# Patient Record
Sex: Female | Born: 1970 | Race: Black or African American | Hispanic: No | Marital: Married | Smoking: Former smoker
Health system: Southern US, Community
[De-identification: ages and names within clinical notes are randomized; demographics above are authoritative.]

## PROBLEM LIST (undated history)

## (undated) ENCOUNTER — Inpatient Hospital Stay (HOSPITAL_COMMUNITY): Payer: Self-pay

## (undated) DIAGNOSIS — I8390 Asymptomatic varicose veins of unspecified lower extremity: Secondary | ICD-10-CM

## (undated) DIAGNOSIS — F32A Depression, unspecified: Secondary | ICD-10-CM

## (undated) DIAGNOSIS — F329 Major depressive disorder, single episode, unspecified: Secondary | ICD-10-CM

## (undated) DIAGNOSIS — Z8719 Personal history of other diseases of the digestive system: Secondary | ICD-10-CM

## (undated) DIAGNOSIS — G4730 Sleep apnea, unspecified: Secondary | ICD-10-CM

## (undated) DIAGNOSIS — K219 Gastro-esophageal reflux disease without esophagitis: Secondary | ICD-10-CM

## (undated) DIAGNOSIS — E559 Vitamin D deficiency, unspecified: Secondary | ICD-10-CM

## (undated) DIAGNOSIS — M1990 Unspecified osteoarthritis, unspecified site: Secondary | ICD-10-CM

## (undated) DIAGNOSIS — O139 Gestational [pregnancy-induced] hypertension without significant proteinuria, unspecified trimester: Secondary | ICD-10-CM

## (undated) DIAGNOSIS — D649 Anemia, unspecified: Secondary | ICD-10-CM

## (undated) HISTORY — DX: Vitamin D deficiency, unspecified: E55.9

## (undated) HISTORY — DX: Anemia, unspecified: D64.9

## (undated) HISTORY — PX: HERNIA REPAIR: SHX51

## (undated) HISTORY — PX: TOE SURGERY: SHX1073

## (undated) HISTORY — DX: Asymptomatic varicose veins of unspecified lower extremity: I83.90

## (undated) HISTORY — PX: KNEE SURGERY: SHX244

---

## 1997-11-17 ENCOUNTER — Inpatient Hospital Stay (HOSPITAL_COMMUNITY): Admission: AD | Admit: 1997-11-17 | Discharge: 1997-11-17 | Payer: Self-pay | Admitting: *Deleted

## 1999-06-01 ENCOUNTER — Encounter: Admission: RE | Admit: 1999-06-01 | Discharge: 1999-06-01 | Payer: Self-pay | Admitting: Obstetrics

## 1999-08-15 ENCOUNTER — Inpatient Hospital Stay (HOSPITAL_COMMUNITY): Admission: AD | Admit: 1999-08-15 | Discharge: 1999-08-15 | Payer: Self-pay | Admitting: *Deleted

## 1999-08-30 ENCOUNTER — Other Ambulatory Visit: Admission: RE | Admit: 1999-08-30 | Discharge: 1999-08-30 | Payer: Self-pay | Admitting: Obstetrics and Gynecology

## 1999-12-12 ENCOUNTER — Encounter (INDEPENDENT_AMBULATORY_CARE_PROVIDER_SITE_OTHER): Payer: Self-pay | Admitting: Specialist

## 1999-12-12 ENCOUNTER — Other Ambulatory Visit: Admission: RE | Admit: 1999-12-12 | Discharge: 1999-12-12 | Payer: Self-pay | Admitting: Obstetrics and Gynecology

## 2000-05-21 ENCOUNTER — Other Ambulatory Visit: Admission: RE | Admit: 2000-05-21 | Discharge: 2000-05-21 | Payer: Self-pay | Admitting: Obstetrics and Gynecology

## 2001-07-24 ENCOUNTER — Emergency Department (HOSPITAL_COMMUNITY): Admission: EM | Admit: 2001-07-24 | Discharge: 2001-07-24 | Payer: Self-pay | Admitting: Emergency Medicine

## 2001-08-29 ENCOUNTER — Encounter: Payer: Self-pay | Admitting: Emergency Medicine

## 2001-08-29 ENCOUNTER — Emergency Department (HOSPITAL_COMMUNITY): Admission: EM | Admit: 2001-08-29 | Discharge: 2001-08-29 | Payer: Self-pay | Admitting: Emergency Medicine

## 2002-07-30 ENCOUNTER — Ambulatory Visit (HOSPITAL_COMMUNITY): Admission: RE | Admit: 2002-07-30 | Discharge: 2002-07-30 | Payer: Self-pay | Admitting: Obstetrics & Gynecology

## 2002-07-30 ENCOUNTER — Encounter: Payer: Self-pay | Admitting: Obstetrics & Gynecology

## 2005-05-01 ENCOUNTER — Other Ambulatory Visit: Admission: RE | Admit: 2005-05-01 | Discharge: 2005-05-01 | Payer: Self-pay | Admitting: Obstetrics and Gynecology

## 2005-05-09 ENCOUNTER — Ambulatory Visit: Payer: Self-pay | Admitting: Internal Medicine

## 2005-05-14 ENCOUNTER — Ambulatory Visit: Payer: Self-pay | Admitting: Internal Medicine

## 2006-07-30 ENCOUNTER — Ambulatory Visit: Payer: Self-pay | Admitting: Internal Medicine

## 2006-07-30 LAB — CONVERTED CEMR LAB
AST: 22 units/L (ref 0–37)
Albumin: 3.5 g/dL (ref 3.5–5.2)
Basophils Absolute: 0.1 10*3/uL (ref 0.0–0.1)
Chloride: 108 meq/L (ref 96–112)
Cholesterol: 200 mg/dL (ref 0–200)
Creatinine, Ser: 0.7 mg/dL (ref 0.4–1.2)
Eosinophils Relative: 1.5 % (ref 0.0–5.0)
Glucose, Bld: 90 mg/dL (ref 70–99)
HCT: 40.2 % (ref 36.0–46.0)
Neutrophils Relative %: 58 % (ref 43.0–77.0)
RBC: 4.19 M/uL (ref 3.87–5.11)
RDW: 12.1 % (ref 11.5–14.6)
Sodium: 141 meq/L (ref 135–145)
Total Bilirubin: 0.6 mg/dL (ref 0.3–1.2)
WBC: 10.4 10*3/uL (ref 4.5–10.5)

## 2007-03-03 ENCOUNTER — Ambulatory Visit: Payer: Self-pay

## 2007-03-03 ENCOUNTER — Ambulatory Visit: Payer: Self-pay | Admitting: Internal Medicine

## 2007-03-03 DIAGNOSIS — I8000 Phlebitis and thrombophlebitis of superficial vessels of unspecified lower extremity: Secondary | ICD-10-CM | POA: Insufficient documentation

## 2007-04-28 ENCOUNTER — Ambulatory Visit: Payer: Self-pay | Admitting: Vascular Surgery

## 2007-09-09 ENCOUNTER — Ambulatory Visit: Payer: Self-pay | Admitting: Vascular Surgery

## 2008-08-17 ENCOUNTER — Emergency Department (HOSPITAL_COMMUNITY): Admission: EM | Admit: 2008-08-17 | Discharge: 2008-08-17 | Payer: Self-pay | Admitting: Emergency Medicine

## 2008-08-21 ENCOUNTER — Emergency Department (HOSPITAL_COMMUNITY): Admission: EM | Admit: 2008-08-21 | Discharge: 2008-08-21 | Payer: Self-pay | Admitting: Family Medicine

## 2008-08-21 ENCOUNTER — Encounter (INDEPENDENT_AMBULATORY_CARE_PROVIDER_SITE_OTHER): Payer: Self-pay | Admitting: *Deleted

## 2008-08-22 ENCOUNTER — Inpatient Hospital Stay (HOSPITAL_COMMUNITY): Admission: EM | Admit: 2008-08-22 | Discharge: 2008-08-25 | Payer: Self-pay | Admitting: Emergency Medicine

## 2008-08-24 ENCOUNTER — Encounter (INDEPENDENT_AMBULATORY_CARE_PROVIDER_SITE_OTHER): Payer: Self-pay | Admitting: *Deleted

## 2008-08-25 ENCOUNTER — Encounter (INDEPENDENT_AMBULATORY_CARE_PROVIDER_SITE_OTHER): Payer: Self-pay | Admitting: *Deleted

## 2008-09-23 ENCOUNTER — Ambulatory Visit: Payer: Self-pay | Admitting: Internal Medicine

## 2008-09-24 DIAGNOSIS — E876 Hypokalemia: Secondary | ICD-10-CM

## 2008-09-24 DIAGNOSIS — F17200 Nicotine dependence, unspecified, uncomplicated: Secondary | ICD-10-CM | POA: Insufficient documentation

## 2008-09-24 DIAGNOSIS — A0472 Enterocolitis due to Clostridium difficile, not specified as recurrent: Secondary | ICD-10-CM | POA: Insufficient documentation

## 2009-02-28 ENCOUNTER — Emergency Department (HOSPITAL_COMMUNITY): Admission: EM | Admit: 2009-02-28 | Discharge: 2009-02-28 | Payer: Self-pay | Admitting: Emergency Medicine

## 2009-06-01 ENCOUNTER — Emergency Department (HOSPITAL_COMMUNITY): Admission: EM | Admit: 2009-06-01 | Discharge: 2009-06-01 | Payer: Self-pay | Admitting: Emergency Medicine

## 2010-06-26 LAB — CBC
HCT: 43.7 % (ref 36.0–46.0)
MCHC: 33.5 g/dL (ref 30.0–36.0)
Platelets: 212 10*3/uL (ref 150–400)
RDW: 13.2 % (ref 11.5–15.5)

## 2010-06-26 LAB — POCT I-STAT, CHEM 8
Calcium, Ion: 1.14 mmol/L (ref 1.12–1.32)
Chloride: 108 mEq/L (ref 96–112)
HCT: 45 % (ref 36.0–46.0)
Hemoglobin: 15.3 g/dL — ABNORMAL HIGH (ref 12.0–15.0)
TCO2: 27 mmol/L (ref 0–100)

## 2010-07-11 LAB — DIFFERENTIAL
Basophils Absolute: 0 10*3/uL (ref 0.0–0.1)
Basophils Absolute: 0 10*3/uL (ref 0.0–0.1)
Basophils Relative: 0 % (ref 0–1)
Basophils Relative: 0 % (ref 0–1)
Basophils Relative: 0 % (ref 0–1)
Eosinophils Absolute: 0 10*3/uL (ref 0.0–0.7)
Eosinophils Absolute: 0.1 10*3/uL (ref 0.0–0.7)
Eosinophils Absolute: 0.1 10*3/uL (ref 0.0–0.7)
Eosinophils Relative: 1 % (ref 0–5)
Eosinophils Relative: 1 % (ref 0–5)
Lymphocytes Relative: 18 % (ref 12–46)
Lymphs Abs: 1.9 10*3/uL (ref 0.7–4.0)
Lymphs Abs: 1.6 10*3/uL (ref 0.7–4.0)
Lymphs Abs: 1.8 10*3/uL (ref 0.7–4.0)
Monocytes Absolute: 0.2 10*3/uL (ref 0.1–1.0)
Monocytes Absolute: 0.2 10*3/uL (ref 0.1–1.0)
Monocytes Absolute: 0.7 10*3/uL (ref 0.1–1.0)
Monocytes Relative: 3 % (ref 3–12)
Monocytes Relative: 2 % — ABNORMAL LOW (ref 3–12)
Neutro Abs: 7.8 10*3/uL — ABNORMAL HIGH (ref 1.7–7.7)
Neutrophils Relative %: 77 % (ref 43–77)
Neutrophils Relative %: 78 % — ABNORMAL HIGH (ref 43–77)

## 2010-07-11 LAB — BASIC METABOLIC PANEL
CO2: 23 mEq/L (ref 19–32)
CO2: 24 mEq/L (ref 19–32)
CO2: 26 mEq/L (ref 19–32)
Calcium: 8.2 mg/dL — ABNORMAL LOW (ref 8.4–10.5)
Chloride: 106 mEq/L (ref 96–112)
Chloride: 110 mEq/L (ref 96–112)
Creatinine, Ser: 0.56 mg/dL (ref 0.4–1.2)
GFR calc Af Amer: >60 mL/min (ref >60)
GFR calc Af Amer: >60 mL/min (ref >60)
GFR calc Af Amer: >60 mL/min (ref >60)
GFR calc non Af Amer: >60 mL/min (ref >60)
Glucose, Bld: 99 mg/dL (ref 70–99)
Potassium: 3 mEq/L — ABNORMAL LOW (ref 3.5–5.1)
Potassium: 4.3 mEq/L (ref 3.5–5.1)
Sodium: 139 mEq/L (ref 135–145)
Sodium: 139 mEq/L (ref 135–145)
Sodium: 142 mEq/L (ref 135–145)

## 2010-07-11 LAB — POCT PREGNANCY, URINE: Preg Test, Ur: NEGATIVE

## 2010-07-11 LAB — PROTIME-INR
INR: 1.1 (ref 0.00–1.49)
Prothrombin Time: 14.5 seconds (ref 11.6–15.2)

## 2010-07-11 LAB — POTASSIUM
Potassium: 2.8 mEq/L — ABNORMAL LOW (ref 3.5–5.1)
Potassium: 3.2 mEq/L — ABNORMAL LOW (ref 3.5–5.1)

## 2010-07-11 LAB — COMPREHENSIVE METABOLIC PANEL
ALT: 17 U/L (ref 0–35)
ALT: 13 U/L (ref 0–35)
AST: 18 U/L (ref 0–37)
AST: 17 U/L (ref 0–37)
Albumin: 3.4 g/dL — ABNORMAL LOW (ref 3.5–5.2)
Albumin: 2.4 g/dL — ABNORMAL LOW (ref 3.5–5.2)
Alkaline Phosphatase: 70 U/L (ref 39–117)
Alkaline Phosphatase: 58 U/L (ref 39–117)
Calcium: 8.7 mg/dL (ref 8.4–10.5)
Chloride: 101 mEq/L (ref 96–112)
GFR calc Af Amer: >60 mL/min (ref >60)
GFR calc Af Amer: >60 mL/min (ref >60)
Potassium: 3.4 mEq/L — ABNORMAL LOW (ref 3.5–5.1)
Potassium: 2.4 mEq/L — CL (ref 3.5–5.1)
Sodium: 138 mEq/L (ref 135–145)
Total Bilirubin: 0.7 mg/dL (ref 0.3–1.2)
Total Protein: 6.1 g/dL (ref 6.0–8.3)

## 2010-07-11 LAB — URINALYSIS, ROUTINE W REFLEX MICROSCOPIC
Glucose, UA: NEGATIVE mg/dL
Hgb urine dipstick: NEGATIVE
Ketones, ur: 15 mg/dL — AB
Leukocytes, UA: NEGATIVE
Nitrite: NEGATIVE
Protein, ur: NEGATIVE mg/dL
Urobilinogen, UA: 0.2 mg/dL (ref 0.0–1.0)
pH: 5.5 (ref 5.0–8.0)
pH: 5.5 (ref 5.0–8.0)

## 2010-07-11 LAB — URINE MICROSCOPIC-ADD ON

## 2010-07-11 LAB — RAPID URINE DRUG SCREEN, HOSP PERFORMED
Benzodiazepines: NOT DETECTED
Cocaine: NOT DETECTED
Tetrahydrocannabinol: NOT DETECTED

## 2010-07-11 LAB — LIPID PANEL
Cholesterol: 116 mg/dL (ref 0–200)
HDL: 27 mg/dL — ABNORMAL LOW (ref >39)
LDL Cholesterol: 77 mg/dL (ref 0–99)
Triglycerides: 62 mg/dL (ref <150)

## 2010-07-11 LAB — CBC
HCT: 35.9 % — ABNORMAL LOW (ref 36.0–46.0)
HCT: 37.2 % (ref 36.0–46.0)
Hemoglobin: 14.5 g/dL (ref 12.0–15.0)
Hemoglobin: 12.4 g/dL (ref 12.0–15.0)
Hemoglobin: 12.8 g/dL (ref 12.0–15.0)
MCHC: 34 g/dL (ref 30.0–36.0)
MCHC: 34.5 g/dL (ref 30.0–36.0)
MCV: 96.2 fL (ref 78.0–100.0)
Platelets: 218 10*3/uL (ref 150–400)
Platelets: 209 10*3/uL (ref 150–400)
Platelets: 247 10*3/uL (ref 150–400)
RBC: 3.57 MIL/uL — ABNORMAL LOW (ref 3.87–5.11)
RBC: 3.73 MIL/uL — ABNORMAL LOW (ref 3.87–5.11)
RBC: 3.86 MIL/uL — ABNORMAL LOW (ref 3.87–5.11)
RDW: 13.7 % (ref 11.5–15.5)
RDW: 13.2 % (ref 11.5–15.5)
WBC: 10.1 10*3/uL (ref 4.0–10.5)
WBC: 8.3 10*3/uL (ref 4.0–10.5)
WBC: 8.4 10*3/uL (ref 4.0–10.5)

## 2010-07-11 LAB — POCT I-STAT, CHEM 8
Creatinine, Ser: 0.9 mg/dL (ref 0.4–1.2)
HCT: 42 % (ref 36.0–46.0)
Hemoglobin: 14.3 g/dL (ref 12.0–15.0)
Sodium: 137 mEq/L (ref 135–145)
TCO2: 27 mmol/L (ref 0–100)

## 2010-07-11 LAB — STOOL CULTURE

## 2010-07-11 LAB — TSH: TSH: 0.765 u[IU]/mL (ref 0.350–4.500)

## 2010-07-11 LAB — URINE CULTURE: Colony Count: 70000

## 2010-07-11 LAB — GIARDIA/CRYPTOSPORIDIUM SCREEN(EIA): Giardia Screen - EIA: NEGATIVE

## 2010-07-11 LAB — FECAL LACTOFERRIN, QUANT: Fecal Lactoferrin: POSITIVE

## 2010-07-11 LAB — APTT: aPTT: 25 seconds (ref 24–37)

## 2010-07-11 LAB — CLOSTRIDIUM DIFFICILE EIA

## 2010-08-15 NOTE — Procedures (Signed)
LOWER EXTREMITY VENOUS REFLUX EXAM   INDICATION:  Left leg varicose veins, pain and edema.   EXAM:  Using color-flow imaging and pulse Doppler spectral analysis, the  left common femoral, superficial femoral, popliteal, posterior tibial,  greater and lesser saphenous veins are evaluated.  There is no evidence  suggesting deep venous insufficiency in the left lower extremity.   The left saphenofemoral junction is not competent.  The left GSV is  competent with the caliber as described below.   The left proximal short saphenous vein demonstrates incompetency.   SAPHENOFEMORAL JUNCTION DIAMETER LEFT:  0.74 cm   GSV Diameter (used if found to be incompetent only)                                            Right    Left  Proximal Greater Saphenous Vein           cm       0.43 cm  Proximal-to-mid-thigh                     cm       0.46 cm  Mid thigh                                 cm       0.50 cm  Mid-distal thigh                          cm       0.35 (depth 1.68) cm  Distal thigh                              cm       0.49 (depth 2.39) cm  Knee                                      cm       0.49 cm   IMPRESSION:  1. Left greater saphenous vein reflux is identified with the caliber      ranging from 0.49 cm to 0.74 cm knee to groin.  2. The left greater saphenous vein is not aneurysmal.  3. The left greater saphenous vein is not tortuous.  4. The deep venous system is competent.  5. The left lesser saphenous vein is not competent.  6. Thrombosed varicosities were detected in the proximal thigh at the      area of discomfort.   ___________________________________________  Quita Skye. Hart Rochester, M.D.   DP/MEDQ  D:  04/28/2007  T:  04/29/2007  Job:  [id number]

## 2010-08-15 NOTE — Discharge Summary (Signed)
[name], [name]                ACCOUNT NO.:  [id number]   MEDICAL RECORD NO.:  [id number]          PATIENT TYPE:  INP   LOCATION:  5159                         FACILITY:  [facility]   PHYSICIAN:  Monte Fantasia, MD  DATE OF BIRTH:  [birth year]   DATE OF ADMISSION:  08/21/2008  DATE OF DISCHARGE:  08/25/2008                               DISCHARGE SUMMARY   PRIMARY CARE PHYSICIAN:  Unassigned.  The patient does not have a  primary care physician and insurance, will need to follow up with  [facility].   DISCHARGE DIAGNOSES:  1. Clostridium difficile colitis.  2. Severe hypokalemia.  3. Tobacco abuse.   COURSE DURING THE HOSPITAL STAY:  89. [name] patient has come and was      admitted on Aug 22, 2008, with complaints of nausea, vomiting, and      diarrhea for 10 days and with worsening over the period of time.      The CT scan of the abdomen done in the ED showed thickening of the      sigmoid colon and proximal rectum slight perirectal thickening and      edema more consistent with proctitis, acute colitis, or      inflammatory bowel disease.  The patient's stool C. diff sent on      admission was positive and hence the patient was started on Flagyl      orally for the same.  The diarrhea improved well through the stay      in the hospital, and at present, the patient has no more episodes      of diarrhea.  The patient has been scheduled for a GI appointment      with Dr. Marina Goodell as an outpatient on September 27, 2008, at 2:45 p.m. for      possible colonoscopy.  2. Hypokalemia.  The patient's potassium was significantly low with      values up to 2.8.  The patient was resuscitated with the same.  At      present, the patient's potassium has been 4 and has no more      episodes, can be discharged on potassium 20 mEq daily for 14 days.   LABORATORIES DONE PRIOR TO DISCHARGE:  Total WBC 7.1, hemoglobin 12.4,  hematocrit 35.9, platelets 224.  Sodium 139,  potassium 4.0, chloride  110, bicarb 24, glucose 99, BUN less than 1, creatinine 0.64, calcium of  8.4, magnesium 2.2.  Drug screen has been negative.  UA has been  negative.  Giardia screen negative.  Cryptosporidium is negative.  Stool  for C. Diff, first one was positive on Aug 22, 2008, the second one done  on Aug 23, 2008, was negative.  Urine cultures have been not significant  less than 70,000 colonies of lactobacillus species.  Fecal lactoferrin  is positive.  Moderate yeast in stool cultures.   RADIOLOGICAL INVESTIGATIONS DONE DURING THE STAY IN THE HOSPITAL:  Chest  x-ray done on Aug 21, 2008.  Atelectasis and scarring at the bases.  No  definite active infiltrates.  CT of the  abdomen and pelvis done on  admission on Aug 21, 2008.  Impression, acute colitis, inflammatory  bowel disease, for example ulcerative colitis would be needing  consideration.  CT pelvis, impression consistent with colitis and  proctitis.   DISPOSITION:  The patient at present is medically stable to be  discharged.  We would discharge the patient on Flagyl for a total of 14  days to complete the course.  The patient also has an outpatient  scheduled appointment with Dr. Marina Goodell for possible colonoscopy.  We will  also discharge the patient on potassium p.o. 20 mEq daily for 14 days.   MEDICATIONS UPON DISCHARGE:  1. Metronidazole 500 mg p.o. 3 times a day for 11 days.  2. Potassium chloride 20 mEq p.o. daily for 14 days.   Total time for discharge 35 minutes.      Monte Fantasia, MD  Electronically Signed     MP/MEDQ  D:  08/25/2008  T:  08/26/2008  Job:  [id number]

## 2010-08-15 NOTE — Assessment & Plan Note (Signed)
OFFICE VISIT   ABRAHAM, MARGULIES  DOB:  10-05-1970                                       09/09/2007  CHART#:[id number]   The patient returns today after 3 months for further evaluation of her  venous insufficiency of the left leg.  She has had acute  thrombophlebitis in November of 2008 with thrombosed varicosities and  has had pain and discomfort from the venous hypertension in her left  thigh and calf.  She has also had distal swelling in the left leg over  the last several years.  The elastic compression stockings have not  relieved her aching and throbbing discomfort in the thigh and calf.  She  has tried pain medication (ibuprofen) plus elevation of the legs with no  success.  This is affecting her daily living as, each time she tries to  ambulate, she does have discomfort.  Her venous duplex revealed gross  reflux at the saphenofemoral junction on the left as well as the left  greater saphenous vein from the groin to the knee, and I feel her best  treatment would be laser ablation of her left greater saphenous vein.  I  have discussed this with her and we will proceed with precertification  for the procedure to treat these symptomatic varicosities secondary to  high venous pressures.   Quita Skye Hart Rochester, M.D.  Electronically Signed   JDL/MEDQ  D:  09/09/2007  T:  09/10/2007  Job:  [id number]

## 2010-08-15 NOTE — H&P (Signed)
Renee, [name]                ACCOUNT NO.:  [id number]   MEDICAL RECORD NO.:  [id number]          PATIENT TYPE:  INP   LOCATION:  5159                         FACILITY:  [facility]   PHYSICIAN:  Manus Gunning, MD      DATE OF BIRTH:  1970-04-16   DATE OF ADMISSION:  08/21/2008  DATE OF DISCHARGE:                              HISTORY & PHYSICAL   CHIEF COMPLAINT:  Nausea, vomiting and diarrhea x10 days.   HISTORY OF PRESENT ILLNESS:  [name] is a 40 year old African American  female with the complaint of diarrhea, nausea and vomiting for  approximately 10 days.  She claims that this has been gradually  worsening over a period of time and she is now vomiting at least 3 times  daily.  She also complains of blood associated with the diarrhea upon  wiping after passing stool. She describes no recent history of  antibiotics, no recent travel history, no history of inflammatory bowel  syndrome in the family, no ingestion of unusual foods and no exposure  history.  CT scan of the abdomen was performed in the emergency  department which demonstrated the patient to have thickening of the wall  of the sigmoid colon and proximal rectum, slight perirectal thickening  and edema, no free fluid, most consistent with colitis or proctitis,  acute colitis, possible inflammatory bowel disease.   The patient has been seen in the ER approximately 2 times now and has  been prescribed Cipro as an outpatient despite which she continues to  have symptoms.  She is also complaining of fever at home, measured 100.5  degrees Fahrenheit.  She does not have leukocytosis, white blood cell  count is 10,100.   PAST MEDICAL HISTORY:  None.   PAST SURGICAL HISTORY:  Bilateral first toe reduction.   ALLERGIES:  No known drug allergies.   FAMILY HISTORY:  Mother had diabetes, father had heart disease.   SOCIAL HISTORY:  The patient smokes approximately 1 pack in 4 days,  no  illicits, no alcohol.   HOME  MEDICATIONS:  None.  She was recently prescribed Cipro 500 mg p.o.  b.i.d. which she has been taking.   REVIEW OF SYSTEMS:  Essentially, a 14-point review of systems is  performed.  The patient denies headache, syncope, presyncope, tinnitus,  lightheadedness, odynophagia, no dysphagia.  Positive nausea and  vomiting.  Positive abdominal pain, cramping in nature, 8-10, no  radiation.  No aggravating or relieving factors.  Spontaneous onset and  resolution.  No chest pain, palpitations, PND, orthopnea. No shortness  of breath, cough, expectoration.  Positive diarrhea, no constipation.  Positive bright red blood per rectum, especially during wiping  No  melenic stools.  No dysuria, polyuria or hematuria.  No musculoskeletal  complaints.   PHYSICAL EXAMINATION:  VITAL SIGNS:  At the time of presentation,  temperature 97.9, heart rate 92, respiratory rate 18, blood pressure  132/87, O2 saturation is 97% in room air.  GENERAL:  Well-developed, well-nourished African American lady resting  in bed comfortably in no apparent distress.  HEENT:  Normocephalic, atraumatic.  Moist oral  mucosa.  No thrush,  erythema or postnasal drip.  Eyes anicteric.  Extraocular muscles are  intact.  Pupils are equal, react to light and accommodation.  CARDIOVASCULAR:  S1 and S2 normal, regular rate and rhythm.  No murmurs,  rubs or gallops.  RESPIRATORY:  Air entry bilaterally equal.  No rales, rhonchi or  wheezing appreciated.  ABDOMEN:  Soft and nontender.  Nondistended.  Positive bowel sounds  which are hypoactive.  No organomegaly.  EXTREMITIES:  No clubbing, cyanosis, or edema.  Positive bilateral  dorsalis pedis pulses.  CNS:  Alert and oriented x3.  Cranial nerves II through XII grossly  intact.  Power, sensation and reflexes bilaterally symmetrical.  SKIN:  No breakdown, swelling, ulceration or masses.  HEMATOLOGY/ONCOLOGY:  No palpable lymphadenopathy, ecchymoses, bruising  or petechia.   LABORATORY  DATA:  Potassium 2.8.  Chest x-ray reviewed by self  demonstrates atelectasis, no active infiltrates.  CT of the abdomen and  pelvis as described above. INR 1.1, prothrombin time 14.5. WBC 10.100,  hemoglobin 14.1, hematocrit 41.0 and platelet count 247,000.  Polymorphs  77.  Sodium 137, potassium 2.7, chloride 100, BUN less than 2,  creatinine 0.9, glucose 91, calcium 1.03, pCO2 27, hemoglobin 14.3,  hematocrit 42.  Urinalysis essentially negative.  Urine pregnancy test  negative.   ASSESSMENT AND PLAN:  Colitis.  Apparently gastrointestinal MD is to see  the patient in the emergency department.  At this time, continue Cipro  500 mg p.o. q.12 hours.  Obtain stool studies, ova and parasites, fecal  wbc's, Clostridium difficile and stool culture and sensitivity.  Also  will check hemoglobin and hematocrit at 2 a.m.  If hemoglobin is less  than 8 grams percent, patient is to be transfused 2 units of packed red  blood cells.  She is maintained n.p.o. and started on normal saline at  150 cc/hr.  She may be allowed ice chips and medications.  Prescribed  morphine 2 mg IV q.6 hours for p.r.n. pain and will replete potassium  with 40 mEq IV and 400 cc of normal saline over 4 hours.  Will start  proton pump inhibitor 40 mg, Protonix, IV q.12 hours and place patient  on sequential compression devices for deep vein thrombosis prophylaxis.      Manus Gunning, MD  Electronically Signed     SP/MEDQ  D:  08/21/2008  T:  08/22/2008  Job:  [id number]

## 2010-08-15 NOTE — Consult Note (Signed)
VASCULAR SURGERY CONSULTATION   [name], [name]  DOB:  [birth year]                                       04/28/2007  CHART#:[id number]   Patient is a 40 year old female referred for consultation by Dr.  Amador Cunas for venous insufficiency of the left lower extremity.  This  lady in November, 2008 developed tenderness and knots on the medial  aspect of her left leg which were found to be superficial  thrombophlebitis by duplex scanning.  She had no history of deep venous  thrombosis or previous thrombophlebitis but had noted these knots along  the medial aspect of her left thigh in the past, although they had not  caused any symptoms up to that point.  She has had some mild swelling  distally in the left ankle over the last several years, compared to the  right side.  She has had no thrombotic episodes of deep venous  thrombosis or pulmonary emboli, has not worn elastic compression  stockings, has not tried elevation of the legs but has tried pain  medication, which has helped during her painful episodes.   Past medical history is negative for coronary artery disease, diabetes,  hypertension, hyperlipidemia, stroke, or COPD.   Previous surgery includes bilateral surgery on her toes.   FAMILY HISTORY:  Positive for hypertension and diabetes in her mother.  Negative for coronary artery disease and stroke.   SOCIAL HISTORY:  She is single.  Works as a Dietitian.  Occasionally smokes cigarettes.  She does not use alcohol.   REVIEW OF SYSTEMS:  Some discomfort in her legs with walking and  occasional headaches but otherwise is unremarkable.   ALLERGIES:  None.   MEDICATIONS:  None.   PHYSICAL EXAMINATION:  Blood pressure is 130/70.  Heart rate is 80.  Respirations are 14.  Generally, she is a middle-aged female who is in  no apparent distress.  She is alert and oriented x3.  Neck is supple, 3+  carotid pulses palpable.  No bruits are audible.  Neurologic  exam is  normal.  No palpable adenopathy in the neck.  Chest:  Clear to  auscultation.  Cardiovascular:  Regular rhythm with no murmurs.  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  This has not  changed in character.  Abdomen is soft and nontender with no palpable  masses.  She has 3+ femoral popliteal and dorsalis pedis pulses  palpable.  There is mild distal edema in the left ankle, compared to the  right side.  No ulceration or hyperpigmentations noted.  She does have  palpable, thrombosed varices in the medial thigh along the course of the  greater saphenous vein in the left leg with no varicosities noted on the  right side.   Venous duplex exam was performed in our office today, and there is gross  reflux at the saphenofemoral junction on the left side as well as reflux  in the left greater saphenous vein from the groin to the knee.  She also  has reflux in the left small saphenous vein with some varicosities in  this area.  Deep venous system is widely patent with no evidence of deep  venous  obstruction.   I feel she does have symptomatic venous insufficiency which has caused  this thrombophlebitis in the left leg, and she would be a good candidate  for laser ablation of the left greater saphenous vein to prevent further  episodes of thrombophlebitis and to eliminate the reflux.  We will treat  her initially conservatively with elastic compression stockings (long  leg graduated compression) as well as pain medication and elevation.  She will return in three months for further evaluation, unless she  develops further episodes of thrombophlebitis in the interim.   Quita Skye Hart Rochester, M.D.  Electronically Signed  JDL/MEDQ  D:  04/28/2007  T:  04/29/2007  Job:  [id number]   cc:   Gordy Savers, MD

## 2010-08-18 NOTE — Assessment & Plan Note (Signed)
[facility]                            [facility] OFFICE NOTE   NAME:[name], Renee BURPEE                       MRN:          [id number]  DATE:07/30/2006                            DOB:          06-Apr-1970    A 40 year old African-American female seen today for a wellness exam.  She has seen Dr. Dareen Piano earlier this year for her gynecologic care.  She does quite well.  She complains of occasional lower extremity edema,  has a history of headaches that have not really been bothersome lately.  She is on Chantix and has been off tobacco for a few weeks.   FAMILY HISTORY:  Unchanged.   EXAM:  Reveals an overweight black female.  Weight is down 11 pounds  compared to a year ago.  Blood pressure was down to 110/74 on repeat.  FUNDI, EAR, NOSE AND THROAT:  Clear.  NECK:  Unremarkable.  No adenopathy or thyroid enlargement.  CHEST:  Clear.  CARDIOVASCULAR EXAM:  Normal heart sounds, no murmurs.  ABDOMEN:  Obese, soft and nontender, no organomegaly.  EXTREMITIES:  No significant edema.  Peripheral pulses intact.  NEURO EXAM:  Negative.   IMPRESSION:  1. Exogenous obesity.  2. Otherwise unremarkable clinical exam.   DISPOSITION:  1. Laboratory update will be reviewed.  2. Return in 2 years for followup.     Gordy Savers, MD  Electronically Signed    PFK/MedQ  DD: 07/30/2006  DT: 07/30/2006  Job #: [id number]

## 2011-03-16 ENCOUNTER — Emergency Department (INDEPENDENT_AMBULATORY_CARE_PROVIDER_SITE_OTHER): Payer: Self-pay

## 2011-03-16 ENCOUNTER — Emergency Department (INDEPENDENT_AMBULATORY_CARE_PROVIDER_SITE_OTHER)
Admission: EM | Admit: 2011-03-16 | Discharge: 2011-03-16 | Disposition: A | Discharge status: Home / Self Care | Payer: Self-pay | Source: Home / Self Care | Attending: Family Medicine | Admitting: Family Medicine

## 2011-03-16 DIAGNOSIS — J019 Acute sinusitis, unspecified: Secondary | ICD-10-CM

## 2011-03-16 MED ORDER — AMOXICILLIN-POT CLAVULANATE 875-125 MG PO TABS: 1.0000 | ORAL_TABLET | Freq: Two times a day (BID) | ORAL | Status: AC

## 2011-03-16 MED ORDER — IPRATROPIUM BROMIDE 0.06 % NA SOLN: 1.0000 | Freq: Four times a day (QID) | NASAL | Status: DC

## 2011-03-16 NOTE — ED Provider Notes (Signed)
History     CSN: [id number] Arrival date & time: 03/16/2011 12:12 PM   First MD Initiated Contact with Patient 03/16/11 1208      Chief Complaint  Patient presents with  . Cough  . URI    (Consider location/radiation/quality/duration/timing/severity/associated sxs/prior treatment) Patient is a 40 y.o. female presenting with cough and URI. The history is provided by the patient.  Cough This is a new problem. The current episode started yesterday. The problem has not changed since onset.The cough is productive of purulent sputum. There has been no fever. Associated symptoms include ear congestion, rhinorrhea and sore throat. She has tried decongestants and cough syrup for the symptoms.  URI The primary symptoms include sore throat and cough.  Symptoms associated with the illness include congestion and rhinorrhea.    History reviewed. No pertinent past medical history.  Past Surgical History  Procedure Date  . Foot surgery     No family history on file.  History  Substance Use Topics  . Smoking status: Current Everyday Smoker    Types: Cigarettes  . Smokeless tobacco: Not on file  . Alcohol Use: No    OB History    Grav Para Term Preterm Abortions TAB SAB Ect Mult Living                  Review of Systems  Constitutional: Negative.   HENT: Positive for congestion, sore throat, rhinorrhea and postnasal drip.   Respiratory: Positive for cough.   Skin: Negative.     Allergies  Review of patient's allergies indicates no known allergies.  Home Medications   Current Outpatient Rx  Name Route Sig Dispense Refill  . AMOXICILLIN-POT CLAVULANATE 875-125 MG PO TABS Oral Take 1 tablet by mouth 2 (two) times daily. 20 tablet 0  . IPRATROPIUM BROMIDE 0.06 % NA SOLN Nasal Place 1 spray into the nose 4 (four) times daily. 15 mL 12    BP 148/93  Pulse 98  Temp(Src) 97.9 F (36.6 C) (Oral)  Resp 18  SpO2 98%  LMP 02/28/2011  Physical Exam  Constitutional: She  appears well-developed and well-nourished.  HENT:  Head: Normocephalic.  Right Ear: External ear normal.  Left Ear: External ear normal.  Mouth/Throat: Oropharynx is clear and moist.  Eyes: Conjunctivae and EOM are normal. Pupils are equal, round, and reactive to light.  Neck: Normal range of motion. Neck supple.  Pulmonary/Chest: Effort normal and breath sounds normal.  Abdominal: Soft. Bowel sounds are normal.  Lymphadenopathy:    She has no cervical adenopathy.  Skin: Skin is warm and dry.    ED Course  Procedures (including critical care time)  Labs Reviewed - No data to display No results found.   1. Sinusitis acute       MDM  X-rays reviewed and report per radiologist.         Barkley Bruns, MD 03/16/11 [phone]

## 2011-03-16 NOTE — ED Notes (Signed)
C/o head congestion, green nasal secretions, ears ringing and persistent productive cough of green sputum for 5 weeks.  States she has taken multiple OTC cold meds without relief.  Also notes rash to both lower legs since starting with cold sx

## 2011-08-04 ENCOUNTER — Encounter (HOSPITAL_COMMUNITY): Payer: Self-pay | Admitting: Emergency Medicine

## 2011-08-04 ENCOUNTER — Emergency Department (HOSPITAL_COMMUNITY)
Admission: EM | Admit: 2011-08-04 | Discharge: 2011-08-04 | Disposition: A | Discharge status: Home / Self Care | Payer: Self-pay | Attending: Emergency Medicine | Admitting: Emergency Medicine

## 2011-08-04 DIAGNOSIS — L500 Allergic urticaria: Secondary | ICD-10-CM | POA: Insufficient documentation

## 2011-08-04 DIAGNOSIS — T7840XA Allergy, unspecified, initial encounter: Secondary | ICD-10-CM

## 2011-08-04 DIAGNOSIS — IMO0001 Reserved for inherently not codable concepts without codable children: Secondary | ICD-10-CM | POA: Insufficient documentation

## 2011-08-04 DIAGNOSIS — R0602 Shortness of breath: Secondary | ICD-10-CM | POA: Insufficient documentation

## 2011-08-04 DIAGNOSIS — F17200 Nicotine dependence, unspecified, uncomplicated: Secondary | ICD-10-CM | POA: Insufficient documentation

## 2011-08-04 MED ORDER — DIPHENHYDRAMINE HCL 50 MG/ML IJ SOLN
25.0000 mg | Freq: Once | INTRAMUSCULAR | Status: AC
  Administered 2011-08-04: 25 mg via INTRAVENOUS
  Filled 2011-08-04: qty 1

## 2011-08-04 MED ORDER — FAMOTIDINE IN NACL 20-0.9 MG/50ML-% IV SOLN
20.0000 mg | Freq: Once | INTRAVENOUS | Status: AC
  Administered 2011-08-04: 20 mg via INTRAVENOUS
  Filled 2011-08-04: qty 50

## 2011-08-04 MED ORDER — METHYLPREDNISOLONE SODIUM SUCC 125 MG IJ SOLR
125.0000 mg | Freq: Once | INTRAMUSCULAR | Status: AC
  Administered 2011-08-04: 125 mg via INTRAVENOUS
  Filled 2011-08-04: qty 2

## 2011-08-04 NOTE — ED Notes (Signed)
Patient complaining of an allergic reaction; patient reporting rash/break-out on arms, facial swelling, and shortness of breath.  Patient states that she was outside all day for a church function.  Patient reports having trouble with rash/hives on arms for two years and was told to double her dose of Allegra, but patient states that she has never experience facial swelling and shortness of breath along with the rash.  Patient denies any allergies to food/chemicals.  Denies using new soaps/detergents.

## 2011-08-04 NOTE — Discharge Instructions (Signed)

## 2011-08-04 NOTE — ED Provider Notes (Signed)
Medical screening examination/treatment/procedure(s) were performed by non-physician practitioner and as supervising physician I was immediately available for consultation/collaboration.  Ritta Hammes R. Lavetta Geier, MD 08/04/11 2332 

## 2011-08-04 NOTE — ED Provider Notes (Signed)
History     CSN: [id number]  Arrival date & time 08/04/11  1941   First MD Initiated Contact with Patient 08/04/11 1956      Chief Complaint  Patient presents with  . Allergic Reaction    (Consider location/radiation/quality/duration/timing/severity/associated sxs/prior treatment) HPI Comments: Pt states that she broke out in hives all over and felt sob:pt states that she was outside at a cookout:pt denies any new exposure:pt states that she is not feeling sob at this time:pt states that she has not had a new exposure that she knows of  Patient is a 41 y.o. female presenting with allergic reaction. The history is provided by the patient. No language interpreter was used.  Allergic Reaction The primary symptoms are  shortness of breath and rash. The current episode started 1 to 2 hours ago. The problem has not changed since onset.This is a recurrent problem.    Past Medical History  Diagnosis Date  . No significant past medical history     Past Surgical History  Procedure Date  . Foot surgery     History reviewed. No pertinent family history.  History  Substance Use Topics  . Smoking status: Current Everyday Smoker    Types: Cigarettes  . Smokeless tobacco: Not on file  . Alcohol Use: No    OB History    Grav Para Term Preterm Abortions TAB SAB Ect Mult Living                  Review of Systems  Constitutional: Negative.   Respiratory: Positive for shortness of breath.   Cardiovascular: Negative.   Skin: Positive for rash.  Neurological: Negative.     Allergies  Review of patient's allergies indicates no known allergies.  Home Medications   Current Outpatient Rx  Name Route Sig Dispense Refill  . FEXOFENADINE HCL 180 MG PO TABS Oral Take 180 mg by mouth daily.    . FUROSEMIDE 20 MG PO TABS Oral Take 20 mg by mouth daily.     . ADULT MULTIVITAMIN W/MINERALS CH Oral Take 1 tablet by mouth daily.      BP 148/84  Pulse 100  Temp(Src) 97.9 F (36.6 C)  (Oral)  Resp 20  SpO2 98%  LMP 07/25/2011  Physical Exam  Nursing note and vitals reviewed. Constitutional: She is oriented to person, place, and time. She appears well-developed and well-nourished.  HENT:  Right Ear: External ear normal.  Left Ear: External ear normal.  Mouth/Throat: Oropharynx is clear and moist.       Hives noted to face  Eyes: Conjunctivae and EOM are normal.  Cardiovascular: Normal rate and regular rhythm.   Pulmonary/Chest: Effort normal and breath sounds normal. No respiratory distress. She has no wheezes.  Musculoskeletal: Normal range of motion.  Neurological: She is alert and oriented to person, place, and time.  Skin:       Hives noted to entire body  Psychiatric: She has a normal mood and affect.    ED Course  Procedures (including critical care time)  Labs Reviewed - No data to display No results found.   1. Allergic reaction       MDM  pts rash is gone:pt is no respiratory distress:discussed allergy testing in follow up with the pt        Teressa Lower, NP 08/04/11 2246

## 2011-08-04 NOTE — ED Notes (Signed)
Patient is AOx4 and comfortable with her discharge instructions. 

## 2014-07-27 ENCOUNTER — Other Ambulatory Visit: Payer: Self-pay | Admitting: Family Medicine

## 2014-07-27 DIAGNOSIS — R1084 Generalized abdominal pain: Secondary | ICD-10-CM

## 2014-08-03 ENCOUNTER — Ambulatory Visit
Admission: RE | Admit: 2014-08-03 | Discharge: 2014-08-03 | Disposition: A | Discharge status: Home / Self Care | Payer: Self-pay | Source: Ambulatory Visit | Attending: Family Medicine | Admitting: Family Medicine

## 2014-08-03 DIAGNOSIS — R1084 Generalized abdominal pain: Secondary | ICD-10-CM

## 2014-08-04 ENCOUNTER — Other Ambulatory Visit: Payer: Self-pay | Admitting: *Deleted

## 2014-08-04 DIAGNOSIS — I83893 Varicose veins of bilateral lower extremities with other complications: Secondary | ICD-10-CM

## 2014-08-06 ENCOUNTER — Encounter: Payer: Self-pay | Admitting: Vascular Surgery

## 2014-09-17 ENCOUNTER — Encounter: Payer: Self-pay | Admitting: Vascular Surgery

## 2014-09-21 ENCOUNTER — Ambulatory Visit (HOSPITAL_COMMUNITY)
Admission: RE | Admit: 2014-09-21 | Discharge: 2014-09-21 | Disposition: A | Discharge status: Home / Self Care | Payer: BLUE CROSS/BLUE SHIELD | Source: Ambulatory Visit | Attending: Vascular Surgery | Admitting: Vascular Surgery

## 2014-09-21 ENCOUNTER — Encounter: Payer: Self-pay | Admitting: Vascular Surgery

## 2014-09-21 ENCOUNTER — Ambulatory Visit (INDEPENDENT_AMBULATORY_CARE_PROVIDER_SITE_OTHER): Payer: BLUE CROSS/BLUE SHIELD | Admitting: Vascular Surgery

## 2014-09-21 VITALS — BP 116/72 | HR 78 | Resp 16 | Ht 66.0 in | Wt 265.0 lb

## 2014-09-21 DIAGNOSIS — I83893 Varicose veins of bilateral lower extremities with other complications: Secondary | ICD-10-CM

## 2014-09-21 NOTE — Progress Notes (Signed)
Subjective:     Patient ID: [name], female   DOB: [birth year], 44 y.o.   MRN: [id number]  HPI this 44 year old female was referred for painful varicosities in both lower extremities with worsening edema. She has had bulging varicosities in the left medial thigh for several years and has had progressive edema in both legs over the past several years. Develops aching throbbing and burning discomfort particularly in the right medial calf proximally and in the left medial thigh which worsened as the day progresses. She has no history of DVT thrombophlebitis stasis ulcers or bleeding. She does not wear elastic compression stockings on a regular basis. She states this pain is becoming much more severe than in the past and is affecting her daily living at the present time.  Past Medical History  Diagnosis Date  . Varicose veins     History  Substance Use Topics  . Smoking status: Former Games developer  . Smokeless tobacco: Not on file  . Alcohol Use: No    Family History  Problem Relation Age of Onset  . Heart disease Mother   . Diabetes Mother   . Hypertension Mother   . Cancer Father   . Hypertension Father   . Asthma Sister   . Seizures Sister     No Known Allergies   Current outpatient prescriptions:  .  Cetirizine HCl 10 MG CAPS, Take 2 capsules by mouth 2 (two) times daily., Disp: , Rfl:  .  ranitidine (ZANTAC) 150 MG tablet, Take 150 mg by mouth 2 (two) times daily., Disp: , Rfl:  .  diphenhydrAMINE (SOMINEX) 25 MG tablet, Take 25 mg by mouth every 6 (six) hours as needed for sleep., Disp: , Rfl:  .  fexofenadine (ALLEGRA) 180 MG tablet, Take 180 mg by mouth daily., Disp: , Rfl:  .  furosemide (LASIX) 20 MG tablet, Take 20 mg by mouth daily as needed., Disp: , Rfl:   Filed Vitals:   09/21/14 1443  BP: 116/72  Pulse: 78  Resp: 16  Height:  (1.676 m)  Weight: 265 lb (120.203 kg)    Body mass index is 42.79 kg/(m^2).           Review of Systems denies chest pain  but does have mild dyspnea on exertion. No hemoptysis, claudication,     Objective:   Physical Exam BP 116/72 mmHg  Pulse 78  Resp 16  Ht  (1.676 m)  Wt 265 lb (120.203 kg)  BMI 42.79 kg/m2  Gen.-alert and oriented x3 in no apparent distress-obese HEENT normal for age Lungs no rhonchi or wheezing Cardiovascular regular rhythm no murmurs carotid pulses 3+ palpable no bruits audible Abdomen soft nontender no palpable masses Musculoskeletal free of  major deformities Skin clear -no rashes Neurologic normal Lower extremities 3+ femoral and dorsalis pedis pulses palpable bilaterally with 1+ edema bilaterally Left medial thigh with large bulging varicosities in the great saphenous distribution. She has hyperpigmentation in patches in the medial left thigh and calf due to a birthmark. No hyperpigmentation lower third of leg and no active ulceration noted. Significant edema in the calf and ankle. Right leg with diffuse edema from the knee to the ankle with no large bulging varicosities noted. Medial calf hypersensitive and tender to palpation.  Today I ordered bilateral venous duplex exams are reviewed and interpreted. There is no DVT. There is some reflux in the deep system bilaterally. There is also gross reflux in both great saphenous systems. On the left side this  large proximal great saphenous vein is supplying the painful varicosities in the medial and mid and distal thigh area.         Assessment:     Painful varicosities and bilateral edema due to gross reflux bilateral great saphenous veins. Symptoms are affecting patient's daily living with pain and swelling.    Plan:         #1 long leg elastic compression stockings 20-30 mm gradient #2 elevate legs as much as possible #3 ibuprofen daily on a regular basis for pain #4 return in 3 months-if no significant improvement then patient should have number-one laser ablation left great saphenous vein +10-20 stab phlebectomy  of painful varicosities followed by #2 laser ablation right great saphenous vein Return in 3 months

## 2014-12-27 ENCOUNTER — Encounter: Payer: Self-pay | Admitting: Vascular Surgery

## 2014-12-28 ENCOUNTER — Ambulatory Visit: Payer: BLUE CROSS/BLUE SHIELD | Admitting: Vascular Surgery

## 2014-12-31 LAB — OB RESULTS CONSOLE GC/CHLAMYDIA
Chlamydia: NEGATIVE
Gonorrhea: NEGATIVE

## 2014-12-31 LAB — OB RESULTS CONSOLE ABO/RH: RH Type: POSITIVE

## 2014-12-31 LAB — OB RESULTS CONSOLE RPR: RPR: NONREACTIVE

## 2014-12-31 LAB — OB RESULTS CONSOLE HIV ANTIBODY (ROUTINE TESTING): HIV: NONREACTIVE

## 2014-12-31 LAB — OB RESULTS CONSOLE ANTIBODY SCREEN: Antibody Screen: NEGATIVE

## 2014-12-31 LAB — OB RESULTS CONSOLE HEPATITIS B SURFACE ANTIGEN: Hepatitis B Surface Ag: NEGATIVE

## 2014-12-31 LAB — OB RESULTS CONSOLE RUBELLA ANTIBODY, IGM: Rubella: IMMUNE

## 2015-07-08 LAB — OB RESULTS CONSOLE GBS: STREP GROUP B AG: POSITIVE

## 2015-07-11 ENCOUNTER — Encounter (HOSPITAL_COMMUNITY): Payer: Self-pay | Admitting: *Deleted

## 2015-07-11 ENCOUNTER — Inpatient Hospital Stay (HOSPITAL_COMMUNITY)
Admission: AD | Admit: 2015-07-11 | Discharge: 2015-07-11 | Disposition: A | Discharge status: Home / Self Care | Payer: BLUE CROSS/BLUE SHIELD | Source: Ambulatory Visit | Attending: Obstetrics and Gynecology | Admitting: Obstetrics and Gynecology

## 2015-07-11 DIAGNOSIS — O133 Gestational [pregnancy-induced] hypertension without significant proteinuria, third trimester: Secondary | ICD-10-CM | POA: Diagnosis not present

## 2015-07-11 DIAGNOSIS — Z3A36 36 weeks gestation of pregnancy: Secondary | ICD-10-CM | POA: Diagnosis not present

## 2015-07-11 HISTORY — DX: Gestational (pregnancy-induced) hypertension without significant proteinuria, unspecified trimester: O13.9

## 2015-07-11 LAB — COMPREHENSIVE METABOLIC PANEL
ALBUMIN: 2.6 g/dL — AB (ref 3.5–5.0)
ALK PHOS: 119 U/L (ref 38–126)
ALT: 14 U/L (ref 14–54)
ANION GAP: 8 (ref 5–15)
AST: 18 U/L (ref 15–41)
BILIRUBIN TOTAL: 0.4 mg/dL (ref 0.3–1.2)
BUN: 10 mg/dL (ref 6–20)
CALCIUM: 9 mg/dL (ref 8.9–10.3)
CO2: 22 mmol/L (ref 22–32)
CREATININE: 0.65 mg/dL (ref 0.44–1.00)
Chloride: 107 mmol/L (ref 101–111)
GFR calc Af Amer: >60 mL/min (ref >60)
GFR calc non Af Amer: >60 mL/min (ref >60)
GLUCOSE: 102 mg/dL — AB (ref 65–99)
Potassium: 4.1 mmol/L (ref 3.5–5.1)
Sodium: 137 mmol/L (ref 135–145)
TOTAL PROTEIN: 5.5 g/dL — AB (ref 6.5–8.1)

## 2015-07-11 LAB — PROTEIN / CREATININE RATIO, URINE: Creatinine, Urine: 33 mg/dL

## 2015-07-11 LAB — URIC ACID: Uric Acid, Serum: 6.8 mg/dL — ABNORMAL HIGH (ref 2.3–6.6)

## 2015-07-11 MED ORDER — PANTOPRAZOLE SODIUM 40 MG PO TBEC: 40.0000 mg | DELAYED_RELEASE_TABLET | Freq: Every day | ORAL | Status: DC

## 2015-07-11 NOTE — MAU Note (Signed)
Pt sent from MD office for elevated BP, gained 6 lbs since Friday.  Denies uc's, bleeding or LOF.  C/O slight HA, no visual changes.

## 2015-07-11 NOTE — MAU Provider Note (Signed)
History     Chief Complaint  Patient presents with  . Hypertension   HPI: 45 yo G1P0 MBF @ 36 3/[redacted] wks gestation sent from office for evaluation due to elevated BP and 5lb weight gain since Fri. Pt notes slight h/a. Notes increased leg swelling. Pt c/o heartburn still not responsive to zantac and tums Denies visual changes. CBC done at office showed nl plt ct, nl hgb  OB History    Gravida Para Term Preterm AB TAB SAB Ectopic Multiple Living   1               Past Medical History  Diagnosis Date  . Varicose veins   . Pregnancy induced hypertension     Past Surgical History  Procedure Laterality Date  . Toe surgery Bilateral     Family History  Problem Relation Age of Onset  . Heart disease Mother   . Diabetes Mother   . Hypertension Mother   . Cancer Father   . Hypertension Father   . Asthma Sister   . Seizures Sister     Social History  Substance Use Topics  . Smoking status: Former Games developermoker  . Smokeless tobacco: None  . Alcohol Use: No    Allergies: No Known Allergies  Prescriptions prior to admission  Medication Sig Dispense Refill Last Dose  . acetaminophen (TYLENOL) 500 MG tablet Take 500 mg by mouth every 6 (six) hours as needed for mild pain.   Past Week at Unknown time  . cetirizine (ZYRTEC) 10 MG tablet Take 10 mg by mouth 2 (two) times daily.   07/10/2015 at Unknown time  . cholecalciferol (VITAMIN D) 1000 units tablet Take 1,000 Units by mouth daily.   07/11/2015 at Unknown time  . diphenhydrAMINE (BENADRYL) 25 MG tablet Take 25 mg by mouth daily as needed for allergies.   Past Week at Unknown time  . FERREX 150 150 MG capsule Take 150 mg by mouth daily.  12 07/11/2015 at Unknown time  . Prenatal Vit-Fe Fumarate-FA (PRENATAL MULTIVITAMIN) TABS tablet Take 1 tablet by mouth at bedtime.   07/10/2015 at Unknown time  . ranitidine (ZANTAC) 150 MG tablet Take 150 mg by mouth 2 (two) times daily.   07/11/2015 at Unknown time     Physical Exam   Blood pressure  133/92, pulse 74, temperature 98.1 F (36.7 C), temperature source Oral, resp. rate 18. BP 139/86 mmHg  Pulse 76  Temp(Src) 98.1 F (36.7 C) (Oral)  Resp 18   No exam performed today, done in office prior to MAU visit. Extremity: 3+ edema  Tracing: baseline 130-135 (+) accel to 150 No ctx No current facility-administered medications for this encounter.   Current Outpatient Prescriptions  Medication Sig Dispense Refill  . acetaminophen (TYLENOL) 500 MG tablet Take 500 mg by mouth every 6 (six) hours as needed for mild pain.    . cetirizine (ZYRTEC) 10 MG tablet Take 10 mg by mouth 2 (two) times daily.    . cholecalciferol (VITAMIN D) 1000 units tablet Take 1,000 Units by mouth daily.    . diphenhydrAMINE (BENADRYL) 25 MG tablet Take 25 mg by mouth daily as needed for allergies.    Marland Kitchen. FERREX 150 150 MG capsule Take 150 mg by mouth daily.  12  . Prenatal Vit-Fe Fumarate-FA (PRENATAL MULTIVITAMIN) TABS tablet Take 1 tablet by mouth at bedtime.    . pantoprazole (PROTONIX) 40 MG tablet Take 1 tablet (40 mg total) by mouth daily. 30 tablet 4  CMP Latest Ref Rng 07/11/2015  Glucose 65 - 99 mg/dL 161(W)  BUN 6 - 20 mg/dL 10  Creatinine 9.60 - 4.54 mg/dL 0.98  Sodium 119 - 147 mmol/L 137  Potassium 3.5 - 5.1 mmol/L 4.1  Chloride 101 - 111 mmol/L 107  CO2 22 - 32 mmol/L 22  Calcium 8.9 - 10.3 mg/dL 9.0  Total Protein 6.5 - 8.1 g/dL 8.2(N)  Total Bilirubin 0.3 - 1.2 mg/dL 0.4  Alkaline Phos 38 - 126 U/L 119  AST 15 - 41 U/L 18  ALT 14 - 54 U/L 14   Protein/creatinine ratio  Undetectable Uric acid 6.8 IMP: gestational hypertension P) d/c home . Preeclampsia warning signs. Protonix script . Keep ob appt 4/13. oow until further notice ED Course   MDM   Rheanna Sergent A, MD 6:20 PM 07/11/2015

## 2015-07-11 NOTE — Discharge Instructions (Signed)

## 2015-07-13 ENCOUNTER — Encounter (HOSPITAL_COMMUNITY): Payer: Self-pay

## 2015-07-13 ENCOUNTER — Encounter (HOSPITAL_COMMUNITY): Payer: Self-pay | Admitting: Emergency Medicine

## 2015-07-26 ENCOUNTER — Other Ambulatory Visit: Payer: Self-pay | Admitting: Obstetrics and Gynecology

## 2015-07-26 ENCOUNTER — Telehealth (HOSPITAL_COMMUNITY): Payer: Self-pay | Admitting: *Deleted

## 2015-07-26 ENCOUNTER — Encounter (HOSPITAL_COMMUNITY): Payer: Self-pay | Admitting: *Deleted

## 2015-07-26 NOTE — Telephone Encounter (Signed)
Preadmission screen  

## 2015-07-27 ENCOUNTER — Other Ambulatory Visit: Payer: Self-pay | Admitting: Obstetrics and Gynecology

## 2015-07-27 NOTE — H&P (Addendum)
Renee ProwsCatina [name] is a 45 y.o. female presenting for IOL for PEC without severe features and oligo. Maternal Medical History:  Reason for admission: Nausea.  Contractions: Onset was less than 1 hour ago.   Frequency: rare.   Perceived severity is mild.    Fetal activity: Perceived fetal activity is normal.    Prenatal complications: PIH and oligohydramnios.   Prenatal Complications - Diabetes: none.    OB History    Gravida Para Term Preterm AB TAB SAB Ectopic Multiple Living   1 0 0 0 0 0 0 0       Past Medical History  Diagnosis Date  . No significant past medical history   . Varicose veins   . Pregnancy induced hypertension   . Anemia   . Vitamin D deficiency    Past Surgical History  Procedure Laterality Date  . Foot surgery    . Toe surgery Bilateral    Family History: family history includes Asthma in her sister; Bipolar disorder in her sister; Cancer in her father; Diabetes in her mother; Heart disease in her mother; Hypertension in her father and mother; Seizures in her sister. Social History:  reports that she has quit smoking. She has never used smokeless tobacco. She reports that she does not drink alcohol or use illicit drugs.   Prenatal Transfer Tool  Maternal Diabetes: No Genetic Screening: Normal Maternal Ultrasounds/Referrals: Normal Fetal Ultrasounds or other Referrals:  None Maternal Substance Abuse:  No Significant Maternal Medications:  None Significant Maternal Lab Results:  Lab values include: Group B Strep positive Other Comments:  None  Review of Systems  Constitutional: Negative.   Eyes: Negative for blurred vision and photophobia.  Cardiovascular: Negative for chest pain.  Gastrointestinal: Negative for heartburn and nausea.  Neurological: Negative for headaches.      There were no vitals taken for this visit. Maternal Exam:  Uterine Assessment: Contraction strength is mild.  Contraction frequency is rare.   Abdomen: Patient reports no  abdominal tenderness. Fetal presentation: vertex  Introitus: Normal vulva. Normal vagina.  Ferning test: not done.  Nitrazine test: not done. Amniotic fluid character: not assessed.  Pelvis: questionable for delivery.   Cervix: Cervix evaluated by digital exam.     Physical Exam  Nursing note and vitals reviewed. Constitutional: She is oriented to person, place, and time. She appears well-developed and well-nourished.  HENT:  Head: Normocephalic and atraumatic.  Neck: Normal range of motion. Neck supple.  Cardiovascular: Normal rate and regular rhythm.   Respiratory: Effort normal and breath sounds normal.  GI: Soft. Bowel sounds are normal. She exhibits no distension.  Genitourinary: Vagina normal and uterus normal.  Musculoskeletal: Normal range of motion.  Neurological: She is alert and oriented to person, place, and time. She has normal reflexes.  Skin: Skin is warm and dry.    Prenatal labs: ABO, Rh: A/Positive/-- (09/30 0000) Antibody: Negative (09/30 0000) Rubella: Immune (09/30 0000) RPR: Nonreactive (09/30 0000)  HBsAg: Negative (09/30 0000)  HIV: Non-reactive (09/30 0000)  GBS: Positive (04/07 0000)   Assessment/Plan: 39 weeks PEC without severe features [place] AMA Admit for IOL   Susann Lawhorne J 07/27/2015, 9:34 PM

## 2015-07-28 ENCOUNTER — Inpatient Hospital Stay (HOSPITAL_COMMUNITY)
Admission: RE | Admit: 2015-07-28 | Discharge: 2015-07-31 | DRG: 766 | Disposition: A | Discharge status: Home / Self Care | Payer: BLUE CROSS/BLUE SHIELD | Source: Ambulatory Visit | Attending: Obstetrics and Gynecology | Admitting: Obstetrics and Gynecology

## 2015-07-28 ENCOUNTER — Inpatient Hospital Stay (HOSPITAL_COMMUNITY): Payer: BLUE CROSS/BLUE SHIELD | Admitting: Anesthesiology

## 2015-07-28 ENCOUNTER — Encounter (HOSPITAL_COMMUNITY): Payer: Self-pay

## 2015-07-28 ENCOUNTER — Encounter (HOSPITAL_COMMUNITY)
Admission: RE | Disposition: A | Discharge status: Home / Self Care | Payer: Self-pay | Source: Ambulatory Visit | Attending: Obstetrics and Gynecology

## 2015-07-28 DIAGNOSIS — Z3A38 38 weeks gestation of pregnancy: Secondary | ICD-10-CM | POA: Diagnosis not present

## 2015-07-28 DIAGNOSIS — Z833 Family history of diabetes mellitus: Secondary | ICD-10-CM

## 2015-07-28 DIAGNOSIS — K219 Gastro-esophageal reflux disease without esophagitis: Secondary | ICD-10-CM | POA: Diagnosis present

## 2015-07-28 DIAGNOSIS — O9962 Diseases of the digestive system complicating childbirth: Secondary | ICD-10-CM | POA: Diagnosis present

## 2015-07-28 DIAGNOSIS — Z8249 Family history of ischemic heart disease and other diseases of the circulatory system: Secondary | ICD-10-CM

## 2015-07-28 DIAGNOSIS — O4103X0 Oligohydramnios, third trimester, not applicable or unspecified: Principal | ICD-10-CM | POA: Diagnosis present

## 2015-07-28 DIAGNOSIS — O1490 Unspecified pre-eclampsia, unspecified trimester: Secondary | ICD-10-CM | POA: Diagnosis present

## 2015-07-28 DIAGNOSIS — O9902 Anemia complicating childbirth: Secondary | ICD-10-CM | POA: Diagnosis present

## 2015-07-28 DIAGNOSIS — Z87891 Personal history of nicotine dependence: Secondary | ICD-10-CM

## 2015-07-28 DIAGNOSIS — Z825 Family history of asthma and other chronic lower respiratory diseases: Secondary | ICD-10-CM

## 2015-07-28 DIAGNOSIS — D649 Anemia, unspecified: Secondary | ICD-10-CM | POA: Diagnosis present

## 2015-07-28 DIAGNOSIS — O134 Gestational [pregnancy-induced] hypertension without significant proteinuria, complicating childbirth: Secondary | ICD-10-CM | POA: Diagnosis present

## 2015-07-28 DIAGNOSIS — O874 Varicose veins of lower extremity in the puerperium: Secondary | ICD-10-CM | POA: Diagnosis present

## 2015-07-28 DIAGNOSIS — O99824 Streptococcus B carrier state complicating childbirth: Secondary | ICD-10-CM | POA: Diagnosis present

## 2015-07-28 LAB — PROTEIN / CREATININE RATIO, URINE
CREATININE, URINE: 110 mg/dL
Protein Creatinine Ratio: 0.09 mg/mg{Cre} (ref 0.00–0.15)
TOTAL PROTEIN, URINE: 10 mg/dL

## 2015-07-28 LAB — CBC
HCT: 35.2 % — ABNORMAL LOW (ref 36.0–46.0)
HEMATOCRIT: 36.3 % (ref 36.0–46.0)
Hemoglobin: 12.3 g/dL (ref 12.0–15.0)
Hemoglobin: 11.7 g/dL — ABNORMAL LOW (ref 12.0–15.0)
MCH: 32.5 pg (ref 26.0–34.0)
MCH: 31.8 pg (ref 26.0–34.0)
MCHC: 33.9 g/dL (ref 30.0–36.0)
MCHC: 33.2 g/dL (ref 30.0–36.0)
MCV: 96 fL (ref 78.0–100.0)
MCV: 95.7 fL (ref 78.0–100.0)
Platelets: 196 10*3/uL (ref 150–400)
Platelets: 171 10*3/uL (ref 150–400)
RBC: 3.78 MIL/uL — AB (ref 3.87–5.11)
RBC: 3.68 MIL/uL — ABNORMAL LOW (ref 3.87–5.11)
RDW: 15 % (ref 11.5–15.5)
RDW: 15.1 % (ref 11.5–15.5)
WBC: 12.1 10*3/uL — AB (ref 4.0–10.5)
WBC: 10.6 10*3/uL — ABNORMAL HIGH (ref 4.0–10.5)

## 2015-07-28 LAB — COMPREHENSIVE METABOLIC PANEL
ALBUMIN: 2.8 g/dL — AB (ref 3.5–5.0)
ALT: 18 U/L (ref 14–54)
AST: 25 U/L (ref 15–41)
Alkaline Phosphatase: 131 U/L — ABNORMAL HIGH (ref 38–126)
Anion gap: 6 (ref 5–15)
BILIRUBIN TOTAL: 0.3 mg/dL (ref 0.3–1.2)
BUN: 15 mg/dL (ref 6–20)
CHLORIDE: 107 mmol/L (ref 101–111)
CO2: 20 mmol/L — ABNORMAL LOW (ref 22–32)
CREATININE: 0.8 mg/dL (ref 0.44–1.00)
Calcium: 9 mg/dL (ref 8.9–10.3)
GFR calc Af Amer: >60 mL/min (ref >60)
GLUCOSE: 108 mg/dL — AB (ref 65–99)
POTASSIUM: 3.6 mmol/L (ref 3.5–5.1)
Sodium: 133 mmol/L — ABNORMAL LOW (ref 135–145)
Total Protein: 5.8 g/dL — ABNORMAL LOW (ref 6.5–8.1)

## 2015-07-28 LAB — LACTATE DEHYDROGENASE: LDH: 179 U/L (ref 98–192)

## 2015-07-28 LAB — TYPE AND SCREEN
ABO/RH(D): A POS
ANTIBODY SCREEN: NEGATIVE

## 2015-07-28 LAB — URIC ACID: URIC ACID, SERUM: 7.9 mg/dL — AB (ref 2.3–6.6)

## 2015-07-28 LAB — RPR: RPR Ser Ql: NONREACTIVE

## 2015-07-28 LAB — ABO/RH: ABO/RH(D): A POS

## 2015-07-28 SURGERY — Surgical Case
Anesthesia: Epidural

## 2015-07-28 MED ORDER — OXYTOCIN BOLUS FROM INFUSION: 500.0000 mL | INTRAVENOUS | Status: DC

## 2015-07-28 MED ORDER — IBUPROFEN 600 MG PO TABS
600.0000 mg | ORAL_TABLET | Freq: Four times a day (QID) | ORAL | Status: DC
  Administered 2015-07-29 – 2015-07-31 (×10): 600 mg via ORAL
  Filled 2015-07-28 (×10): qty 1

## 2015-07-28 MED ORDER — PHENYLEPHRINE 40 MCG/ML (10ML) SYRINGE FOR IV PUSH (FOR BLOOD PRESSURE SUPPORT): 80.0000 ug | PREFILLED_SYRINGE | INTRAVENOUS | Status: DC | PRN

## 2015-07-28 MED ORDER — PROCHLORPERAZINE EDISYLATE 5 MG/ML IJ SOLN: 10.0000 mg | INTRAMUSCULAR | Status: DC | PRN

## 2015-07-28 MED ORDER — COCONUT OIL OIL: 1.0000 "application " | TOPICAL_OIL | Status: DC | PRN

## 2015-07-28 MED ORDER — EPHEDRINE 5 MG/ML INJ: 10.0000 mg | INTRAVENOUS | Status: DC | PRN

## 2015-07-28 MED ORDER — OXYTOCIN 10 UNIT/ML IJ SOLN
INTRAMUSCULAR | Status: AC
  Filled 2015-07-28: qty 4

## 2015-07-28 MED ORDER — SIMETHICONE 80 MG PO CHEW
80.0000 mg | CHEWABLE_TABLET | Freq: Three times a day (TID) | ORAL | Status: DC
  Administered 2015-07-28 – 2015-07-31 (×8): 80 mg via ORAL
  Filled 2015-07-28 (×8): qty 1

## 2015-07-28 MED ORDER — LACTATED RINGERS IV SOLN
INTRAVENOUS | Status: DC
  Administered 2015-07-28 – 2015-07-29 (×2): via INTRAVENOUS

## 2015-07-28 MED ORDER — OXYCODONE-ACETAMINOPHEN 5-325 MG PO TABS: 2.0000 | ORAL_TABLET | ORAL | Status: DC | PRN

## 2015-07-28 MED ORDER — WITCH HAZEL-GLYCERIN EX PADS: 1.0000 "application " | MEDICATED_PAD | CUTANEOUS | Status: DC | PRN

## 2015-07-28 MED ORDER — ACETAMINOPHEN 325 MG PO TABS: 650.0000 mg | ORAL_TABLET | ORAL | Status: DC | PRN

## 2015-07-28 MED ORDER — DIPHENHYDRAMINE HCL 50 MG/ML IJ SOLN: 12.5000 mg | INTRAMUSCULAR | Status: DC | PRN

## 2015-07-28 MED ORDER — SODIUM CHLORIDE 0.9 % IR SOLN
Status: DC | PRN
  Administered 2015-07-28: 1000 mL

## 2015-07-28 MED ORDER — ONDANSETRON HCL 4 MG/2ML IJ SOLN: 4.0000 mg | Freq: Four times a day (QID) | INTRAMUSCULAR | Status: DC | PRN

## 2015-07-28 MED ORDER — MORPHINE SULFATE (PF) 0.5 MG/ML IJ SOLN
INTRAMUSCULAR | Status: DC | PRN
  Administered 2015-07-28: 3 mg via EPIDURAL

## 2015-07-28 MED ORDER — BUPIVACAINE HCL (PF) 0.25 % IJ SOLN
INTRAMUSCULAR | Status: AC
  Filled 2015-07-28: qty 30

## 2015-07-28 MED ORDER — NALOXONE HCL 0.4 MG/ML IJ SOLN: 0.4000 mg | INTRAMUSCULAR | Status: DC | PRN

## 2015-07-28 MED ORDER — BUPIVACAINE HCL (PF) 0.25 % IJ SOLN
INTRAMUSCULAR | Status: DC | PRN
  Administered 2015-07-28: 20 mL

## 2015-07-28 MED ORDER — METHYLERGONOVINE MALEATE 0.2 MG/ML IJ SOLN: 0.2000 mg | INTRAMUSCULAR | Status: DC | PRN

## 2015-07-28 MED ORDER — NALBUPHINE HCL 10 MG/ML IJ SOLN
5.0000 mg | Freq: Once | INTRAMUSCULAR | Status: AC | PRN
  Administered 2015-07-28: 5 mg via SUBCUTANEOUS

## 2015-07-28 MED ORDER — MORPHINE SULFATE (PF) 0.5 MG/ML IJ SOLN
INTRAMUSCULAR | Status: AC
  Filled 2015-07-28: qty 10

## 2015-07-28 MED ORDER — DIBUCAINE 1 % RE OINT: 1.0000 "application " | TOPICAL_OINTMENT | RECTAL | Status: DC | PRN

## 2015-07-28 MED ORDER — MEPERIDINE HCL 25 MG/ML IJ SOLN: 6.2500 mg | INTRAMUSCULAR | Status: DC | PRN

## 2015-07-28 MED ORDER — ZOLPIDEM TARTRATE 5 MG PO TABS
5.0000 mg | ORAL_TABLET | Freq: Every evening | ORAL | Status: DC | PRN
Start: 2015-07-28 — End: 2015-07-30

## 2015-07-28 MED ORDER — ONDANSETRON HCL 4 MG/2ML IJ SOLN
INTRAMUSCULAR | Status: AC
  Filled 2015-07-28: qty 2

## 2015-07-28 MED ORDER — LACTATED RINGERS IV SOLN: 500.0000 mL | Freq: Once | INTRAVENOUS | Status: DC

## 2015-07-28 MED ORDER — LACTATED RINGERS IV SOLN
INTRAVENOUS | Status: DC | PRN
  Administered 2015-07-28: 11:00:00 via INTRAVENOUS

## 2015-07-28 MED ORDER — SCOPOLAMINE 1 MG/3DAYS TD PT72: 1.0000 | MEDICATED_PATCH | Freq: Once | TRANSDERMAL | Status: DC

## 2015-07-28 MED ORDER — METHYLERGONOVINE MALEATE 0.2 MG PO TABS: 0.2000 mg | ORAL_TABLET | ORAL | Status: DC | PRN

## 2015-07-28 MED ORDER — LACTATED RINGERS IV SOLN
500.0000 mL | INTRAVENOUS | Status: DC | PRN
  Administered 2015-07-28: 500 mL via INTRAVENOUS

## 2015-07-28 MED ORDER — NALBUPHINE HCL 10 MG/ML IJ SOLN
INTRAMUSCULAR | Status: AC
  Administered 2015-07-28: 5 mg via SUBCUTANEOUS
  Filled 2015-07-28: qty 1

## 2015-07-28 MED ORDER — TERBUTALINE SULFATE 1 MG/ML IJ SOLN
0.2500 mg | Freq: Once | INTRAMUSCULAR | Status: DC | PRN
  Filled 2015-07-28: qty 1

## 2015-07-28 MED ORDER — SIMETHICONE 80 MG PO CHEW
80.0000 mg | CHEWABLE_TABLET | ORAL | Status: DC | PRN
  Administered 2015-07-29: 80 mg via ORAL

## 2015-07-28 MED ORDER — OXYCODONE-ACETAMINOPHEN 5-325 MG PO TABS: 1.0000 | ORAL_TABLET | ORAL | Status: DC | PRN

## 2015-07-28 MED ORDER — CEFAZOLIN SODIUM-DEXTROSE 2-4 GM/100ML-% IV SOLN: 2.0000 g | Freq: Three times a day (TID) | INTRAVENOUS | Status: DC

## 2015-07-28 MED ORDER — DIPHENHYDRAMINE HCL 25 MG PO CAPS
25.0000 mg | ORAL_CAPSULE | ORAL | Status: DC | PRN
Start: 2015-07-28 — End: 2015-07-29

## 2015-07-28 MED ORDER — PHENYLEPHRINE 40 MCG/ML (10ML) SYRINGE FOR IV PUSH (FOR BLOOD PRESSURE SUPPORT)
80.0000 ug | PREFILLED_SYRINGE | INTRAVENOUS | Status: DC | PRN
  Filled 2015-07-28: qty 10

## 2015-07-28 MED ORDER — KETOROLAC TROMETHAMINE 30 MG/ML IJ SOLN
30.0000 mg | Freq: Four times a day (QID) | INTRAMUSCULAR | Status: DC | PRN
  Administered 2015-07-28: 30 mg via INTRAMUSCULAR

## 2015-07-28 MED ORDER — LACTATED RINGERS IV SOLN
40.0000 [IU] | INTRAVENOUS | Status: DC | PRN
  Administered 2015-07-28: 40 [IU] via INTRAVENOUS

## 2015-07-28 MED ORDER — LACTATED RINGERS IV SOLN: INTRAVENOUS | Status: DC

## 2015-07-28 MED ORDER — PENICILLIN G POTASSIUM 5000000 UNITS IJ SOLR: 2.5000 10*6.[IU] | INTRAVENOUS | Status: DC

## 2015-07-28 MED ORDER — OXYTOCIN 10 UNIT/ML IJ SOLN: 2.5000 [IU]/h | INTRAVENOUS | Status: DC

## 2015-07-28 MED ORDER — SODIUM BICARBONATE 8.4 % IV SOLN
INTRAVENOUS | Status: DC | PRN
  Administered 2015-07-28 (×2): 5 mL via EPIDURAL

## 2015-07-28 MED ORDER — NALOXONE HCL 2 MG/2ML IJ SOSY
1.0000 ug/kg/h | PREFILLED_SYRINGE | INTRAVENOUS | Status: DC | PRN
  Filled 2015-07-28: qty 2

## 2015-07-28 MED ORDER — FENTANYL 2.5 MCG/ML BUPIVACAINE 1/10 % EPIDURAL INFUSION (WH - ANES)
14.0000 mL/h | INTRAMUSCULAR | Status: DC | PRN
  Administered 2015-07-28: 14 mL/h via EPIDURAL
  Filled 2015-07-28: qty 125

## 2015-07-28 MED ORDER — DEXTROSE 5 % IV SOLN
3.0000 g | INTRAVENOUS | Status: DC | PRN
  Administered 2015-07-28: 3 g via INTRAVENOUS

## 2015-07-28 MED ORDER — FENTANYL CITRATE (PF) 100 MCG/2ML IJ SOLN
INTRAMUSCULAR | Status: AC
  Filled 2015-07-28: qty 2

## 2015-07-28 MED ORDER — SCOPOLAMINE 1 MG/3DAYS TD PT72
MEDICATED_PATCH | TRANSDERMAL | Status: AC
  Filled 2015-07-28: qty 2

## 2015-07-28 MED ORDER — OXYTOCIN 10 UNIT/ML IJ SOLN
1.0000 m[IU]/min | INTRAMUSCULAR | Status: DC
  Administered 2015-07-28: 2 m[IU]/min via INTRAVENOUS
  Filled 2015-07-28: qty 4

## 2015-07-28 MED ORDER — ONDANSETRON HCL 4 MG/2ML IJ SOLN
INTRAMUSCULAR | Status: DC | PRN
  Administered 2015-07-28: 4 mg via INTRAVENOUS

## 2015-07-28 MED ORDER — KETOROLAC TROMETHAMINE 30 MG/ML IJ SOLN
INTRAMUSCULAR | Status: AC
  Administered 2015-07-28: 30 mg via INTRAMUSCULAR
  Filled 2015-07-28: qty 1

## 2015-07-28 MED ORDER — OXYCODONE-ACETAMINOPHEN 5-325 MG PO TABS
1.0000 | ORAL_TABLET | ORAL | Status: DC | PRN
  Administered 2015-07-29 – 2015-07-31 (×6): 1 via ORAL
  Filled 2015-07-28 (×5): qty 1

## 2015-07-28 MED ORDER — DEXAMETHASONE SODIUM PHOSPHATE 4 MG/ML IJ SOLN
INTRAMUSCULAR | Status: DC | PRN
  Administered 2015-07-28: 4 mg via INTRAVENOUS

## 2015-07-28 MED ORDER — DEXTROSE 5 % IV SOLN
3.0000 g | Freq: Once | INTRAVENOUS | Status: DC
  Filled 2015-07-28: qty 3000

## 2015-07-28 MED ORDER — DIPHENHYDRAMINE HCL 25 MG PO CAPS: 25.0000 mg | ORAL_CAPSULE | Freq: Four times a day (QID) | ORAL | Status: DC | PRN

## 2015-07-28 MED ORDER — CITRIC ACID-SODIUM CITRATE 334-500 MG/5ML PO SOLN
30.0000 mL | ORAL | Status: DC | PRN
  Administered 2015-07-28: 30 mL via ORAL
  Filled 2015-07-28: qty 15

## 2015-07-28 MED ORDER — DIPHENHYDRAMINE HCL 50 MG/ML IJ SOLN
12.5000 mg | INTRAMUSCULAR | Status: DC | PRN
Start: 2015-07-28 — End: 2015-07-29

## 2015-07-28 MED ORDER — PENICILLIN G POTASSIUM 5000000 UNITS IJ SOLR: 5.0000 10*6.[IU] | Freq: Once | INTRAVENOUS | Status: DC

## 2015-07-28 MED ORDER — FLEET ENEMA 7-19 GM/118ML RE ENEM: 1.0000 | ENEMA | RECTAL | Status: DC | PRN

## 2015-07-28 MED ORDER — SODIUM CHLORIDE 0.9% FLUSH: 3.0000 mL | INTRAVENOUS | Status: DC | PRN

## 2015-07-28 MED ORDER — ONDANSETRON HCL 4 MG/2ML IJ SOLN: 4.0000 mg | Freq: Three times a day (TID) | INTRAMUSCULAR | Status: DC | PRN

## 2015-07-28 MED ORDER — NALBUPHINE HCL 10 MG/ML IJ SOLN: 5.0000 mg | Freq: Once | INTRAMUSCULAR | Status: AC | PRN

## 2015-07-28 MED ORDER — KETOROLAC TROMETHAMINE 30 MG/ML IJ SOLN
30.0000 mg | Freq: Four times a day (QID) | INTRAMUSCULAR | Status: DC | PRN
  Administered 2015-07-28: 30 mg via INTRAVENOUS
  Filled 2015-07-28: qty 1

## 2015-07-28 MED ORDER — TETANUS-DIPHTH-ACELL PERTUSSIS 5-2.5-18.5 LF-MCG/0.5 IM SUSP: 0.5000 mL | Freq: Once | INTRAMUSCULAR | Status: DC

## 2015-07-28 MED ORDER — NALBUPHINE HCL 10 MG/ML IJ SOLN: 5.0000 mg | INTRAMUSCULAR | Status: DC | PRN

## 2015-07-28 MED ORDER — TERBUTALINE SULFATE 1 MG/ML IJ SOLN: 0.2500 mg | Freq: Once | INTRAMUSCULAR | Status: DC | PRN

## 2015-07-28 MED ORDER — LIDOCAINE HCL (PF) 1 % IJ SOLN
INTRAMUSCULAR | Status: DC | PRN
  Administered 2015-07-28 (×2): 4 mL via EPIDURAL

## 2015-07-28 MED ORDER — MISOPROSTOL 25 MCG QUARTER TABLET
25.0000 ug | ORAL_TABLET | ORAL | Status: DC | PRN
  Administered 2015-07-28: 25 ug via VAGINAL
  Filled 2015-07-28: qty 0.25

## 2015-07-28 MED ORDER — SENNOSIDES-DOCUSATE SODIUM 8.6-50 MG PO TABS
2.0000 | ORAL_TABLET | ORAL | Status: DC
  Administered 2015-07-29 – 2015-07-30 (×3): 2 via ORAL
  Filled 2015-07-28 (×3): qty 2

## 2015-07-28 MED ORDER — PENICILLIN G POTASSIUM 5000000 UNITS IJ SOLR
2.5000 10*6.[IU] | INTRAVENOUS | Status: DC
  Administered 2015-07-28: 2.5 10*6.[IU] via INTRAVENOUS
  Filled 2015-07-28 (×3): qty 2.5

## 2015-07-28 MED ORDER — FENTANYL CITRATE (PF) 100 MCG/2ML IJ SOLN
50.0000 ug | INTRAMUSCULAR | Status: DC | PRN
  Filled 2015-07-28: qty 2

## 2015-07-28 MED ORDER — OXYCODONE-ACETAMINOPHEN 5-325 MG PO TABS
2.0000 | ORAL_TABLET | ORAL | Status: DC | PRN
  Administered 2015-07-29 (×2): 2 via ORAL
  Filled 2015-07-28 (×3): qty 2

## 2015-07-28 MED ORDER — DEXAMETHASONE SODIUM PHOSPHATE 4 MG/ML IJ SOLN
INTRAMUSCULAR | Status: AC
  Filled 2015-07-28: qty 1

## 2015-07-28 MED ORDER — SCOPOLAMINE 1 MG/3DAYS TD PT72
MEDICATED_PATCH | TRANSDERMAL | Status: DC | PRN
  Administered 2015-07-28: 1 via TRANSDERMAL

## 2015-07-28 MED ORDER — PENICILLIN G POTASSIUM 5000000 UNITS IJ SOLR
5.0000 10*6.[IU] | Freq: Once | INTRAMUSCULAR | Status: AC
  Administered 2015-07-28: 5 10*6.[IU] via INTRAVENOUS
  Filled 2015-07-28: qty 5

## 2015-07-28 MED ORDER — PRENATAL MULTIVITAMIN CH
1.0000 | ORAL_TABLET | Freq: Every day | ORAL | Status: DC
  Administered 2015-07-29 – 2015-07-30 (×2): 1 via ORAL
  Filled 2015-07-28 (×2): qty 1

## 2015-07-28 MED ORDER — FENTANYL CITRATE (PF) 100 MCG/2ML IJ SOLN
25.0000 ug | INTRAMUSCULAR | Status: DC | PRN
  Administered 2015-07-28: 25 ug via INTRAVENOUS

## 2015-07-28 MED ORDER — LACTATED RINGERS IV SOLN
INTRAVENOUS | Status: DC
  Administered 2015-07-28 (×4): via INTRAVENOUS

## 2015-07-28 MED ORDER — LIDOCAINE HCL (PF) 1 % IJ SOLN: 30.0000 mL | INTRAMUSCULAR | Status: DC | PRN

## 2015-07-28 MED ORDER — MENTHOL 3 MG MT LOZG: 1.0000 | LOZENGE | OROMUCOSAL | Status: DC | PRN

## 2015-07-28 MED ORDER — SIMETHICONE 80 MG PO CHEW
80.0000 mg | CHEWABLE_TABLET | ORAL | Status: DC
  Administered 2015-07-29 – 2015-07-30 (×3): 80 mg via ORAL
  Filled 2015-07-28 (×3): qty 1

## 2015-07-28 SURGICAL SUPPLY — 39 items
APL SKNCLS STERI-STRIP NONHPOA (GAUZE/BANDAGES/DRESSINGS) ×1
BENZOIN TINCTURE PRP APPL 2/3 (GAUZE/BANDAGES/DRESSINGS) ×2 IMPLANT
CHLORAPREP W/TINT 26ML (MISCELLANEOUS) ×3 IMPLANT
CLAMP CORD UMBIL (MISCELLANEOUS) IMPLANT
CLOSURE WOUND 1/2 X4 (GAUZE/BANDAGES/DRESSINGS) ×1
CLOTH BEACON ORANGE TIMEOUT ST (SAFETY) ×3 IMPLANT
CONTAINER PREFILL 10% NBF 15ML (MISCELLANEOUS) IMPLANT
DRSG OPSITE POSTOP 4X10 (GAUZE/BANDAGES/DRESSINGS) ×3 IMPLANT
ELECT REM PT RETURN 9FT ADLT (ELECTROSURGICAL) ×3
ELECTRODE REM PT RTRN 9FT ADLT (ELECTROSURGICAL) ×1 IMPLANT
EXTRACTOR VACUUM M CUP 4 TUBE (SUCTIONS) IMPLANT
EXTRACTOR VACUUM M CUP 4' TUBE (SUCTIONS)
GLOVE BIO SURGEON STRL SZ7.5 (GLOVE) ×3 IMPLANT
GLOVE BIOGEL PI IND STRL 7.0 (GLOVE) ×2 IMPLANT
GLOVE BIOGEL PI INDICATOR 7.0 (GLOVE) ×4
GOWN STRL REUS W/TWL LRG LVL3 (GOWN DISPOSABLE) ×9 IMPLANT
KIT ABG SYR 3ML LUER SLIP (SYRINGE) IMPLANT
NDL HYPO 25X5/8 SAFETYGLIDE (NEEDLE) IMPLANT
NDL SPNL 20GX3.5 QUINCKE YW (NEEDLE) IMPLANT
NEEDLE HYPO 22GX1.5 SAFETY (NEEDLE) ×5 IMPLANT
NEEDLE HYPO 25X5/8 SAFETYGLIDE (NEEDLE) IMPLANT
NEEDLE SPNL 20GX3.5 QUINCKE YW (NEEDLE) IMPLANT
NS IRRIG 1000ML POUR BTL (IV SOLUTION) ×3 IMPLANT
PACK C SECTION WH (CUSTOM PROCEDURE TRAY) ×3 IMPLANT
PENCIL SMOKE EVAC W/HOLSTER (ELECTROSURGICAL) ×3 IMPLANT
STRIP CLOSURE SKIN 1/2X4 (GAUZE/BANDAGES/DRESSINGS) ×1 IMPLANT
SUT MNCRL 0 VIOLET CTX 36 (SUTURE) ×2 IMPLANT
SUT MNCRL AB 3-0 PS2 27 (SUTURE) ×2 IMPLANT
SUT MON AB 2-0 CT1 27 (SUTURE) ×3 IMPLANT
SUT MON AB-0 CT1 36 (SUTURE) ×6 IMPLANT
SUT MONOCRYL 0 CTX 36 (SUTURE) ×4
SUT PLAIN 0 NONE (SUTURE) IMPLANT
SUT PLAIN 2 0 (SUTURE)
SUT PLAIN 2 0 XLH (SUTURE) ×2 IMPLANT
SUT PLAIN ABS 2-0 CT1 27XMFL (SUTURE) IMPLANT
SYR 20CC LL (SYRINGE) IMPLANT
SYR CONTROL 10ML LL (SYRINGE) ×5 IMPLANT
TOWEL OR 17X24 6PK STRL BLUE (TOWEL DISPOSABLE) ×3 IMPLANT
TRAY FOLEY CATH SILVER 14FR (SET/KITS/TRAYS/PACK) ×3 IMPLANT

## 2015-07-28 NOTE — Anesthesia Postprocedure Evaluation (Signed)
Anesthesia Post Note  Patient: Naval architectCatina [name]  Procedure(s) Performed: Procedure(s) (LRB): CESAREAN SECTION (N/A)  Patient location during evaluation: PACU Anesthesia Type: Epidural Level of consciousness: oriented and awake and alert Pain management: pain level controlled Vital Signs Assessment: post-procedure vital signs reviewed and stable Respiratory status: spontaneous breathing, respiratory function stable and patient connected to nasal cannula oxygen Cardiovascular status: blood pressure returned to baseline and stable Postop Assessment: no headache, no backache and epidural receding Anesthetic complications: no     Last Vitals:  Filed Vitals:   07/28/15 1145 07/28/15 1200  BP: 140/67 118/74  Pulse: 68 81  Temp: 36.6 C   Resp: 19     Last Pain:  Filed Vitals:   07/28/15 1210  PainSc: 3    Pain Goal:                 Shelton SilvasKevin D Vaunda Gutterman

## 2015-07-28 NOTE — Lactation Note (Signed)
This note was copied from a baby's chart. Lactation Consultation Note Initial visit at 9 hours of age.  Mom reports baby has had a 10 minutes feeding in the past hour.  Baby is awake on mom STS, showing feeding cues.  Assisted with hand expression and a few drops are expressed.  Assisted with Football hold STS.  Baby latched for a few sucks but to sleepy to maintain feeding.  Baby remains STS with mom.  [facility] given and discussed.  Encouraged to feed with early cues on demand.  Early newborn behavior discussed. Mom to call for assist as needed.     Patient Name: [name] ProwsCatina Sohm ZOXWR'UToday's Date: 07/28/2015 Reason for consult: Initial assessment   Maternal Data Has patient been taught Hand Expression?: Yes Does the patient have breastfeeding experience prior to this delivery?: No  Feeding Feeding Type: Breast Fed Length of feed:  (few sucks, baby sleepy)  LATCH Score/Interventions Latch: Repeated attempts needed to sustain latch, nipple held in mouth throughout feeding, stimulation needed to elicit sucking reflex.  Audible Swallowing: None  Type of Nipple: Everted at rest and after stimulation  Comfort (Breast/Nipple): Soft / non-tender     Hold (Positioning): Assistance needed to correctly position infant at breast and maintain latch. Intervention(s): Breastfeeding basics reviewed;Support Pillows;Position options;Skin to skin  LATCH Score: 6  Lactation Tools Discussed/Used     Consult Status Consult Status: Follow-up Date: 07/29/15 Follow-up type: In-patient    Gracious Renken, Arvella MerlesJana Lynn 07/28/2015, 7:37 PM

## 2015-07-28 NOTE — Anesthesia Post-op Follow-up Note (Cosign Needed)
  Anesthesia Pain Follow-up Note  Patient: [name]  Day #: 1  Date of Follow-up: 07/28/2015 Time: 5:52 PM  Last Vitals:  Filed Vitals:   07/28/15 1351 07/28/15 1458  BP: 133/79 140/73  Pulse: 60 65  Temp: 36.4 C 36.4 C  Resp: 18 18    Level of Consciousness: alert  Pain: 1 /10   Side Effects:None  Catheter Site Exam:healing  Plan: D/C from anesthesia care  Sakia Schrimpf

## 2015-07-28 NOTE — Transfer of Care (Signed)
Immediate Anesthesia Transfer of Care Note  Patient: [name]  Procedure(s) Performed: Procedure(s): CESAREAN SECTION (N/A)  Patient Location: PACU  Anesthesia Type:Epidural  Level of Consciousness: awake, alert  and oriented  Airway & Oxygen Therapy: Patient Spontanous Breathing  Post-op Assessment: Report given to RN and Post -op Vital signs reviewed and stable  Post vital signs: Reviewed and stable  Last Vitals:  Filed Vitals:   07/28/15 1000 07/28/15 1116  BP: 131/68 110/66  Pulse: 78 80  Temp:  36.6 C  Resp:  15    Last Pain:  Filed Vitals:   07/28/15 1116  PainSc: 0-No pain         Complications: No apparent anesthesia complications

## 2015-07-28 NOTE — Anesthesia Postprocedure Evaluation (Signed)
Anesthesia Post Note  Patient: Adrian ProwsCatina Lizer  Procedure(s) Performed: Procedure(s) (LRB): CESAREAN SECTION (N/A)  Patient location during evaluation: Mother Baby Anesthesia Type: Epidural Level of consciousness: awake and alert Pain management: satisfactory to patient Vital Signs Assessment: post-procedure vital signs reviewed and stable Respiratory status: respiratory function stable Cardiovascular status: stable Postop Assessment: no headache, no backache, epidural receding, patient able to bend at knees, no signs of nausea or vomiting and adequate PO intake Anesthetic complications: no Comments: Comfort level was assessed by AnesthesiaTeam and the patient was pleased with the care, interventions, and services provided by the Department of Anesthesia.     Last Vitals:  Filed Vitals:   07/28/15 1351 07/28/15 1458  BP: 133/79 140/73  Pulse: 60 65  Temp: 36.4 C 36.4 C  Resp: 18 18    Last Pain:  Filed Vitals:   07/28/15 1704  PainSc: 7    Pain Goal: Patients Stated Pain Goal: 3 (07/28/15 1600)               [name]

## 2015-07-28 NOTE — Anesthesia Preprocedure Evaluation (Addendum)
Anesthesia Evaluation  Patient identified by MRN, date of birth, ID band Patient awake    Reviewed: Allergy & Precautions, NPO status , Patient's Chart, lab work & pertinent test results  Airway Mallampati: III  TM Distance: >3 FB Neck ROM: Full    Dental  (+) Teeth Intact   Pulmonary former smoker,    breath sounds clear to auscultation       Cardiovascular hypertension, Pt. on medications + Peripheral Vascular Disease   Rhythm:Regular Rate:Normal     Neuro/Psych PSYCHIATRIC DISORDERS negative neurological ROS     GI/Hepatic Neg liver ROS, GERD  Medicated,  Endo/Other  negative endocrine ROS  Renal/GU negative Renal ROS  negative genitourinary   Musculoskeletal negative musculoskeletal ROS (+)   Abdominal (+) + obese,   Peds negative pediatric ROS (+)  Hematology   Anesthesia Other Findings   Reproductive/Obstetrics (+) Pregnancy                            Lab Results  Component Value Date   WBC 12.1* 07/28/2015   HGB 12.3 07/28/2015   HCT 36.3 07/28/2015   MCV 96.0 07/28/2015   PLT 196 07/28/2015   Lab Results  Component Value Date   INR 1.1 08/21/2008     Anesthesia Physical Anesthesia Plan  ASA: III  Anesthesia Plan: Epidural   Post-op Pain Management:    Induction:   Airway Management Planned:   Additional Equipment:   Intra-op Plan:   Post-operative Plan:   Informed Consent: I have reviewed the patients History and Physical, chart, labs and discussed the procedure including the risks, benefits and alternatives for the proposed anesthesia with the patient or authorized representative who has indicated his/her understanding and acceptance.     Plan Discussed with:   Anesthesia Plan Comments:         Anesthesia Quick Evaluation

## 2015-07-28 NOTE — Addendum Note (Signed)
Addendum  created 07/28/15 1753 by Graciela HusbandsWynn O Dovey Fatzinger, CRNA   Modules edited: Charges VN, Notes Section   Notes Section:  File: [id number]; File: [id number]

## 2015-07-28 NOTE — Op Note (Signed)
Cesarean Section Procedure Note  Indications: non-reassuring fetal status  Pre-operative Diagnosis: 39 week 0 day pregnancy.  Post-operative Diagnosis: same  Surgeon: Lenoard AdenAAVON,Jheri Mitter J   Assistants: Arita Missawson, CNM  Anesthesia: Epidural anesthesia and Local anesthesia 0.25.% bupivacaine  ASA Class: 2  Procedure Details  The patient was seen in the Holding Room. The risks, benefits, complications, treatment options, and expected outcomes were discussed with the patient.  The patient concurred with the proposed plan, giving informed consent. The risks of anesthesia, infection, bleeding and possible injury to other organs discussed. Injury to bowel, bladder, or ureter with possible need for repair discussed. Possible need for transfusion with secondary risks of hepatitis or HIV acquisition discussed. Post operative complications to include but not limited to DVT, PE and Pneumonia noted. The site of surgery properly noted/marked. The patient was taken to Operating Room # 1, identified as Adrian Prowsatina Vogel and the procedure verified as C-Section Delivery. A Time Out was held and the above information confirmed.  After induction of anesthesia, the patient was draped and prepped in the usual sterile manner. A Pfannenstiel incision was made and carried down through the subcutaneous tissue to the fascia. Fascial incision was made and extended transversely using Mayo scissors. The fascia was separated from the underlying rectus tissue superiorly and inferiorly. The peritoneum was identified and entered. Peritoneal incision was extended longitudinally. The utero-vesical peritoneal reflection was incised transversely and the bladder flap was bluntly freed from the lower uterine segment. A low transverse uterine incision(Kerr hysterotomy) was made. Delivered from OA presentation was a  female with Apgar scores of 8 at one minute and 9 at five minutes. Bulb suctioning gently performed. Neonatal team in attendance.After the  umbilical cord was clamped and cut cord blood was obtained for evaluation. The placenta was removed intact and appeared normal. The uterus was curetted with a dry lap pack. Good hemostasis was noted.The uterine outline, tubes and ovaries appeared normal. The uterine incision was closed with running locked sutures of 0 Monocryl x 2 layers. Hemostasis was observed. Lavage was carried out until clear.The parietal peritoneum was closed with a running 2-0 Monocryl suture. The fascia was then reapproximated with running sutures of 0 Monocryl. The skin was reapproximated with 3-0 monocryl after [place] closure with 2-0 plain.  Instrument, sponge, and needle counts were correct prior the abdominal closure and at the conclusion of the case.   Findings: FTLM. OT, body cord  Estimated Blood Loss:  500         Drains: foley                 Specimens: placenta                 Complications:  None; patient tolerated the procedure well.         Disposition: PACU - hemodynamically stable.         Condition: stable  Attending Attestation: I performed the procedure.

## 2015-07-28 NOTE — Progress Notes (Signed)
Renee ProwsCatina [name] is a 45 y.o. G1P0000 at 151w6d by LMP admitted for induction of labor due to Low amniotic fluid. and Hypertension.  Subjective: uncomfortable  Objective: BP 143/84 mmHg  Pulse 72  Temp(Src) 98.2 F (36.8 C) (Oral)  Resp 18  Ht 5\' 7"  (1.702 m)  Wt 132.904 kg (293 lb)  BMI 45.88 kg/m2  SpO2 97%      FHT:  FHR: 140 bpm, variability: moderate,  accelerations:  Present,  decelerations:  Present occ mild variable , occ late UC:   irregular, every 2-6 minutes SVE:   Cl-ft/ 80/-1   Labs: Lab Results  Component Value Date   WBC 12.1* 07/28/2015   HGB 12.3 07/28/2015   HCT 36.3 07/28/2015   MCV 96.0 07/28/2015   PLT 196 07/28/2015    Assessment / Plan: PEC without severe features  Oligohydramnios AMA  Labor: Progressing on Pitocin, will continue to increase then AROM Preeclampsia:  no signs or symptoms of toxicity and labs stable Fetal Wellbeing:  Category I and Category II Pain Control:  Labor support without medications I/D:  n/a Anticipated MOD:  guarded  Will get epidural, start Pitocin and consider amnioinfusion with AROM when possible  [name] 07/28/2015, 7:50 AM

## 2015-07-28 NOTE — Anesthesia Procedure Notes (Signed)
Epidural Patient location during procedure: OB Start time: 07/28/2015 8:19 AM End time: 07/28/2015 8:26 AM  Staffing Anesthesiologist: Shona SimpsonHOLLIS, Larayah Clute D Performed by: anesthesiologist   Preanesthetic Checklist Completed: patient identified, site marked, surgical consent, pre-op evaluation, timeout performed, IV checked, risks and benefits discussed and monitors and equipment checked  Epidural Patient position: sitting Prep: ChloraPrep Patient monitoring: heart rate, continuous pulse ox and blood pressure Approach: midline Location: L3-L4 Injection technique: LOR saline  Needle:  Needle type: Tuohy  Needle gauge: 17 G Needle length: 9 cm Catheter type: closed end flexible Catheter size: 20 Guage Test dose: negative and 1.5% lidocaine  Assessment Events: blood not aspirated, injection not painful, no injection resistance and no paresthesia  Additional Notes LOR @ 8  Patient identified. Risks/Benefits/Options discussed with patient including but not limited to bleeding, infection, nerve damage, paralysis, failed block, incomplete pain control, headache, blood pressure changes, nausea, vomiting, reactions to medications, itching and postpartum back pain. Confirmed with bedside nurse the patient's most recent platelet count. Confirmed with patient that they are not currently taking any anticoagulation, have any bleeding history or any family history of bleeding disorders. Patient expressed understanding and wished to proceed. All questions were answered. Sterile technique was used throughout the entire procedure. Please see nursing notes for vital signs. Test dose was given through epidural catheter and negative prior to continuing to dose epidural or start infusion. Warning signs of high block given to the patient including shortness of breath, tingling/numbness in hands, complete motor block, or any concerning symptoms with instructions to call for help. Patient was given instructions on  fall risk and not to get out of bed. All questions and concerns addressed with instructions to call with any issues or inadequate analgesia.    Reason for block:procedure for pain

## 2015-07-29 ENCOUNTER — Encounter (HOSPITAL_COMMUNITY): Payer: Self-pay

## 2015-07-29 LAB — CBC
HCT: 35.8 % — ABNORMAL LOW (ref 36.0–46.0)
Hemoglobin: 11.9 g/dL — ABNORMAL LOW (ref 12.0–15.0)
MCH: 32.1 pg (ref 26.0–34.0)
MCHC: 33.2 g/dL (ref 30.0–36.0)
MCV: 96.5 fL (ref 78.0–100.0)
PLATELETS: 207 10*3/uL (ref 150–400)
RBC: 3.71 MIL/uL — ABNORMAL LOW (ref 3.87–5.11)
RDW: 15.2 % (ref 11.5–15.5)
WBC: 14 10*3/uL — AB (ref 4.0–10.5)

## 2015-07-29 LAB — CCBB MATERNAL DONOR DRAW

## 2015-07-29 NOTE — Progress Notes (Addendum)
POSTOPERATIVE DAY # 1 S/P CS   S:         Reports feeling ok             Tolerating po intake / no nausea / no vomiting / no flatus / no BM             Bleeding is light             Pain controlled with motrin             Up ad lib / ambulatory/ voiding QS  Newborn breast feeding  / planned today  O:  VS: BP 122/61 mmHg  Pulse 67  Temp(Src) 98.6 F (37 C) (Oral)  Resp 20  Ht 5\' 7"  (1.702 m)  Wt 132.904 kg (293 lb)  BMI 45.88 kg/m2  SpO2 97%  Breastfeeding? Unknown   LABS:               Recent Labs  07/28/15 1131 07/29/15 0546  WBC 10.6* 14.0*  HGB 11.7* 11.9*  PLT 171 207               Bloodtype: --/--/A POS, A POS (04/27 0115)  Rubella: Immune (09/30 0000)                                             I&O: Intake/Output      04/27 0701 - 04/28 0700 04/28 0701 - 04/29 0700   P.O. 480 120   I.V. (mL/kg) 4345.8 (32.7)    Total Intake(mL/kg) 4825.8 (36.3) 120 (0.9)   Urine (mL/kg/hr) 1725 (0.5) 100 (0.1)   Blood 500 (0.2)    Total Output 2225 100   Net +2600.8 +20                     Physical Exam:             Alert and Oriented X3  Lungs: Clear and unlabored  Heart: regular rate and rhythm / no mumurs  Abdomen: soft, non-tender, non-distended              Fundus: firm, non-tender, Ueven             Dressing intact honeycomb              Incision:  approximated with suture / no erythema / no ecchymosis / no drainage  Perineum: intact  Lochia: light  Extremities:  2+ edema, no calf pain or tenderness  A:        POD # 1 S/P CS            PIH - BP stable             P:        Routine postoperative care        Marlinda MikeBAILEY, Soraiya Ahner CNM, MSN, [facility]FACNM 07/29/2015, 2:01 PM

## 2015-07-29 NOTE — Lactation Note (Signed)
This note was copied from a baby's chart. Lactation Consultation Note; Mom reports baby has been nursing well with no pain. Last LS by RN 9. Baby just back from circ and is asleep in bassinet Reviewed normal behavior after circ and cluster feeding the second night. No questions at present. To call for assist prn  Patient Name: [name] ProwsCatina Samaras [id number]'XToday's Date: 07/29/2015 Reason for consult: Follow-up assessment   Maternal Data Formula Feeding for Exclusion: No Does the patient have breastfeeding experience prior to this delivery?: No  Feeding    LATCH Score/Interventions                      Lactation Tools Discussed/Used     Consult Status Consult Status: Follow-up Date: 07/30/15 Follow-up type: In-patient    Pamelia HoitWeeks, Vrishank Moster D 07/29/2015, 1:21 PM

## 2015-07-30 MED ORDER — HYDROCHLOROTHIAZIDE 25 MG PO TABS
25.0000 mg | ORAL_TABLET | Freq: Every day | ORAL | Status: DC
  Administered 2015-07-30 – 2015-07-31 (×2): 25 mg via ORAL
  Filled 2015-07-30 (×2): qty 1

## 2015-07-30 NOTE — Progress Notes (Signed)
POSTOPERATIVE DAY # 2 S/P CS  S:         Reports feeling sore and more swollen today in feet - painful to walk              No PIH symptoms             Tolerating po intake / no nausea / no vomiting / + flatus / no BM             Bleeding is light             Pain controlled with motrin and percocet             Up ad lib / ambulatory/ voiding QS  Newborn breast feeding    O:  VS: BP 139/85 mmHg  Pulse 70  Temp(Src) 97.8 F (36.6 C) (Oral)  Resp 18  Ht 5\' 7"  (1.702 m)  Wt 132.904 kg (293 lb)  BMI 45.88 kg/m2  SpO2 97%  Breastfeeding? Unknown              BP: 139/85 - 140/73 - 122/61 - 133/75   LABS:               Recent Labs  07/28/15 1131 07/29/15 0546  WBC 10.6* 14.0*  HGB 11.7* 11.9*  PLT 171 207               Bloodtype: --/--/A POS, A POS (04/27 0115)  Rubella: Immune (09/30 0000)                                            I&O: Intake/Output      04/28 0701 - 04/29 0700 04/29 0701 - 04/30 0700   P.O. 840    I.V. (mL/kg)     Total Intake(mL/kg) 840 (6.3)    Urine (mL/kg/hr) 600 (0.2)    Blood     Total Output 600     Net +240                       Physical Exam:             Alert and Oriented X3  Lungs: Clear and unlabored  Heart: regular rate and rhythm / no mumurs  Abdomen: soft, non-tender, non-distended             Fundus: firm, non-tender, Ueven             Dressing intact honeycomb              Incision:  approximated with suture / no erythema / no ecchymosis / no drainage  Extremities: 3+ edema ankle and pedal, no calf pain or tenderness, negative Homans  A:        POD # 2 S/P CS            PIH - stable BP            Worsening dependent edema  P:        Routine postoperative care              Add HCTZ today - increase water intake / weight today and tomorrow   Marlinda MikeBAILEY, TANYA CNM, MSN, FACNM 07/30/2015, 9:31 AM

## 2015-07-30 NOTE — Lactation Note (Signed)
This note was copied from a baby's chart. Lactation Consultation Note  Patient Name: [name]: 07/30/2015 Reason for consult: Follow-up assessment Baby at 54 hr of life and mom reports bf is going well. She denies breast or nipple pain, no concerns voiced. Discussed baby behavior, feeding frequency, baby belly size, voids, wt loss, breast changes, and nipple care. Mom stated she can manually express, has seen colostrum bilaterally, and has a spoon in the room. She is aware of lactation services and will call for help as needed.    Maternal Data    Feeding Feeding Type: Breast Fed Length of feed: 30 min  LATCH Score/Interventions                      Lactation Tools Discussed/Used     Consult Status Consult Status: PRN    [name] 07/30/2015, 4:44 PM

## 2015-07-31 MED ORDER — OXYCODONE-ACETAMINOPHEN 5-325 MG PO TABS: 1.0000 | ORAL_TABLET | ORAL | Status: DC | PRN

## 2015-07-31 MED ORDER — HYDROCHLOROTHIAZIDE 25 MG PO TABS: 25.0000 mg | ORAL_TABLET | Freq: Every day | ORAL | Status: DC

## 2015-07-31 MED ORDER — FUROSEMIDE 20 MG PO TABS
20.0000 mg | ORAL_TABLET | ORAL | Status: AC
  Administered 2015-07-31: 20 mg via ORAL
  Filled 2015-07-31: qty 1

## 2015-07-31 MED ORDER — IBUPROFEN 600 MG PO TABS: 600.0000 mg | ORAL_TABLET | Freq: Four times a day (QID) | ORAL | Status: DC

## 2015-07-31 NOTE — Discharge Summary (Signed)
POSTOPERATIVE DISCHARGE SUMMARY:  Patient ID: [name] MRN: [id number] DOB/AGE: 1971-01-23 45 y.o.  Admit date: 07/28/2015 Admission Diagnoses: 38.6 weeks / PIH  Discharge date:  07/31/2015 Discharge Diagnoses: POD 3 s/p cesarean section / PIH - stable / dependent edema  Prenatal history: G1P1001   EDC : 08/05/2015, Alternate EDD Entry  [facility] care at [facility]  Primary provider : [facility] Prenatal course complicated by AMA / hypertension / low AFI  Prenatal Labs: ABO, Rh: --/--/A POS, A POS (04/27 0115) Antibody: NEG (04/27 0115) Rubella: Immune (09/30 0000)   RPR: Non Reactive (04/27 0115)  HBsAg: Negative (09/30 0000)  HIV: Non-reactive (09/30 0000)  GBS: Positive (04/07 0000)   Medical / Surgical History :  Past medical history:  Past Medical History  Diagnosis Date  . No significant past medical history   . Varicose veins   . Pregnancy induced hypertension   . Anemia   . Vitamin D deficiency   . Postpartum care following cesarean delivery (4/27) 07/28/2015    Past surgical history:  Past Surgical History  Procedure Laterality Date  . Foot surgery    . Toe surgery Bilateral   . Cesarean section N/A 07/28/2015    Procedure: CESAREAN SECTION;  Surgeon: Olivia Mackie, MD;  Location: [facility];  Service: Obstetrics;  Laterality: N/A;    Family History:  Family History  Problem Relation Age of Onset  . Heart disease Mother   . Diabetes Mother   . Hypertension Mother   . Cancer Father   . Hypertension Father   . Asthma Sister   . Seizures Sister   . Bipolar disorder Sister     Social History:  reports that she has quit smoking. She has never used smokeless tobacco. She reports that she does not drink alcohol or use illicit drugs.  Allergies: Review of patient's allergies indicates no known allergies.   Current Medications at time of admission:  Prior to Admission medications   Medication Sig Start Date End Date Taking? Authorizing  Provider  acetaminophen (TYLENOL) 500 MG tablet Take 500 mg by mouth every 6 (six) hours as needed for mild pain.   Yes Historical Provider, MD  cetirizine (ZYRTEC) 10 MG tablet Take 10 mg by mouth 2 (two) times daily.   Yes Historical Provider, MD  cholecalciferol (VITAMIN D) 1000 units tablet Take 1,000 Units by mouth daily.   Yes Historical Provider, MD  diphenhydrAMINE (BENADRYL) 25 MG tablet Take 25 mg by mouth daily as needed for allergies.   Yes Historical Provider, MD  FERREX 150 150 MG capsule Take 150 mg by mouth daily. 05/21/15  Yes Historical Provider, MD  labetalol (NORMODYNE) 100 MG tablet Take 100 mg by mouth 3 (three) times daily.   Yes Historical Provider, MD  pantoprazole (PROTONIX) 40 MG tablet Take 1 tablet (40 mg total) by mouth daily. 07/11/15  Yes Sheronette Cousins, MD  Prenat w/o A-FeCbGl-DSS-FA-DHA (CITRANATAL DHA) 27-1 & 250 MG tablet Take 1 tablet by mouth daily.   Yes Historical Provider, MD    Intrapartum Course:  Admit for induction labor with NRFHR Pain management: epidural Interventions required: cesarean section  Procedures: Cesarean section delivery on 07/28/2015 with delivery of  Viable newborn by Dr Billy Coast   See operative report for further details APGAR (1 MIN): 9   APGAR (5 MINS): 9    Postoperative / postpartum course:  Uncomplicated with discharge on POD 3 PIH stable on oral agent Dependent edema   Discharge Instructions:  Discharged  Condition: stable  Activity: pelvic rest and postoperative restrictions x 2   Diet: routine  Medications:    Medication List    TAKE these medications        acetaminophen 500 MG tablet  Commonly known as:  TYLENOL  Take 500 mg by mouth every 6 (six) hours as needed for mild pain.     cetirizine 10 MG tablet  Commonly known as:  ZYRTEC  Take 10 mg by mouth 2 (two) times daily.     cholecalciferol 1000 units tablet  Commonly known as:  VITAMIN D  Take 1,000 Units by mouth daily.     CITRANATAL  DHA 27-1 & 250 MG tablet  Take 1 tablet by mouth daily.     diphenhydrAMINE 25 MG tablet  Commonly known as:  BENADRYL  Take 25 mg by mouth daily as needed for allergies.     FERREX 150 150 MG capsule  Generic drug:  iron polysaccharides  Take 150 mg by mouth daily.     hydrochlorothiazide 25 MG tablet  Commonly known as:  HYDRODIURIL  Take 1 tablet (25 mg total) by mouth daily.     ibuprofen 600 MG tablet  Commonly known as:  ADVIL,MOTRIN  Take 1 tablet (600 mg total) by mouth every 6 (six) hours.     labetalol 100 MG tablet  Commonly known as:  NORMODYNE  Take 100 mg by mouth 3 (three) times daily.     oxyCODONE-acetaminophen 5-325 MG tablet  Commonly known as:  PERCOCET/ROXICET  Take 1 tablet by mouth every 4 (four) hours as needed (pain scale 4-7).     pantoprazole 40 MG tablet  Commonly known as:  PROTONIX  Take 1 tablet (40 mg total) by mouth daily.        Wound Care: keep clean and dry / remove honeycomb POD 5 Postpartum Instructions: [facility] discharge booklet - instructions reviewed  Discharge to: Home  Follow up :  [facility] BP recheck in 1 week [facility] in 6 weeks for routine postpartum visit with Dr Billy Coastaavon                Signed: Marlinda MikeBAILEY, Mava Suares CNM, MSN, [facility]ACNM 07/31/2015, 10:44 AM

## 2015-07-31 NOTE — Lactation Note (Addendum)
This note was copied from a baby's chart. Lactation Consultation Note  Baby unlatched as LC came in room. Mother  Asked how to know baby has had enough. Discussed feeding cues.  Reviewed engorgement care and monitoring voids/stools. Reminded mother to breastfeed on both breasts during feeding. Provided mother w/ manual pump.   Patient Name: Boy Adrian ProwsCatina Mckeithan WUJWJ'XToday's Date: 07/31/2015 Reason for consult: Follow-up assessment   Maternal Data    Feeding Feeding Type: Breast Fed Length of feed: 20 min  LATCH Score/Interventions                      Lactation Tools Discussed/Used     Consult Status Consult Status: Complete    Hardie PulleyBerkelhammer, Ruth Boschen 07/31/2015, 9:38 AM

## 2015-07-31 NOTE — Discharge Instructions (Signed)
Home nurse visit this week to recheck BP and swelling / newborn visit. Next week OFFICE visit with Dr Billy Coastaavon. Continue labetalol 100mg  three tiems daily. Drink water daily - limit extra salt in diet. Take HCTZ 25 mg daily to reduce swelling in legs. Elevate feet at rest.

## 2015-07-31 NOTE — Progress Notes (Signed)
POSTOPERATIVE DAY # 3 S/P CS  S:         Reports feeling ok - ready to go home / no PIH symptoms             Tolerating po intake / no nausea / no vomiting / + flatus / no BM             Bleeding is light             Pain controlled with motrin and percocet             Up ad lib / ambulatory/ voiding QS  Newborn breast feeding  / Circumcision done  O:  VS: BP 140/73 mmHg  Pulse 81  Temp(Src) 98.2 F (36.8 C) (Oral)  Resp 18  Ht 5\' 7"  (1.702 m)  Wt 135.444 kg (298 lb 9.6 oz)  BMI 46.76 kg/m2  SpO2 99%  Breastfeeding? Unknown   LABS:               Recent Labs  07/28/15 1131 07/29/15 0546  WBC 10.6* 14.0*  HGB 11.7* 11.9*  PLT 171 207               Bloodtype: --/--/A POS, A POS (04/27 0115)  Rubella: Immune (09/30 0000)                                             I&O: net + 3 liter                      Weight yesterday 135.44 / today 134.53             Physical Exam:             Alert and Oriented X3  Lungs: Clear and unlabored  Heart: regular rate and rhythm / no mumurs  Abdomen: soft, non-tender, non-distended              Fundus: firm, non-tender, Ueven             Dressing intact honeycomb              Incision:  approximated with suture / no erythema / no ecchymosis / no drainage  Lochia: scant  Extremities: 3+ edema, no calf pain or tenderness, negative Homans  A:        POD # 3 S/P CS            PIH - Bp stable / no symptoms            Dependent edema - stable on HCTZ no significant improvement  P:        Routine postoperative care              Lasix dose now then continue HCTZ x 7 days with increase WATER hydration / reduction in salt in diet / BP & edema recheck with home nurse visit this week     [name], [name] CNM, MSN, [facility] IncFACNM 07/31/2015, 10:35 AM

## 2016-07-11 ENCOUNTER — Other Ambulatory Visit: Payer: Self-pay | Admitting: Family Medicine

## 2016-07-11 DIAGNOSIS — M5430 Sciatica, unspecified side: Secondary | ICD-10-CM

## 2016-07-11 DIAGNOSIS — M545 Low back pain: Secondary | ICD-10-CM

## 2016-07-17 ENCOUNTER — Other Ambulatory Visit: Payer: Self-pay

## 2016-07-22 ENCOUNTER — Ambulatory Visit
Admission: RE | Admit: 2016-07-22 | Discharge: 2016-07-22 | Disposition: A | Discharge status: Home / Self Care | Payer: BLUE CROSS/BLUE SHIELD | Source: Ambulatory Visit | Attending: Family Medicine | Admitting: Family Medicine

## 2016-07-22 DIAGNOSIS — M545 Low back pain: Secondary | ICD-10-CM

## 2016-07-22 DIAGNOSIS — M5430 Sciatica, unspecified side: Secondary | ICD-10-CM

## 2016-10-23 ENCOUNTER — Other Ambulatory Visit (HOSPITAL_COMMUNITY): Payer: Self-pay | Admitting: Gastroenterology

## 2016-10-23 DIAGNOSIS — R1010 Upper abdominal pain, unspecified: Secondary | ICD-10-CM

## 2016-11-02 ENCOUNTER — Ambulatory Visit (HOSPITAL_COMMUNITY)
Admission: RE | Admit: 2016-11-02 | Discharge: 2016-11-02 | Disposition: A | Discharge status: Home / Self Care | Payer: BLUE CROSS/BLUE SHIELD | Source: Ambulatory Visit | Attending: Gastroenterology | Admitting: Gastroenterology

## 2016-11-02 ENCOUNTER — Encounter (HOSPITAL_COMMUNITY): Payer: Self-pay | Admitting: Radiology

## 2016-11-02 DIAGNOSIS — R1010 Upper abdominal pain, unspecified: Secondary | ICD-10-CM | POA: Insufficient documentation

## 2016-11-02 MED ORDER — TECHNETIUM TC 99M MEBROFENIN IV KIT
5.0000 | PACK | Freq: Once | INTRAVENOUS | Status: AC | PRN
  Administered 2016-11-02: 5 via INTRAVENOUS

## 2016-12-14 ENCOUNTER — Other Ambulatory Visit: Payer: Self-pay | Admitting: General Surgery

## 2016-12-14 DIAGNOSIS — K449 Diaphragmatic hernia without obstruction or gangrene: Secondary | ICD-10-CM

## 2016-12-14 DIAGNOSIS — K219 Gastro-esophageal reflux disease without esophagitis: Secondary | ICD-10-CM

## 2016-12-20 ENCOUNTER — Other Ambulatory Visit: Payer: Self-pay | Admitting: Gastroenterology

## 2016-12-21 ENCOUNTER — Other Ambulatory Visit: Payer: Self-pay

## 2016-12-26 ENCOUNTER — Ambulatory Visit
Admission: RE | Admit: 2016-12-26 | Discharge: 2016-12-26 | Disposition: A | Discharge status: Home / Self Care | Payer: BLUE CROSS/BLUE SHIELD | Source: Ambulatory Visit | Attending: General Surgery | Admitting: General Surgery

## 2016-12-26 DIAGNOSIS — K219 Gastro-esophageal reflux disease without esophagitis: Secondary | ICD-10-CM

## 2016-12-26 DIAGNOSIS — K449 Diaphragmatic hernia without obstruction or gangrene: Secondary | ICD-10-CM

## 2016-12-26 MED ORDER — IOPAMIDOL (ISOVUE-300) INJECTION 61%
125.0000 mL | Freq: Once | INTRAVENOUS | Status: AC | PRN
  Administered 2016-12-26: 125 mL via INTRAVENOUS

## 2017-01-02 ENCOUNTER — Encounter (HOSPITAL_COMMUNITY): Payer: Self-pay

## 2017-01-02 ENCOUNTER — Ambulatory Visit (HOSPITAL_COMMUNITY)
Admission: RE | Admit: 2017-01-02 | Discharge: 2017-01-02 | Disposition: A | Discharge status: Home / Self Care | Payer: BLUE CROSS/BLUE SHIELD | Source: Ambulatory Visit | Attending: Gastroenterology | Admitting: Gastroenterology

## 2017-01-02 ENCOUNTER — Encounter (HOSPITAL_COMMUNITY)
Admission: RE | Disposition: A | Discharge status: Home / Self Care | Payer: Self-pay | Source: Ambulatory Visit | Attending: Gastroenterology

## 2017-01-02 DIAGNOSIS — K449 Diaphragmatic hernia without obstruction or gangrene: Secondary | ICD-10-CM | POA: Insufficient documentation

## 2017-01-02 DIAGNOSIS — K469 Unspecified abdominal hernia without obstruction or gangrene: Secondary | ICD-10-CM | POA: Diagnosis present

## 2017-01-02 HISTORY — PX: ESOPHAGEAL MANOMETRY: SHX5429

## 2017-01-02 SURGERY — MANOMETRY, ESOPHAGUS

## 2017-01-02 MED ORDER — LIDOCAINE VISCOUS 2 % MT SOLN
OROMUCOSAL | Status: AC
  Filled 2017-01-02: qty 15

## 2017-01-02 SURGICAL SUPPLY — 2 items
FACESHIELD LNG OPTICON STERILE (SAFETY) IMPLANT
GLOVE BIO SURGEON STRL SZ8 (GLOVE) ×6 IMPLANT

## 2017-01-02 NOTE — Progress Notes (Signed)
Esophageal Manometry done per protocol.  Pt. Tolerated procedure well, w/o complications.  Dr. Bosie Clos to be made aware today.  Omelia Blackwater, RN

## 2017-01-03 ENCOUNTER — Encounter (HOSPITAL_COMMUNITY): Payer: Self-pay | Admitting: Gastroenterology

## 2017-01-23 ENCOUNTER — Ambulatory Visit: Payer: Self-pay | Admitting: General Surgery

## 2017-01-23 NOTE — H&P (Signed)
History of Present Illness [name](Renee Larivee MD; 01/23/2017 4:26 PM) The patient is a 46 year old female who presents with a hiatal hernia. Patient is a 46 year old female who is referred by Dr. Marca AnconaKarki for evaluation of a hiatal hernia. Patient states that since December 2017 she does some dysphagia she states that her epigastrium. She states that she has a tremendous amount of reflux. She is unable to lay flat. She states that several hours after eating she has reflux of her food that she ate. She states this is both with liquids and solids.  Patient had an endoscopy recently and was seen to have a large hiatal hernia. Patient had a CT scan in approximately 2010 that I reviewed myself and was found to have a hiatal hernia at that time as well. That appeared smaller at that time.  Patient has not had any manometry or radiological studies recently.  Update: Patient had her manometry which revealed no signs of achalasia Patient had a CT scan which revealed a large hiatal hernia with the majority of the stomach to the left portion of her chest. Hiatus appears to be approximate 4 cm.  Patient comes back in with continued reflux, chest pain with eating.   Diagnostic Studies History [name](Christen Lambert, [place]; 01/23/2017 4:15 PM) Colonoscopy  never Mammogram  1-3 years ago Pap Smear  1-5 years ago  Allergies [name](Christen Lambert, RMA; 01/23/2017 4:15 PM) No Known Allergies 12/12/2016  Medication History [name](Christen Lambert, RMA; 01/23/2017 4:15 PM) BuPROPion HCl (100MG  Tablet, Oral) Active. HydroCHLOROthiazide (25MG  Tablet, Oral) Active. Meloxicam (15MG  Tablet, Oral) Active. Multivitamins (Oral) Active. Benadryl (25MG  Tablet, Oral) Active. Pantoprazole Sodium (40MG  Packet, Oral) Active. ZyrTEC (10MG  Tablet, Oral) Active. Ferrous Gluconate (324 (38 Fe)MG Tablet, Oral) Active. Vitamin B12 (1000MCG Tablet ER, Oral) Active. Medications Reconciled  Social History [name](Christen  Lambert, [place]; 01/23/2017 4:15 PM) Alcohol use  Remotely quit alcohol use. Caffeine use  Carbonated beverages, Coffee, Tea. No drug use  Tobacco use  Former smoker.  Family History [name](Christen Lambert, [place]MA; 01/23/2017 4:15 PM) Arthritis  Mother. Cancer  Father. Diabetes Mellitus  Mother. Heart Disease  Mother. Heart disease in female family member before age 45  Hypertension  Brother, Father, Mother.  Pregnancy / Birth History [name](Christen Lambert, [place]; 01/23/2017 4:15 PM) Age at menarche  13 years. Age of menopause  <46 Contraceptive History  Oral contraceptives. Gravida  1 Length (months) of breastfeeding  3-6 Maternal age  26>40 Para  1 Regular periods   Other Problems [name](Christen Lambert, [place]; 01/23/2017 4:15 PM) Arthritis  Gastroesophageal Reflux Disease     Review of Systems [name](Christen Lambert RMA; 01/23/2017 4:15 PM) General Present- Fatigue and Night Sweats. Not Present- Appetite Loss, Chills, Fever, Weight Gain and Weight Loss. Skin Present- Dryness. Not Present- Change in Wart/Mole, Hives, Jaundice, New Lesions, Non-Healing Wounds, Rash and Ulcer. HEENT Not Present- Earache, Hearing Loss, Hoarseness, Nose Bleed, Oral Ulcers, Ringing in the Ears, Seasonal Allergies, Sinus Pain, Sore Throat, Visual Disturbances, Wears glasses/contact lenses and Yellow Eyes. Respiratory Not Present- Bloody sputum, Chronic Cough, Difficulty Breathing, Snoring and Wheezing. Breast Not Present- Breast Mass, Breast Pain, Nipple Discharge and Skin Changes. Cardiovascular Not Present- Chest Pain, Difficulty Breathing Lying Down, Leg Cramps, Palpitations, Rapid Heart Rate, Shortness of Breath and Swelling of Extremities. Gastrointestinal Present- Abdominal Pain, Bloating, Constipation, Difficulty Swallowing, Excessive gas, Gets full quickly at meals, Indigestion, Rectal Pain and Vomiting. Not Present- Bloody Stool, Change in Bowel Habits, Chronic diarrhea, Hemorrhoids and Nausea. Female  Genitourinary Not Present- Frequency, Nocturia,  Painful Urination, Pelvic Pain and Urgency. Musculoskeletal Present- Joint Stiffness, Muscle Pain and Swelling of Extremities. Not Present- Back Pain, Joint Pain and Muscle Weakness. Psychiatric Not Present- Anxiety, Bipolar, Change in Sleep Pattern, Depression, Fearful and Frequent crying. Endocrine Present- Hot flashes. Not Present- Cold Intolerance, Excessive Hunger, Hair Changes, Heat Intolerance and New Diabetes. Hematology Not Present- Blood Thinners, Easy Bruising, Excessive bleeding, Gland problems, HIV and Persistent Infections.  Vitals [name] RMA; 01/23/2017 4:16 PM) 01/23/2017 4:16 PM Weight: 250 lb Height: 66.5in Body Surface Area: 2.21 m Body Mass Index: 39.75 kg/m  Temp.: 97.30F  Pulse: 93 (Regular)  BP: 140/92 (Sitting, Left Arm, Standard)       Physical Exam Renee Filler, MD; 01/23/2017 4:27 PM) The physical exam findings are as follows: Note: Constitutional: No acute distress, conversant, appears stated age  Eyes: Anicteric sclerae, moist conjunctiva, no lid lag  Neck: No thyromegaly, trachea midline, no cervical lymphadenopathy  Lungs: Clear to auscultation biilaterally, normal respiratory effot  Cardiovascular: regular rate & rhythm, no murmurs, no peripheal edema, pedal pulses 2+  GI: Soft, no masses or hepatosplenomegaly, non-tender to palpation  MSK: Normal gait, no clubbing cyanosis, edema  Skin: No rashes, palpation reveals normal skin turgor  Psychiatric: Appropriate judgment and insight, oriented to person, place, and time    Assessment & Plan Renee Filler MD; 01/23/2017 4:26 PM) HIATAL HERNIA WITH GERD (K21.9) Impression: 46 year old female with hiatal hernia. 1. The patient will like to proceed to the operating room for a minimally invasive however hernia repair and Nissen fundoplication. 2. I discussed the risks and benefits the procedure to include but not  limited to: Infection, bleeding, damage structures, possible recurrence, possible pneumothorax and possible need for drain placement. The patient was understanding and wishes to proceed.

## 2017-02-25 ENCOUNTER — Ambulatory Visit: Payer: Self-pay | Admitting: General Surgery

## 2017-03-04 NOTE — Patient Instructions (Signed)
Renee ProwsCatina [name]  03/04/2017   Your procedure is scheduled on: 03-06-17  [facility]  Entrance Take [place]  elevators to 3rd floor to  Short Stay Center at    0630 AM.    Call this number if you have problems the morning of surgery [phone]    Remember: ONLY 1 PERSON MAY GO WITH YOU TO SHORT STAY TO GET  READY MORNING OF YOUR SURGERY.  Do not eat food or drink liquids :After Midnight.     Take these medicines the morning of surgery with A SIP OF WATER: PROTONIX, ZYRTEC, WELLBUTRIN                                You may not have any metal on your body including hair pins and              piercings  Do not wear jewelry, make-up, lotions, powders or perfumes, deodorant             Do not wear nail polish.  Do not shave  48 hours prior to surgery.     Do not bring valuables to the hospital. [place] IS NOT             RESPONSIBLE   FOR VALUABLES.  Contacts, dentures or bridgework may not be worn into surgery.  Leave suitcase in the car. After surgery it may be brought to your room.                 Please read over the following fact sheets you were given: _____________________________________________________________________          [facility] - Preparing for Surgery Before surgery, you can play an important role.  Because skin is not sterile, your skin needs to be as free of germs as possible.  You can reduce the number of germs on your skin by washing with CHG (chlorahexidine gluconate) soap before surgery.  CHG is an antiseptic cleaner which kills germs and bonds with the skin to continue killing germs even after washing. Please DO NOT use if you have an allergy to CHG or antibacterial soaps.  If your skin becomes reddened/irritated stop using the CHG and inform your nurse when you arrive at Short Stay. Do not shave (including legs and underarms) for at least 48 hours prior to the first CHG shower.  You may shave your face/neck. Please follow  these instructions carefully:  1.  Shower with CHG Soap the night before surgery and the  morning of Surgery.  2.  If you choose to wash your hair, wash your hair first as usual with your  normal  shampoo.  3.  After you shampoo, rinse your hair and body thoroughly to remove the  shampoo.                           4.  Use CHG as you would any other liquid soap.  You can apply chg directly  to the skin and wash                       Gently with a scrungie or clean washcloth.  5.  Apply the CHG Soap to your body ONLY FROM THE NECK DOWN.   Do not use on  face/ open                           Wound or open sores. Avoid contact with eyes, ears mouth and genitals (private parts).                       Wash face,  Genitals (private parts) with your normal soap.             6.  Wash thoroughly, paying special attention to the area where your surgery  will be performed.  7.  Thoroughly rinse your body with warm water from the neck down.  8.  DO NOT shower/wash with your normal soap after using and rinsing off  the CHG Soap.                9.  Pat yourself dry with a clean towel.            10.  Wear clean pajamas.            11.  Place clean sheets on your bed the night of your first shower and do not  sleep with pets. Day of Surgery : Do not apply any lotions/deodorants the morning of surgery.  Please wear clean clothes to the hospital/surgery center.  FAILURE TO FOLLOW THESE INSTRUCTIONS MAY RESULT IN THE CANCELLATION OF YOUR SURGERY PATIENT SIGNATURE_________________________________  NURSE SIGNATURE__________________________________  ________________________________________________________________________

## 2017-03-05 ENCOUNTER — Other Ambulatory Visit: Payer: Self-pay

## 2017-03-05 ENCOUNTER — Encounter (HOSPITAL_COMMUNITY)
Admission: RE | Admit: 2017-03-05 | Discharge: 2017-03-05 | Disposition: A | Discharge status: Home / Self Care | Payer: BLUE CROSS/BLUE SHIELD | Source: Ambulatory Visit | Attending: General Surgery | Admitting: General Surgery

## 2017-03-05 ENCOUNTER — Encounter (HOSPITAL_COMMUNITY): Payer: Self-pay

## 2017-03-05 HISTORY — DX: Sleep apnea, unspecified: G47.30

## 2017-03-05 HISTORY — DX: Unspecified osteoarthritis, unspecified site: M19.90

## 2017-03-05 HISTORY — DX: Major depressive disorder, single episode, unspecified: F32.9

## 2017-03-05 HISTORY — DX: Personal history of other diseases of the digestive system: Z87.19

## 2017-03-05 HISTORY — DX: Gastro-esophageal reflux disease without esophagitis: K21.9

## 2017-03-05 HISTORY — DX: Depression, unspecified: F32.A

## 2017-03-05 LAB — BASIC METABOLIC PANEL
ANION GAP: 10 (ref 5–15)
BUN: 12 mg/dL (ref 6–20)
CALCIUM: 9.1 mg/dL (ref 8.9–10.3)
CHLORIDE: 101 mmol/L (ref 101–111)
CO2: 25 mmol/L (ref 22–32)
CREATININE: 0.75 mg/dL (ref 0.44–1.00)
GFR calc non Af Amer: >60 mL/min (ref >60)
Glucose, Bld: 90 mg/dL (ref 65–99)
POTASSIUM: 3.5 mmol/L (ref 3.5–5.1)
SODIUM: 136 mmol/L (ref 135–145)

## 2017-03-05 LAB — CBC
HCT: 40.5 % (ref 36.0–46.0)
HEMOGLOBIN: 13.7 g/dL (ref 12.0–15.0)
MCH: 31.6 pg (ref 26.0–34.0)
MCHC: 33.8 g/dL (ref 30.0–36.0)
MCV: 93.3 fL (ref 78.0–100.0)
Platelets: 277 10*3/uL (ref 150–400)
RBC: 4.34 MIL/uL (ref 3.87–5.11)
RDW: 12.7 % (ref 11.5–15.5)
WBC: 10 10*3/uL (ref 4.0–10.5)

## 2017-03-05 LAB — HCG, SERUM, QUALITATIVE: Preg, Serum: NEGATIVE

## 2017-03-05 MED ORDER — DEXTROSE 5 % IV SOLN
3.0000 g | INTRAVENOUS | Status: AC
  Administered 2017-03-06: 3 g via INTRAVENOUS
  Filled 2017-03-05: qty 3

## 2017-03-05 NOTE — Anesthesia Preprocedure Evaluation (Addendum)
Anesthesia Evaluation   Patient awake    Reviewed: Allergy & Precautions, NPO status , Patient's Chart, lab work & pertinent test results  Airway Mallampati: II  TM Distance: >3 FB Neck ROM: Full    Dental  (+) Dental Advisory Given   Pulmonary sleep apnea , former smoker,    breath sounds clear to auscultation       Cardiovascular hypertension, Pt. on medications negative cardio ROS   Rhythm:Regular Rate:Normal     Neuro/Psych Depression negative neurological ROS     GI/Hepatic Neg liver ROS, hiatal hernia, GERD  ,  Endo/Other  Morbid obesity  Renal/GU negative Renal ROS     Musculoskeletal   Abdominal   Peds  Hematology negative hematology ROS (+)   Anesthesia Other Findings   Reproductive/Obstetrics                            Lab Results  Component Value Date   WBC 10.0 03/05/2017   HGB 13.7 03/05/2017   HCT 40.5 03/05/2017   MCV 93.3 03/05/2017   PLT 277 03/05/2017   Lab Results  Component Value Date   CREATININE 0.75 03/05/2017   BUN 12 03/05/2017   NA 136 03/05/2017   K 3.5 03/05/2017   CL 101 03/05/2017   CO2 25 03/05/2017    Anesthesia Physical Anesthesia Plan  ASA: II  Anesthesia Plan: General   Post-op Pain Management:    Induction: Intravenous  PONV Risk Score and Plan: 4 or greater and Midazolam, Scopolamine patch - Pre-op, Dexamethasone, Ondansetron and Treatment may vary due to age or medical condition  Airway Management Planned: Oral ETT  Additional Equipment:   Intra-op Plan:   Post-operative Plan: Extubation in OR  Informed Consent: I have reviewed the patients History and Physical, chart, labs and discussed the procedure including the risks, benefits and alternatives for the proposed anesthesia with the patient or authorized representative who has indicated his/her understanding and acceptance.   Dental advisory given  Plan Discussed with:  CRNA  Anesthesia Plan Comments:        Anesthesia Quick Evaluation

## 2017-03-06 ENCOUNTER — Inpatient Hospital Stay (HOSPITAL_COMMUNITY)
Admission: RE | Admit: 2017-03-06 | Discharge: 2017-03-08 | DRG: 328 | Disposition: A | Discharge status: Home / Self Care | Payer: BLUE CROSS/BLUE SHIELD | Source: Ambulatory Visit | Attending: General Surgery | Admitting: General Surgery

## 2017-03-06 ENCOUNTER — Ambulatory Visit (HOSPITAL_COMMUNITY): Payer: BLUE CROSS/BLUE SHIELD | Admitting: Anesthesiology

## 2017-03-06 ENCOUNTER — Encounter (HOSPITAL_COMMUNITY)
Admission: RE | Disposition: A | Discharge status: Home / Self Care | Payer: Self-pay | Source: Ambulatory Visit | Attending: General Surgery

## 2017-03-06 ENCOUNTER — Encounter (HOSPITAL_COMMUNITY): Payer: Self-pay | Admitting: Certified Registered Nurse Anesthetist

## 2017-03-06 DIAGNOSIS — G4730 Sleep apnea, unspecified: Secondary | ICD-10-CM | POA: Diagnosis present

## 2017-03-06 DIAGNOSIS — Z87891 Personal history of nicotine dependence: Secondary | ICD-10-CM

## 2017-03-06 DIAGNOSIS — K449 Diaphragmatic hernia without obstruction or gangrene: Principal | ICD-10-CM | POA: Diagnosis present

## 2017-03-06 DIAGNOSIS — M1990 Unspecified osteoarthritis, unspecified site: Secondary | ICD-10-CM | POA: Diagnosis present

## 2017-03-06 DIAGNOSIS — Z6838 Body mass index (BMI) 38.0-38.9, adult: Secondary | ICD-10-CM

## 2017-03-06 DIAGNOSIS — K219 Gastro-esophageal reflux disease without esophagitis: Secondary | ICD-10-CM | POA: Diagnosis present

## 2017-03-06 DIAGNOSIS — F329 Major depressive disorder, single episode, unspecified: Secondary | ICD-10-CM | POA: Diagnosis present

## 2017-03-06 DIAGNOSIS — Z9889 Other specified postprocedural states: Secondary | ICD-10-CM | POA: Diagnosis present

## 2017-03-06 DIAGNOSIS — Z79899 Other long term (current) drug therapy: Secondary | ICD-10-CM

## 2017-03-06 HISTORY — PX: INSERTION OF MESH: SHX5868

## 2017-03-06 SURGERY — FUNDOPLICATION, NISSEN, ROBOT-ASSISTED, LAPAROSCOPIC
Anesthesia: General | Site: Abdomen

## 2017-03-06 MED ORDER — ONDANSETRON HCL 4 MG/2ML IJ SOLN: 4.0000 mg | Freq: Four times a day (QID) | INTRAMUSCULAR | Status: DC | PRN

## 2017-03-06 MED ORDER — FENTANYL CITRATE (PF) 250 MCG/5ML IJ SOLN
INTRAMUSCULAR | Status: AC
  Filled 2017-03-06: qty 5

## 2017-03-06 MED ORDER — ONDANSETRON 4 MG PO TBDP: 4.0000 mg | ORAL_TABLET | Freq: Four times a day (QID) | ORAL | Status: DC | PRN

## 2017-03-06 MED ORDER — LIDOCAINE 2% (20 MG/ML) 5 ML SYRINGE
INTRAMUSCULAR | Status: DC | PRN
  Administered 2017-03-06: 100 mg via INTRAVENOUS

## 2017-03-06 MED ORDER — BUPIVACAINE-EPINEPHRINE 0.25% -1:200000 IJ SOLN
INTRAMUSCULAR | Status: DC | PRN
  Administered 2017-03-06: 6 mL

## 2017-03-06 MED ORDER — FENTANYL CITRATE (PF) 100 MCG/2ML IJ SOLN
INTRAMUSCULAR | Status: AC
  Filled 2017-03-06: qty 4

## 2017-03-06 MED ORDER — ENOXAPARIN SODIUM 40 MG/0.4ML ~~LOC~~ SOLN
40.0000 mg | SUBCUTANEOUS | Status: DC
  Administered 2017-03-06 – 2017-03-07 (×2): 40 mg via SUBCUTANEOUS
  Filled 2017-03-06 (×2): qty 0.4

## 2017-03-06 MED ORDER — KETAMINE HCL 10 MG/ML IJ SOLN
INTRAMUSCULAR | Status: AC
  Filled 2017-03-06: qty 1

## 2017-03-06 MED ORDER — DEXAMETHASONE SODIUM PHOSPHATE 10 MG/ML IJ SOLN
INTRAMUSCULAR | Status: DC | PRN
  Administered 2017-03-06: 10 mg via INTRAVENOUS

## 2017-03-06 MED ORDER — LABETALOL HCL 5 MG/ML IV SOLN
INTRAVENOUS | Status: AC
  Filled 2017-03-06: qty 4

## 2017-03-06 MED ORDER — ROCURONIUM BROMIDE 50 MG/5ML IV SOSY
PREFILLED_SYRINGE | INTRAVENOUS | Status: AC
  Filled 2017-03-06: qty 5

## 2017-03-06 MED ORDER — 0.9 % SODIUM CHLORIDE (POUR BTL) OPTIME
TOPICAL | Status: DC | PRN
  Administered 2017-03-06: 1000 mL

## 2017-03-06 MED ORDER — HYDROCHLOROTHIAZIDE 25 MG PO TABS
25.0000 mg | ORAL_TABLET | Freq: Every day | ORAL | Status: DC
  Administered 2017-03-07 – 2017-03-08 (×2): 25 mg via ORAL
  Filled 2017-03-06 (×2): qty 1

## 2017-03-06 MED ORDER — SUCCINYLCHOLINE CHLORIDE 200 MG/10ML IV SOSY
PREFILLED_SYRINGE | INTRAVENOUS | Status: AC
  Filled 2017-03-06: qty 10

## 2017-03-06 MED ORDER — LACTATED RINGERS IV SOLN
INTRAVENOUS | Status: DC | PRN
  Administered 2017-03-06: 08:00:00 via INTRAVENOUS

## 2017-03-06 MED ORDER — PROPOFOL 10 MG/ML IV BOLUS
INTRAVENOUS | Status: AC
  Filled 2017-03-06: qty 20

## 2017-03-06 MED ORDER — PROPOFOL 10 MG/ML IV BOLUS
INTRAVENOUS | Status: DC | PRN
  Administered 2017-03-06: 180 mg via INTRAVENOUS

## 2017-03-06 MED ORDER — ROCURONIUM BROMIDE 10 MG/ML (PF) SYRINGE
PREFILLED_SYRINGE | INTRAVENOUS | Status: DC | PRN
  Administered 2017-03-06 (×2): 20 mg via INTRAVENOUS
  Administered 2017-03-06: 50 mg via INTRAVENOUS

## 2017-03-06 MED ORDER — SUGAMMADEX SODIUM 200 MG/2ML IV SOLN
INTRAVENOUS | Status: AC
  Filled 2017-03-06: qty 4

## 2017-03-06 MED ORDER — ONDANSETRON HCL 4 MG/2ML IJ SOLN
4.0000 mg | Freq: Once | INTRAMUSCULAR | Status: AC | PRN
  Administered 2017-03-06: 4 mg via INTRAVENOUS

## 2017-03-06 MED ORDER — SCOPOLAMINE 1 MG/3DAYS TD PT72
MEDICATED_PATCH | TRANSDERMAL | Status: DC | PRN
  Administered 2017-03-06: 1 via TRANSDERMAL

## 2017-03-06 MED ORDER — GABAPENTIN 300 MG PO CAPS
300.0000 mg | ORAL_CAPSULE | ORAL | Status: AC
  Administered 2017-03-06: 300 mg via ORAL
  Filled 2017-03-06: qty 1

## 2017-03-06 MED ORDER — LABETALOL HCL 5 MG/ML IV SOLN
INTRAVENOUS | Status: DC | PRN
  Administered 2017-03-06: 5 mg via INTRAVENOUS
  Administered 2017-03-06: 2.5 mg via INTRAVENOUS

## 2017-03-06 MED ORDER — KETAMINE HCL 10 MG/ML IJ SOLN
INTRAMUSCULAR | Status: DC | PRN
  Administered 2017-03-06: 40 mg via INTRAVENOUS

## 2017-03-06 MED ORDER — PROMETHAZINE HCL 25 MG/ML IJ SOLN: 6.2500 mg | INTRAMUSCULAR | Status: DC | PRN

## 2017-03-06 MED ORDER — BUPIVACAINE-EPINEPHRINE (PF) 0.25% -1:200000 IJ SOLN
INTRAMUSCULAR | Status: AC
Start: 2017-03-06 — End: 2017-03-06
  Filled 2017-03-06: qty 30

## 2017-03-06 MED ORDER — FENTANYL CITRATE (PF) 100 MCG/2ML IJ SOLN
25.0000 ug | INTRAMUSCULAR | Status: DC | PRN
  Administered 2017-03-06: 50 ug via INTRAVENOUS
  Administered 2017-03-06 (×2): 25 ug via INTRAVENOUS
  Administered 2017-03-06: 50 ug via INTRAVENOUS

## 2017-03-06 MED ORDER — LIDOCAINE 2% (20 MG/ML) 5 ML SYRINGE
INTRAMUSCULAR | Status: AC
  Filled 2017-03-06: qty 5

## 2017-03-06 MED ORDER — MIDAZOLAM HCL 5 MG/5ML IJ SOLN
INTRAMUSCULAR | Status: DC | PRN
  Administered 2017-03-06: 2 mg via INTRAVENOUS

## 2017-03-06 MED ORDER — SUCCINYLCHOLINE CHLORIDE 200 MG/10ML IV SOSY
PREFILLED_SYRINGE | INTRAVENOUS | Status: DC | PRN
  Administered 2017-03-06: 120 mg via INTRAVENOUS

## 2017-03-06 MED ORDER — CHLORHEXIDINE GLUCONATE CLOTH 2 % EX PADS: 6.0000 | MEDICATED_PAD | Freq: Once | CUTANEOUS | Status: DC

## 2017-03-06 MED ORDER — ONDANSETRON HCL 4 MG/2ML IJ SOLN
INTRAMUSCULAR | Status: AC
  Filled 2017-03-06: qty 2

## 2017-03-06 MED ORDER — DEXTROSE-NACL 5-0.9 % IV SOLN
INTRAVENOUS | Status: DC
  Administered 2017-03-06 – 2017-03-07 (×3): via INTRAVENOUS

## 2017-03-06 MED ORDER — DEXAMETHASONE SODIUM PHOSPHATE 10 MG/ML IJ SOLN
INTRAMUSCULAR | Status: AC
  Filled 2017-03-06: qty 1

## 2017-03-06 MED ORDER — CELECOXIB 200 MG PO CAPS
200.0000 mg | ORAL_CAPSULE | ORAL | Status: AC
  Administered 2017-03-06: 200 mg via ORAL
  Filled 2017-03-06: qty 1

## 2017-03-06 MED ORDER — SUGAMMADEX SODIUM 200 MG/2ML IV SOLN
INTRAVENOUS | Status: DC | PRN
  Administered 2017-03-06: 300 mg via INTRAVENOUS

## 2017-03-06 MED ORDER — SCOPOLAMINE 1 MG/3DAYS TD PT72
MEDICATED_PATCH | TRANSDERMAL | Status: AC
  Filled 2017-03-06: qty 1

## 2017-03-06 MED ORDER — ACETAMINOPHEN 500 MG PO TABS
1000.0000 mg | ORAL_TABLET | ORAL | Status: AC
  Administered 2017-03-06: 1000 mg via ORAL
  Filled 2017-03-06: qty 2

## 2017-03-06 MED ORDER — FENTANYL CITRATE (PF) 250 MCG/5ML IJ SOLN
INTRAMUSCULAR | Status: DC | PRN
  Administered 2017-03-06: 50 ug via INTRAVENOUS
  Administered 2017-03-06 (×2): 100 ug via INTRAVENOUS

## 2017-03-06 MED ORDER — LACTATED RINGERS IR SOLN
Status: DC | PRN
  Administered 2017-03-06: 1000 mL

## 2017-03-06 MED ORDER — LIDOCAINE 2% (20 MG/ML) 5 ML SYRINGE
INTRAMUSCULAR | Status: DC | PRN
  Administered 2017-03-06: 1.5 mg/kg/h via INTRAVENOUS

## 2017-03-06 MED ORDER — HYDROMORPHONE HCL 1 MG/ML IJ SOLN
1.0000 mg | INTRAMUSCULAR | Status: DC | PRN
  Administered 2017-03-06 – 2017-03-07 (×3): 1 mg via INTRAVENOUS
  Administered 2017-03-07: 2 mg via INTRAVENOUS
  Filled 2017-03-06 (×2): qty 1
  Filled 2017-03-06: qty 2
  Filled 2017-03-06: qty 1

## 2017-03-06 MED ORDER — LABETALOL HCL 5 MG/ML IV SOLN
5.0000 mg | Freq: Once | INTRAVENOUS | Status: AC
  Administered 2017-03-06: 5 mg via INTRAVENOUS

## 2017-03-06 MED ORDER — PHENYLEPHRINE 40 MCG/ML (10ML) SYRINGE FOR IV PUSH (FOR BLOOD PRESSURE SUPPORT)
PREFILLED_SYRINGE | INTRAVENOUS | Status: DC | PRN
  Administered 2017-03-06: 80 ug via INTRAVENOUS

## 2017-03-06 MED ORDER — MIDAZOLAM HCL 2 MG/2ML IJ SOLN
INTRAMUSCULAR | Status: AC
  Filled 2017-03-06: qty 2

## 2017-03-06 SURGICAL SUPPLY — 57 items
ADH SKN CLS APL DERMABOND .7 (GAUZE/BANDAGES/DRESSINGS) ×1
APPLIER CLIP 5 13 M/L LIGAMAX5 (MISCELLANEOUS)
APPLIER CLIP ROT 10 11.4 M/L (STAPLE)
APR CLP MED LRG 11.4X10 (STAPLE)
APR CLP MED LRG 5 ANG JAW (MISCELLANEOUS)
BLADE SURG SZ11 CARB STEEL (BLADE) ×3 IMPLANT
CHLORAPREP W/TINT 26ML (MISCELLANEOUS) ×3 IMPLANT
CLIP APPLIE 5 13 M/L LIGAMAX5 (MISCELLANEOUS) IMPLANT
CLIP APPLIE ROT 10 11.4 M/L (STAPLE) IMPLANT
CLIP VESOLOCK LG 6/CT PURPLE (CLIP) IMPLANT
CLIP VESOLOCK MED LG 6/CT (CLIP) IMPLANT
COVER SURGICAL LIGHT HANDLE (MISCELLANEOUS) ×3 IMPLANT
COVER TIP SHEARS 8 DVNC (MISCELLANEOUS) IMPLANT
COVER TIP SHEARS 8MM DA VINCI (MISCELLANEOUS)
DERMABOND ADVANCED (GAUZE/BANDAGES/DRESSINGS) ×2
DERMABOND ADVANCED .7 DNX12 (GAUZE/BANDAGES/DRESSINGS) IMPLANT
DRAIN PENROSE 18X1/2 LTX STRL (DRAIN) ×3 IMPLANT
DRAPE ARM DVNC X/XI (DISPOSABLE) ×4 IMPLANT
DRAPE COLUMN DVNC XI (DISPOSABLE) ×1 IMPLANT
DRAPE DA VINCI XI ARM (DISPOSABLE) ×8
DRAPE DA VINCI XI COLUMN (DISPOSABLE) ×2
ELECT REM PT RETURN 15FT ADLT (MISCELLANEOUS) ×3 IMPLANT
ENDOLOOP SUT PDS II  0 18 (SUTURE)
ENDOLOOP SUT PDS II 0 18 (SUTURE) IMPLANT
GAUZE SPONGE 4X4 16PLY XRAY LF (GAUZE/BANDAGES/DRESSINGS) ×3 IMPLANT
GLOVE BIO SURGEON STRL SZ7.5 (GLOVE) ×6 IMPLANT
GOWN STRL REUS W/TWL XL LVL3 (GOWN DISPOSABLE) ×12 IMPLANT
IRRIG SUCT STRYKERFLOW 2 WTIP (MISCELLANEOUS) ×3
IRRIGATION SUCT STRKRFLW 2 WTP (MISCELLANEOUS) IMPLANT
KIT BASIN OR (CUSTOM PROCEDURE TRAY) ×3 IMPLANT
MARKER SKIN DUAL TIP RULER LAB (MISCELLANEOUS) ×3 IMPLANT
MESH BIO-A 7X10 SYN MAT (Mesh General) ×2 IMPLANT
NDL INSUFFLATION 14GA 120MM (NEEDLE) ×1 IMPLANT
NEEDLE HYPO 22GX1.5 SAFETY (NEEDLE) ×3 IMPLANT
NEEDLE INSUFFLATION 14GA 120MM (NEEDLE) ×3 IMPLANT
OBTURATOR OPTICAL STANDARD 8MM (TROCAR)
OBTURATOR OPTICAL STND 8 DVNC (TROCAR)
OBTURATOR OPTICALSTD 8 DVNC (TROCAR) IMPLANT
PACK CARDIOVASCULAR III (CUSTOM PROCEDURE TRAY) ×3 IMPLANT
PAD POSITIONING PINK XL (MISCELLANEOUS) ×3 IMPLANT
SCISSORS LAP 5X35 DISP (ENDOMECHANICALS) ×3 IMPLANT
SEAL CANN UNIV 5-8 DVNC XI (MISCELLANEOUS) ×4 IMPLANT
SEAL XI 5MM-8MM UNIVERSAL (MISCELLANEOUS) ×8
SEALER VESSEL DA VINCI XI (MISCELLANEOUS) ×2
SEALER VESSEL EXT DVNC XI (MISCELLANEOUS) ×1 IMPLANT
SOLUTION ANTI FOG 6CC (MISCELLANEOUS) ×3 IMPLANT
SOLUTION ELECTROLUBE (MISCELLANEOUS) ×3 IMPLANT
STAPLER VISISTAT 35W (STAPLE) IMPLANT
SUT ETHIBOND 0 36 GRN (SUTURE) ×6 IMPLANT
SUT MNCRL AB 4-0 PS2 18 (SUTURE) ×3 IMPLANT
SUT SILK 0 SH 30 (SUTURE) ×11 IMPLANT
SUT SILK 2 0 SH (SUTURE) ×6 IMPLANT
SYR 20CC LL (SYRINGE) ×3 IMPLANT
TOWEL OR 17X26 10 PK STRL BLUE (TOWEL DISPOSABLE) ×3 IMPLANT
TOWEL OR NON WOVEN STRL DISP B (DISPOSABLE) ×3 IMPLANT
TROCAR ADV FIXATION 5X100MM (TROCAR) ×3 IMPLANT
TUBING INSUFFLATION (TUBING) ×3 IMPLANT

## 2017-03-06 NOTE — Op Note (Signed)
03/06/2017  10:58 AM  PATIENT:  [name]  46 y.o. female  PRE-OPERATIVE DIAGNOSIS:  HIATAL HERNIA  POST-OPERATIVE DIAGNOSIS:  HIATAL HERNIA  PROCEDURE:  Procedure(s): XI ROBOTIC ASSISTED HIATAL HERNIA REPAIR WITH MESH AND NISSEN FUNDOPLICATION (N/A) INSERTION OF MESH (N/A)  SURGEON:  Surgeon(s) and Role:    * Axel Filleramirez, Harland Aguiniga, MD - Primary    Karie Soda* Gross, Steven, MD - Assisting  ANESTHESIA:   local and general  EBL:  minimal   BLOOD ADMINISTERED:none  DRAINS: none   LOCAL MEDICATIONS USED:  BUPIVICAINE   SPECIMEN:  No Specimen  DISPOSITION OF SPECIMEN:  N/A  COUNTS:  YES  TOURNIQUET:  * No tourniquets in log *  DICTATION: .Dragon Dictation  The patient was taken back to the operating room and placed in the supine position with bilateral SCDs in place. The patient was prepped and draped in the usual sterile fashion. After appropriate antibiotics were confirmed a timeout was called and all facts were verified.   A Veress needle technique was used to insufflate the abdomen to 15 mm of mercury the paramedian stab incision. Subsequent to this an 8 mm trocar was introduced as was a 8 millimeter camera. At this time the subsequent robotic trochars x3, were then placed adjacent to this trocar approximately 8-10 cm away. Each trocar was inserted under direct visualization, there were total of 4 trochars. The assistant trocar was then placed in the right lower quadrant under direct visualization. The Nathanson retractor was then visualized inserted into the abdomen and the incision just to the left of the falciform ligament. This was then placed to retract the liver appropriately. At this time the patient was positioned in reverse Trendelenburg.   At this time the robot patient cart was brought to the bedside and placed in good position and the arms were docked to the trochars appropriately. At this time I proceeded to incised the gastrohepatic ligament.  At this time I proceeded to  mobilize the stomach inferiorly and visualize the right crus. The peritoneum over the right crus was incised and right crus was identified. I proceeded to dissect this inferiorly until the left crus was seen joining the right crus. Once the right crus was adequately dissected we turned our to the left crus which was dissected away. This required traction of the stomach to the right side. Once this was visualized we then proceeded to circumferentially dissect the esophagus away from the surrounding tissue. At this time a Penrose drain was placed around the esophagus to help with retraction. At this time the phrenoesophageal fat pad was dissected away from the esophagus. There was a moderate-sized hiatal hernia seen. I mobilized the esophagus cephalad approximately 5 cm, clearing away the surrounding tissue. The anterior hernia sac was dissected away from the stomach and esophagus.  The hernia sac was transected from the stomach and removed from the abdominal cavity.  At this time I proceeded to close the hiatus using figure of 8 0 Ethibonds x 3. This brought together the hiatal closure without undue stricture to the esophagus. A piece of Gore Bio A hiatal mesh was placed over the hiatal closure and sutured to the crus using 0 silk sutures x 4.  At this time we turned our attention to the greater curvature the stomach and the omentum was mobilized using the robotic vessel sealer. This was taken up to the greater curvature to the hiatus. This mobilized the entire greater curvature to allow mobilization and the wrap. I then proceeded  to bring the greater curvature the stomach posterior to the esophagus, and a shoeshine technique was used to evaluate the mobilization of the greater curvature.   At this time the greater curvature was brought around the esophagus and sutured using 0 silk sutures interrupted fashion approximately 1 cm apart x3. The middle suture was sutured to the esophagus. A left collar stitch was  then used to gastropexy the stomach from the wrap to the diaphragm just lateral to the left crus as.  A second collar stitch was placed from the wrap to the right crus. The wrap lay at approximately 11:00 on its own with undue tension.  The wrap lay loose with no strangulation of the esophagus.  At this time the robot was undocked. The liver trocar was removed. At this time insufflation was evacuated. Skin was reapproximated for Monocryl subcuticular fashion. The skin was then dressed with Dermabond. The patient tolerated the procedure well and was taken to the recovery room in stable condition.  PLAN OF CARE: Admit to inpatient   PATIENT DISPOSITION:  PACU - hemodynamically stable.   Delay start of Pharmacological VTE agent (>24hrs) due to surgical blood loss or risk of bleeding: yes

## 2017-03-06 NOTE — Discharge Instructions (Signed)
EATING AFTER YOUR ESOPHAGEAL SURGERY °(Stomach Fundoplication, Hiatal Hernia repair, Achalasia surgery, etc) ° °###################################################################### ° °EAT °Start with a pureed / full liquid diet (see below) °Gradually transition to a high fiber diet with a fiber supplement over the next month after discharge.   ° °WALK °Walk an hour a day.  Control your pain to do that.   ° °CONTROL PAIN °Control pain so that you can walk, sleep, tolerate sneezing/coughing, go up/down stairs. ° °HAVE A BOWEL MOVEMENT DAILY °Keep your bowels regular to avoid problems.  OK to try a laxative to override constipation.  OK to use an antidairrheal to slow down diarrhea.  Call if not better after 2 tries ° °CALL IF YOU HAVE PROBLEMS/CONCERNS °Call if you are still struggling despite following these instructions. °Call if you have concerns not answered by these instructions ° °###################################################################### ° ° °After your esophageal surgery, expect some sticking with swallowing over the next 1-2 months.   ° °If food sticks when you eat, it is called "dysphagia".  This is due to swelling around your esophagus at the wrap & hiatal diaphragm repair.  It will gradually ease off over the next few months.  To help you through this temporary phase, we start you out on a pureed (blenderized) diet. ° °Your first meal in the hospital was thin liquids.  You should have been given a pureed diet by the time you left the hospital.  We ask patients to stay on a pureed diet for the first 2-3 weeks to avoid anything getting "stuck" near your recent surgery.  Don't be alarmed if your ability to swallow doesn't progress according to this plan.  Everyone is different and some diets can advance more or less quickly.   ° ° °Some BASIC RULES to follow are: °· Maintain an upright position whenever eating or drinking. °· Take small bites - just a teaspoon size bite at a time. °· Eat slowly.   It may also help to eat only one food at a time. °· Consider nibbling through smaller, more frequent meals & avoid the urge to eat BIG meals °· Do not push through feelings of fullness, nausea, or bloatedness °· Do not mix solid foods and liquids in the same mouthful °· Try not to "wash foods down" with large gulps of liquids. °· Avoid carbonated (bubbly/fizzy) drinks.   °· Avoid foods that make you feel gassy or bloated.  Start with bland foods first.  Wait on trying greasy, fried, or spicy meals until you are tolerating more bland solids well. °· Understand that it will be hard to burp and belch at first.  This gradually improves with time.  Expect to be more gassy/flatulent/bloated initially.  Walking will help your body manage it better. °· Consider using medications for bloating that contain simethicone such as  Maalox or Gas-X  °· Eat in a relaxed atmosphere & minimize distractions. °· Avoid talking while eating.   °· Do not use straws. °· Following each meal, sit in an upright position (90 degree angle) for 60 to 90 minutes.  Going for a short walk can help as well °· If food does stick, don't panic.  Try to relax and let the food pass on its own.  Sipping WARM LIQUID such as strong hot black tea can also help slide it down. ° ° °Be gradual in changes & use common sense: ° °-If you easily tolerating a certain "level" of foods, advance to the next level gradually °-If you are   having trouble swallowing a particular food, then avoid it.   °-If food is sticking when you advance your diet, go back to thinner previous diet (the lower LEVEL) for 1-2 days. ° °LEVEL 1 = PUREED DIET ° °Do for the first 2 WEEKS AFTER SURGERY ° °-Foods in this group are pureed or blenderized to a smooth, mashed potato-like consistency.  °-If necessary, the pureed foods can keep their shape with the addition of a thickening agent.   °-Meat should be pureed to a smooth, pasty consistency.  Hot broth or gravy may be added to the pureed  meat, approximately 1 oz. of liquid per 3 oz. serving of meat. °-CAUTION:  If any foods do not puree into a smooth consistency, swallowing will be more difficult.  (For example, nuts or seeds sometimes do not blend well.) ° °Hot Foods Cold Foods  °Pureed scrambled eggs and cheese Pureed cottage cheese  °Baby cereals Thickened juices and nectars  °Thinned cooked cereals (no lumps) Thickened milk or eggnog  °Pureed French toast or pancakes Ensure  °Mashed potatoes Ice cream  °Pureed parsley, au gratin, scalloped potatoes, candied sweet potatoes Fruit or Italian ice, sherbet  °Pureed buttered or alfredo noodles Plain yogurt  °Pureed vegetables (no corn or peas) Instant breakfast  °Pureed soups and creamed soups Smooth pudding, mousse, custard  °Pureed scalloped apples Whipped gelatin  °Gravies Sugar, syrup, honey, jelly  °Sauces, cheese, tomato, barbecue, white, creamed Cream  °Any baby food Creamer  °Alcohol in moderation (not beer or champagne) Margarine  °Coffee or tea Mayonnaise  ° Ketchup, mustard  ° Apple sauce  ° °SAMPLE MENU:  PUREED DIET °Breakfast Lunch Dinner  °· Orange juice, 1/2 cup °· Cream of wheat, 1/2 cup · Pineapple juice, 1/2 cup · Pureed turkey, barley soup, 3/4 cup °· Pureed Hawaiian chicken, 3 oz  °· Scrambled eggs, mashed or blended with cheese, 1/2 cup °· Tea or coffee, 1 cup  °· Whole milk, 1 cup  °· Non-dairy creamer, 2 Tbsp. · Mashed potatoes, 1/2 cup °· Pureed cooled broccoli, 1/2 cup °· Apple sauce, 1/2 cup °· Coffee or tea · Mashed potatoes, 1/2 cup °· Pureed spinach, 1/2 cup °· Frozen yogurt, 1/2 cup °· Tea or coffee  ° ° ° ° °LEVEL 2 = SOFT DIET ° °After your first 2 weeks, you can advance to a soft diet.   °Keep on this diet until everything goes down easily. ° °Hot Foods Cold Foods  °White fish Cottage cheese  °Stuffed fish Junior baby fruit  °Baby food meals Semi thickened juices  °Minced soft cooked, scrambled, poached eggs nectars  °Souffle & omelets Ripe mashed bananas  °Cooked  cereals Canned fruit, pineapple sauce, milk  °potatoes Milkshake  °Buttered or Alfredo noodles Custard  °Cooked cooled vegetable Puddings, including tapioca  °Sherbet Yogurt  °Vegetable soup or alphabet soup Fruit ice, Italian ice  °Gravies Whipped gelatin  °Sugar, syrup, honey, jelly Junior baby desserts  °Sauces:  Cheese, creamed, barbecue, tomato, white Cream  °Coffee or tea Margarine  ° °SAMPLE MENU:  LEVEL 2 °Breakfast Lunch Dinner  °· Orange juice, 1/2 cup °· Oatmeal, 1/2 cup °· Scrambled eggs with cheese, 1/2 cup °· Decaffeinated tea, 1 cup °· Whole milk, 1 cup °· Non-dairy creamer, 2 Tbsp · Pineapple juice, 1/2 cup °· Minced beef, 3 oz °· Gravy, 2 Tbsp °· Mashed potatoes, 1/2 cup °· Minced fresh broccoli, 1/2 cup °· Applesauce, 1/2 cup °· Coffee, 1 cup · Turkey, barley soup, 3/4 cup °·   Minced Hawaiian chicken, 3 oz °· Mashed potatoes, 1/2 cup °· Cooked spinach, 1/2 cup °· Frozen yogurt, 1/2 cup °· Non-dairy creamer, 2 Tbsp  ° ° ° ° °LEVEL 3 = CHOPPED DIET ° °-After all the foods in level 2 (soft diet) are passing through well you should advance up to more chopped foods.  °-It is still important to cut these foods into small pieces and eat slowly. ° °Hot Foods Cold Foods  °Poultry Cottage cheese  °Chopped Swedish meatballs Yogurt  °Meat salads (ground or flaked meat) Milk  °Flaked fish (tuna) Milkshakes  °Poached or scrambled eggs Soft, cold, dry cereal  °Souffles and omelets Fruit juices or nectars  °Cooked cereals Chopped canned fruit  °Chopped French toast or pancakes Canned fruit cocktail  °Noodles or pasta (no rice) Pudding, mousse, custard  °Cooked vegetables (no frozen peas, corn, or mixed vegetables) Green salad  °Canned small sweet peas Ice cream  °Creamed soup or vegetable soup Fruit ice, Italian ice  °Pureed vegetable soup or alphabet soup Non-dairy creamer  °Ground scalloped apples Margarine  °Gravies Mayonnaise  °Sauces:  Cheese, creamed, barbecue, tomato, white Ketchup  °Coffee or tea Mustard   ° °SAMPLE MENU:  LEVEL 3 °Breakfast Lunch Dinner  °· Orange juice, 1/2 cup °· Oatmeal, 1/2 cup °· Scrambled eggs with cheese, 1/2 cup °· Decaffeinated tea, 1 cup °· Whole milk, 1 cup °· Non-dairy creamer, 2 Tbsp °· Ketchup, 1 Tbsp °· Margarine, 1 tsp °· Salt, 1/4 tsp °· Sugar, 2 tsp · Pineapple juice, 1/2 cup °· Ground beef, 3 oz °· Gravy, 2 Tbsp °· Mashed potatoes, 1/2 cup °· Cooked spinach, 1/2 cup °· Applesauce, 1/2 cup °· Decaffeinated coffee °· Whole milk °· Non-dairy creamer, 2 Tbsp °· Margarine, 1 tsp °· Salt, 1/4 tsp · Pureed turkey, barley soup, 3/4 cup °· Barbecue chicken, 3 oz °· Mashed potatoes, 1/2 cup °· Ground fresh broccoli, 1/2 cup °· Frozen yogurt, 1/2 cup °· Decaffeinated tea, 1 cup °· Non-dairy creamer, 2 Tbsp °· Margarine, 1 tsp °· Salt, 1/4 tsp °· Sugar, 1 tsp  ° ° °LEVEL 4:  REGULAR FOODS ° °-Foods in this group are soft, moist, regularly textured foods.   °-This level includes meat and breads, which tend to be the hardest things to swallow.   °-Eat very slowly, chew well and continue to avoid carbonated drinks. °-most people are at this level in 4-6 weeks ° °Hot Foods Cold Foods  °Baked fish or skinned Soft cheeses - cottage cheese  °Souffles and omelets Cream cheese  °Eggs Yogurt  °Stuffed shells Milk  °Spaghetti with meat sauce Milkshakes  °Cooked cereal Cold dry cereals (no nuts, dried fruit, coconut)  °French toast or pancakes Crackers  °Buttered toast Fruit juices or nectars  °Noodles or pasta (no rice) Canned fruit  °Potatoes (all types) Ripe bananas  °Soft, cooked vegetables (no corn, lima, or baked beans) Peeled, ripe, fresh fruit  °Creamed soups or vegetable soup Cakes (no nuts, dried fruit, coconut)  °Canned chicken noodle soup Plain doughnuts  °Gravies Ice cream  °Bacon dressing Pudding, mousse, custard  °Sauces:  Cheese, creamed, barbecue, tomato, white Fruit ice, Italian ice, sherbet  °Decaffeinated tea or coffee Whipped gelatin  °Pork chops Regular gelatin  ° Canned fruited  gelatin molds  ° Sugar, syrup, honey, jam, jelly  ° Cream  ° Non-dairy  ° Margarine  ° Oil  ° Mayonnaise  ° Ketchup  ° Mustard  ° °TROUBLESHOOTING IRREGULAR BOWELS  °1) Avoid extremes of bowel   movements (no bad constipation/diarrhea)  °2) Miralax 17gm mixed in 8oz. water or juice-daily. May use BID as needed.  °3) Gas-x,Phazyme, etc. as needed for gas & bloating.  °4) Soft,bland diet. No spicy,greasy,fried foods.  °5) Prilosec over-the-counter as needed  °6) May hold gluten/wheat products from diet to see if symptoms improve.  °7) May try probiotics (Align, Activa, etc) to help calm the bowels down  °7) If symptoms become worse call back immediately. ° ° ° °If you have any questions please call our office at [facility]: [phone]. ° °

## 2017-03-06 NOTE — Anesthesia Postprocedure Evaluation (Signed)
Anesthesia Post Note  Patient: Naval architectCatina [name]  Procedure(s) Performed: XI ROBOTIC ASSISTED HIATAL HERNIA REPAIR WITH MESH AND NISSEN FUNDOPLICATION (N/A Abdomen) INSERTION OF MESH (N/A Abdomen)     Patient location during evaluation: PACU Anesthesia Type: General Level of consciousness: awake and alert Pain management: pain level controlled Vital Signs Assessment: post-procedure vital signs reviewed and stable Respiratory status: spontaneous breathing, nonlabored ventilation, respiratory function stable and patient connected to nasal cannula oxygen Cardiovascular status: blood pressure returned to baseline and stable Postop Assessment: no apparent nausea or vomiting Anesthetic complications: no    Last Vitals:  Vitals:   03/06/17 1315 03/06/17 1333  BP: 132/86 (!) 145/83  Pulse: 68   Resp: 13   Temp:  36.6 C  SpO2: 99%     Last Pain:  Vitals:   03/06/17 1333  TempSrc:   PainSc: 4                  Kennieth RadFitzgerald, Hari Casaus E

## 2017-03-06 NOTE — Transfer of Care (Signed)
Immediate Anesthesia Transfer of Care Note  Patient: [name]  Procedure(s) Performed: XI ROBOTIC ASSISTED HIATAL HERNIA REPAIR WITH MESH AND NISSEN FUNDOPLICATION (N/A Abdomen) INSERTION OF MESH (N/A Abdomen)  Patient Location: PACU  Anesthesia Type:General  Level of Consciousness: awake, alert  and oriented  Airway & Oxygen Therapy: Patient Spontanous Breathing and Patient connected to face mask oxygen  Post-op Assessment: Report given to RN and Post -op Vital signs reviewed and stable  Post vital signs: Reviewed and stable  Last Vitals:  Vitals:   03/06/17 0648  BP: (!) 145/100  Pulse: 82  Resp: 16  Temp: 36.8 C  SpO2: 98%    Last Pain:  Vitals:   03/06/17 0648  TempSrc: Oral      Patients Stated Pain Goal: 4 (03/06/17 [id number])  Complications: No apparent anesthesia complications

## 2017-03-06 NOTE — H&P (Signed)
History of Present Illness  The patient is a 46 year old female who presents with a hiatal hernia. Patient is a 46 year old female who is referred by Dr. Marca AnconaKarki for evaluation of a hiatal hernia. Patient states that since December 2017 she does some dysphagia she states that her epigastrium. She states that she has a tremendous amount of reflux. She is unable to lay flat. She states that several hours after eating she has reflux of her food that she ate. She states this is both with liquids and solids.  Patient had an endoscopy recently and was seen to have a large hiatal hernia. Patient had a CT scan in approximately 2010 that I reviewed myself and was found to have a hiatal hernia at that time as well. That appeared smaller at that time.  Patient has not had any manometry or radiological studies recently.  Update: Patient had her manometry which revealed no signs of achalasia Patient had a CT scan which revealed a large hiatal hernia with the majority of the stomach to the left portion of her chest. Hiatus appears to be approximate 4 cm.  Patient comes back in with continued reflux, chest pain with eating.   Diagnostic Studies History  Colonoscopy  never Mammogram  1-3 years ago Pap Smear  1-5 years ago  Allergies  No Known Allergies 12/12/2016  Medication History  BuPROPion HCl (100MG  Tablet, Oral) Active. HydroCHLOROthiazide (25MG  Tablet, Oral) Active. Meloxicam (15MG  Tablet, Oral) Active. Multivitamins (Oral) Active. Benadryl (25MG  Tablet, Oral) Active. Pantoprazole Sodium (40MG  Packet, Oral) Active. ZyrTEC (10MG  Tablet, Oral) Active. Ferrous Gluconate (324 (38 Fe)MG Tablet, Oral) Active. Vitamin B12 (1000MCG Tablet ER, Oral) Active. Medications Reconciled  Social History  Alcohol use  Remotely quit alcohol use. Caffeine use  Carbonated beverages, Coffee, Tea. No drug use  Tobacco use  Former smoker.  Family History  Arthritis   Mother. Cancer  Father. Diabetes Mellitus  Mother. Heart Disease  Mother. Heart disease in female family member before age 45  Hypertension  Brother, Father, Mother.  Pregnancy / Birth History Age at menarche  13 years. Age of menopause  <46 Contraceptive History  Oral contraceptives. Gravida  1 Length (months) of breastfeeding  3-6 Maternal age  71>40 Para  1 Regular periods   Other Problems Arthritis  Gastroesophageal Reflux Disease     Review of Systems  General Present- Fatigue and Night Sweats. Not Present- Appetite Loss, Chills, Fever, Weight Gain and Weight Loss. Skin Present- Dryness. Not Present- Change in Wart/Mole, Hives, Jaundice, New Lesions, Non-Healing Wounds, Rash and Ulcer. HEENT Not Present- Earache, Hearing Loss, Hoarseness, Nose Bleed, Oral Ulcers, Ringing in the Ears, Seasonal Allergies, Sinus Pain, Sore Throat, Visual Disturbances, Wears glasses/contact lenses and Yellow Eyes. Respiratory Not Present- Bloody sputum, Chronic Cough, Difficulty Breathing, Snoring and Wheezing. Breast Not Present- Breast Mass, Breast Pain, Nipple Discharge and Skin Changes. Cardiovascular Not Present- Chest Pain, Difficulty Breathing Lying Down, Leg Cramps, Palpitations, Rapid Heart Rate, Shortness of Breath and Swelling of Extremities. Gastrointestinal Present- Abdominal Pain, Bloating, Constipation, Difficulty Swallowing, Excessive gas, Gets full quickly at meals, Indigestion, Rectal Pain and Vomiting. Not Present- Bloody Stool, Change in Bowel Habits, Chronic diarrhea, Hemorrhoids and Nausea. Female Genitourinary Not Present- Frequency, Nocturia, Painful Urination, Pelvic Pain and Urgency. Musculoskeletal Present- Joint Stiffness, Muscle Pain and Swelling of Extremities. Not Present- Back Pain, Joint Pain and Muscle Weakness. Psychiatric Not Present- Anxiety, Bipolar, Change in Sleep Pattern, Depression, Fearful and Frequent crying. Endocrine Present- Hot  flashes. Not Present- Cold Intolerance,  Excessive Hunger, Hair Changes, Heat Intolerance and New Diabetes. Hematology Not Present- Blood Thinners, Easy Bruising, Excessive bleeding, Gland problems, HIV and Persistent Infections.  BP (!) 145/100   Pulse 82   Temp 98.2 F (36.8 C) (Oral)   Resp 16   Ht 5\' 7"  (1.702 m)   Wt 112.5 kg (248 lb)   LMP 02/12/2017 (Exact Date)   SpO2 98%   Breastfeeding? No   BMI 38.84 kg/m       Physical Exam The physical exam findings are as follows: Note: Constitutional: No acute distress, conversant, appears stated age  Eyes: Anicteric sclerae, moist conjunctiva, no lid lag  Neck: No thyromegaly, trachea midline, no cervical lymphadenopathy  Lungs: Clear to auscultation biilaterally, normal respiratory effot  Cardiovascular: regular rate & rhythm, no murmurs, no peripheal edema, pedal pulses 2+  GI: Soft, no masses or hepatosplenomegaly, non-tender to palpation  MSK: Normal gait, no clubbing cyanosis, edema  Skin: No rashes, palpation reveals normal skin turgor  Psychiatric: Appropriate judgment and insight, oriented to person, place, and time    Assessment & Plan HIATAL HERNIA WITH GERD (K21.9) Impression: [age] year old female with hiatal hernia. 1. The patient will like to proceed to the operating room for a minimally invasive hiatal hernia repair and Nissen fundoplication. 2. I discussed the risks and benefits the procedure to include but not limited to: Infection, bleeding, damage structures, possible recurrence, possible pneumothorax and possible need for drain placement. The patient was understanding and wishes to proceed.

## 2017-03-06 NOTE — Anesthesia Procedure Notes (Signed)
Procedure Name: Intubation Date/Time: 03/06/2017 8:34 AM Performed by: [place] Dalani Mette C, CRNA Pre-anesthesia Checklist: Patient identified, Emergency Drugs available, Suction available and Patient being monitored Patient Re-evaluated:Patient Re-evaluated prior to induction Oxygen Delivery Method: Circle system utilized Preoxygenation: Pre-oxygenation with 100% oxygen Induction Type: IV induction and Rapid sequence Laryngoscope Size: Mac and 3 Grade View: Grade I Tube type: Oral Number of attempts: 1 Airway Equipment and Method: Stylet and Oral airway Placement Confirmation: ETT inserted through vocal cords under direct vision,  positive ETCO2 and breath sounds checked- equal and bilateral Secured at: 21 cm Tube secured with: Tape Dental Injury: Teeth and Oropharynx as per pre-operative assessment

## 2017-03-07 ENCOUNTER — Inpatient Hospital Stay (HOSPITAL_COMMUNITY): Payer: BLUE CROSS/BLUE SHIELD

## 2017-03-07 LAB — BASIC METABOLIC PANEL
Anion gap: 8 (ref 5–15)
BUN: 9 mg/dL (ref 6–20)
CALCIUM: 8.2 mg/dL — AB (ref 8.9–10.3)
CHLORIDE: 103 mmol/L (ref 101–111)
CO2: 25 mmol/L (ref 22–32)
CREATININE: 0.66 mg/dL (ref 0.44–1.00)
GFR calc non Af Amer: >60 mL/min (ref >60)
Glucose, Bld: 128 mg/dL — ABNORMAL HIGH (ref 65–99)
Potassium: 3.4 mmol/L — ABNORMAL LOW (ref 3.5–5.1)
Sodium: 136 mmol/L (ref 135–145)

## 2017-03-07 MED ORDER — HYDROCODONE-ACETAMINOPHEN 7.5-325 MG/15ML PO SOLN
10.0000 mL | ORAL | Status: DC | PRN
  Administered 2017-03-07 – 2017-03-08 (×3): 10 mL via ORAL
  Filled 2017-03-07 (×3): qty 15

## 2017-03-07 MED ORDER — IOPAMIDOL (ISOVUE-300) INJECTION 61%
INTRAVENOUS | Status: AC
  Administered 2017-03-07: 25 mL via ORAL
  Filled 2017-03-07: qty 150

## 2017-03-07 NOTE — Plan of Care (Signed)
Nutrition Education Note  RD consulted for nutrition education regarding patient who is s/p nissen fundoplication.  RD provided handout on Nissen Fundoplication nutrition therapy. Reviewed clear and full liquids. Patient to follow liquid diet for 2 weeks. Encouraged pt to avoid caffeine, carbonated beverages and use of straws.  Explained that pt's surgeon will advance diet. Provided examples of appropriate foods on a soft diet. Reviewed gas producing foods. Discouraged intake of processed foods and red meat. Recommended use of a liquid multivitamin while following a liquid diet. Reviewed acceptable protein supplements.  RD discussed why it is important for patient to adhere to diet recommendations. Teach back method used.  Expect good compliance.   Body mass index is 38.84 kg/m.  Pt meets criteria for obesity based on current BMI.  Current diet order is NPO, awaiting results of swallow study. Labs and medications reviewed. No further nutrition interventions warranted at this time. If additional nutrition issues arise, please re-consult RD.   Renee FrancoLindsey Irwin Toran, MS, RD, LDN [facility] Clinical Dietitian Pager: [phone] After Hours Pager: [phone]

## 2017-03-07 NOTE — Progress Notes (Signed)
1 Day Post-Op   Subjective/Chief Complaint: Pt with some abdominal soreness this AM. Ambulated yesterday.   Objective: Vital signs in last 24 hours: Temp:  [97.8 F (36.6 C)-98.5 F (36.9 C)] 98.4 F (36.9 C) (12/06 0523) Pulse Rate:  [63-85] 63 (12/06 0523) Resp:  [13-26] 18 (12/06 0523) BP: (113-169)/(65-107) 117/65 (12/06 0523) SpO2:  [95 %-100 %] 96 % (12/06 0523) Last BM Date: 03/03/17  Intake/Output from previous day: 12/05 0701 - 12/06 0700 In: 2331.7 [I.V.:2331.7] Out: 855 [Urine:825; Blood:30] Intake/Output this shift: No intake/output data recorded.  General appearance: alert and cooperative GI: soft, non-tender; bowel sounds normal; no masses,  no organomegaly  Lab Results:  Recent Labs    03/05/17 1330  WBC 10.0  HGB 13.7  HCT 40.5  PLT 277   BMET Recent Labs    03/05/17 1330 03/07/17 0504  NA 136 136  K 3.5 3.4*  CL 101 103  CO2 25 25  GLUCOSE 90 128*  BUN 12 9  CREATININE 0.75 0.66  CALCIUM 9.1 8.2*   Assessment/Plan: s/p Procedure(s): XI ROBOTIC ASSISTED HIATAL HERNIA REPAIR WITH MESH AND NISSEN FUNDOPLICATION (N/A) INSERTION OF MESH (N/A) Awaiting swallow study today, and if OK will start liquids  Mobilize as tol Plan on DC tomorrow   LOS: 1 day    Marigene EhlersRamirez Jr., [name] 03/07/2017

## 2017-03-08 MED ORDER — HYDROCODONE-ACETAMINOPHEN 7.5-325 MG/15ML PO SOLN: 10.0000 mL | ORAL | 0 refills | Status: DC | PRN

## 2017-03-08 NOTE — Progress Notes (Signed)
Discharge instructions reviewed with patient. All questions answered. Patient wheeled down to vehicle with belongings by nurse tech. 

## 2017-03-08 NOTE — Discharge Summary (Signed)
Physician Discharge Summary  Patient ID: [name] MRN: [id number] DOB/AGE: 45/07/1970 45 y.o.  Admit date: 03/06/2017 Discharge date: 03/08/2017  Admission Diagnoses: Hiatal hernia Discharge Diagnoses:  Active Problems:   S/P Nissen fundoplication (without gastrostomy tube) procedure   Discharged Condition: good  Hospital Course: Postoperatively patient was sent to the floor.  She did well was ambulating on postop day 0.  On postop day 1 she underwent esophagogram which revealed no esophageal leak.  She was on a liquid diet.  She had dietitian education.  Patient had good pain control.  She was ambulating well on her own.  She was deemed stable for discharge and discharged home.  Consults: None  Significant Diagnostic Studies: Esophagram which revealed no esophageal leak.  Treatments: surgery: As above  Discharge Exam: Blood pressure 123/71, pulse 75, temperature 97.9 F (36.6 C), temperature source Oral, resp. rate 16, height 5\' 7"  (1.702 m), weight 112.5 kg (248 lb), last menstrual period 02/12/2017, SpO2 98 %, not currently breastfeeding. General appearance: alert and cooperative GI: soft, non-tender; bowel sounds normal; no masses,  no organomegaly  Disposition: 01-Home or Self Care  Discharge Instructions    Diet - low sodium heart healthy   Complete by:  As directed    Increase activity slowly   Complete by:  As directed      Allergies as of 03/08/2017   No Known Allergies     Medication List    TAKE these medications   acetaminophen 500 MG tablet Commonly known as:  TYLENOL Take 1,000 mg by mouth 2 (two) times daily as needed for mild pain or headache.   ALAYCEN 1/35 tablet Generic drug:  norethindrone-ethinyl estradiol 1/35 Take 1 tablet by mouth daily.   buPROPion 100 MG tablet Commonly known as:  WELLBUTRIN Take 100 mg by mouth daily.   cetirizine 10 MG tablet Commonly known as:  ZYRTEC Take 10 mg by mouth 2 (two) times daily.   diphenhydrAMINE 25  MG tablet Commonly known as:  BENADRYL Take 25 mg by mouth daily as needed for allergies.   docusate sodium 100 MG capsule Commonly known as:  COLACE Take 100 mg by mouth daily.   ferrous gluconate 324 MG tablet Commonly known as:  FERGON Take 324 mg by mouth daily.   hydrochlorothiazide 25 MG tablet Commonly known as:  HYDRODIURIL Take 1 tablet (25 mg total) by mouth daily.   HYDROcodone-acetaminophen 7.5-325 mg/15 ml solution Commonly known as:  HYCET Take 10 mLs by mouth every 4 (four) hours as needed for moderate pain.   meloxicam 15 MG tablet Commonly known as:  MOBIC Take 15 mg by mouth daily.   pantoprazole 40 MG tablet Commonly known as:  PROTONIX Take 1 tablet (40 mg total) by mouth daily.   vitamin B-12 1000 MCG tablet Commonly known as:  CYANOCOBALAMIN Take 1,000 mcg by mouth daily.      Follow-up Information    Axel Filleramirez, Martrell Eguia, MD. Schedule an appointment as soon as possible for a visit in 2 week(s).   Specialty:  General Surgery Why:  For wound re-check Contact information: [address] [place] [address] [phone]           Signed: Marigene EhlersRamirez Jr., [name] 03/08/2017, 12:33 PM

## 2018-11-21 ENCOUNTER — Other Ambulatory Visit: Payer: Self-pay | Admitting: *Deleted

## 2018-11-21 DIAGNOSIS — Z20822 Contact with and (suspected) exposure to covid-19: Secondary | ICD-10-CM

## 2018-11-22 LAB — NOVEL CORONAVIRUS, NAA: SARS-CoV-2, NAA: NOT DETECTED

## 2018-12-25 ENCOUNTER — Other Ambulatory Visit: Payer: Self-pay | Admitting: Orthopedic Surgery

## 2018-12-25 DIAGNOSIS — M5416 Radiculopathy, lumbar region: Secondary | ICD-10-CM

## 2018-12-25 DIAGNOSIS — M545 Low back pain, unspecified: Secondary | ICD-10-CM

## 2019-01-14 ENCOUNTER — Ambulatory Visit
Admission: RE | Admit: 2019-01-14 | Discharge: 2019-01-14 | Disposition: A | Discharge status: Home / Self Care | Payer: BC Managed Care – PPO | Source: Ambulatory Visit | Attending: Orthopedic Surgery | Admitting: Orthopedic Surgery

## 2019-01-14 DIAGNOSIS — M545 Low back pain, unspecified: Secondary | ICD-10-CM

## 2019-01-14 DIAGNOSIS — M5416 Radiculopathy, lumbar region: Secondary | ICD-10-CM

## 2019-01-20 ENCOUNTER — Other Ambulatory Visit: Payer: Self-pay | Admitting: Orthopedic Surgery

## 2019-01-20 DIAGNOSIS — M545 Low back pain, unspecified: Secondary | ICD-10-CM

## 2019-01-20 DIAGNOSIS — G8929 Other chronic pain: Secondary | ICD-10-CM

## 2019-01-20 DIAGNOSIS — M5416 Radiculopathy, lumbar region: Secondary | ICD-10-CM

## 2019-01-26 ENCOUNTER — Other Ambulatory Visit: Payer: Self-pay

## 2019-01-26 ENCOUNTER — Ambulatory Visit
Admission: RE | Admit: 2019-01-26 | Discharge: 2019-01-26 | Disposition: A | Discharge status: Home / Self Care | Payer: BC Managed Care – PPO | Source: Ambulatory Visit | Attending: Orthopedic Surgery | Admitting: Orthopedic Surgery

## 2019-01-26 ENCOUNTER — Other Ambulatory Visit: Payer: BC Managed Care – PPO

## 2019-01-26 VITALS — BP 160/95 | HR 68

## 2019-01-26 DIAGNOSIS — M5416 Radiculopathy, lumbar region: Secondary | ICD-10-CM

## 2019-01-26 DIAGNOSIS — G8929 Other chronic pain: Secondary | ICD-10-CM

## 2019-01-26 DIAGNOSIS — M545 Low back pain: Secondary | ICD-10-CM

## 2019-01-26 MED ORDER — DEXTROSE 5 % IV SOLN
3.0000 g | INTRAVENOUS | Status: AC
  Administered 2019-01-26: 3 g via INTRAVENOUS

## 2019-01-26 MED ORDER — IOPAMIDOL (ISOVUE-M 200) INJECTION 41%: 1.0000 mL | Freq: Once | INTRAMUSCULAR | Status: DC

## 2019-01-26 MED ORDER — SODIUM CHLORIDE 0.9 % IV SOLN
Freq: Once | INTRAVENOUS | Status: AC
  Administered 2019-01-26: 09:00:00 via INTRAVENOUS

## 2019-01-26 NOTE — Discharge Instructions (Signed)

## 2019-01-31 LAB — ANAEROBIC AND AEROBIC CULTURE
AER RESULT:: NO GROWTH
MICRO NUMBER:: 1029158
MICRO NUMBER:: 1029159
SPECIMEN QUALITY:: ADEQUATE
SPECIMEN QUALITY:: ADEQUATE

## 2019-03-06 ENCOUNTER — Other Ambulatory Visit: Payer: Self-pay | Admitting: Orthopedic Surgery

## 2019-03-06 ENCOUNTER — Other Ambulatory Visit: Payer: Self-pay | Admitting: *Deleted

## 2019-03-18 ENCOUNTER — Encounter: Payer: Self-pay | Admitting: Vascular Surgery

## 2019-04-13 ENCOUNTER — Telehealth: Payer: Self-pay

## 2019-04-13 NOTE — Progress Notes (Signed)
Pt's MRI is in [place], per Lupita Leash at Dr. Marshell Levan office.

## 2019-04-13 NOTE — Telephone Encounter (Signed)
Pt's MRI is in [place], per Donna at Dr. Dumonski's office.  

## 2019-04-14 ENCOUNTER — Telehealth (HOSPITAL_COMMUNITY): Payer: Self-pay

## 2019-04-14 ENCOUNTER — Encounter: Payer: BC Managed Care – PPO | Admitting: Vascular Surgery

## 2019-04-14 NOTE — Telephone Encounter (Signed)

## 2019-04-15 ENCOUNTER — Encounter: Payer: Self-pay | Admitting: Vascular Surgery

## 2019-04-15 ENCOUNTER — Ambulatory Visit (INDEPENDENT_AMBULATORY_CARE_PROVIDER_SITE_OTHER): Payer: BC Managed Care – PPO | Admitting: Vascular Surgery

## 2019-04-15 ENCOUNTER — Other Ambulatory Visit: Payer: Self-pay

## 2019-04-15 VITALS — BP 178/105 | HR 95 | Temp 98.2°F | Resp 20 | Ht 67.0 in | Wt 306.5 lb

## 2019-04-15 DIAGNOSIS — M5136 Other intervertebral disc degeneration, lumbar region: Secondary | ICD-10-CM | POA: Diagnosis not present

## 2019-04-15 NOTE — Progress Notes (Addendum)
REASON FOR CONSULT:    To evaluate for anterior retroperitoneal exposure of L4-L5.  The consult is requested by Dr. Yevette Edwards.  ASSESSMENT & PLAN:   DEGENERATIVE DISC DISEASE L4-L5: I have been asked to provide anterior retroperitoneal exposure of L4-L5.  The patient clearly is at much higher risk for complications given her obesity and given that this is at the L4-L5 level.  I have explained that it is this level in which we have to significantly push the venous structures and arterial structures to the right which is associated with the most risk.  This risk is complicated by her obesity.   Her BMI is 48.  On the lateral plain film the distance from the skin to the L4-L5 disc space was 19 cm.  Thus it may be difficult technically to even get to this level.  Her obesity will also put her at increased risk for wound healing problems and infection.  I have explained that the risks from my standpoint are primarily injury to the arteries or veins which could result in life-threatening bleeding.  In addition there is increased risk for thrombosis of the arteries and veins.  She feels that her symptoms are quite disabling and would like to proceed with surgery despite this increased risk.  All of her questions were answered.  Her surgery is scheduled for 04/27/2019.  I have tried to contact tach Dr. Yevette Edwards who was in surgery today and have left my number so that hopefully we can speak before surgery.   Waverly Ferrari, MD Office: [phone]   HPI:   [name] is a pleasant 49 y.o. female, with a long history of low back pain.  Her pain began in 2018 and developed gradually.  Her pain has progressively gotten worse.  It limits her to her activity and limits her ability to exercise.  She is failed conservative treatment including pain medication, physical therapy, and injections.  She has had no previous back surgery.  She has had previous abdominal surgery including a hiatal hernia repair and a  previous C-section.  She denies any previous history of DVT.  I do not get any clear-cut history of claudication, rest pain, or nonhealing ulcers.  Past Medical History:  Diagnosis Date  . Anemia   . Arthritis   . Depression    due to perimenopausal phase per pt.  Marland Kitchen GERD (gastroesophageal reflux disease)   . History of hiatal hernia   . Postpartum care following cesarean delivery (4/27) 07/28/2015  . Pregnancy induced hypertension    during pregnancy  . Sleep apnea    no machine  . Varicose veins   . Vitamin D deficiency     Family History  Problem Relation Age of Onset  . Heart disease Mother   . Diabetes Mother   . Hypertension Mother   . Cancer Father   . Hypertension Father   . Asthma Sister   . Seizures Sister   . Bipolar disorder Sister     SOCIAL HISTORY: Social History   Socioeconomic History  . Marital status: Married    Spouse name: Not on file  . Number of children: Not on file  . Years of education: Not on file  . Highest education level: Not on file  Occupational History  . Not on file  Tobacco Use  . Smoking status: Former Smoker    Quit date: 03/06/2011    Years since quitting: 8.1  . Smokeless tobacco: Never Used  Substance and Sexual Activity  .  Alcohol use: No    Alcohol/week: 0.0 standard drinks  . Drug use: No  . Sexual activity: Yes    Birth control/protection: Pill  Other Topics Concern  . Not on file  Social History Narrative   ** Merged History Encounter **       Social Determinants of Health   Financial Resource Strain:   . Difficulty of Paying Living Expenses: Not on file  Food Insecurity:   . Worried About Programme researcher, broadcasting/film/video in the Last Year: Not on file  . Ran Out of Food in the Last Year: Not on file  Transportation Needs:   . Lack of Transportation (Medical): Not on file  . Lack of Transportation (Non-Medical): Not on file  Physical Activity:   . Days of Exercise per Week: Not on file  . Minutes of Exercise per  Session: Not on file  Stress:   . Feeling of Stress : Not on file  Social Connections:   . Frequency of Communication with Friends and Family: Not on file  . Frequency of Social Gatherings with Friends and Family: Not on file  . Attends Religious Services: Not on file  . Active Member of Clubs or Organizations: Not on file  . Attends Banker Meetings: Not on file  . Marital Status: Not on file  Intimate Partner Violence:   . Fear of Current or Ex-Partner: Not on file  . Emotionally Abused: Not on file  . Physically Abused: Not on file  . Sexually Abused: Not on file    Allergies  Allergen Reactions  . Ibuprofen Anaphylaxis  . Tramadol Nausea And Vomiting    Current Outpatient Medications  Medication Sig Dispense Refill  . cyclobenzaprine (FLEXERIL) 5 MG tablet Take 5-10 mg by mouth at bedtime.    . gabapentin (NEURONTIN) 600 MG tablet     . HYDROcodone-acetaminophen (NORCO/VICODIN) 5-325 MG tablet     . meloxicam (MOBIC) 15 MG tablet Take 15 mg by mouth daily.    . norethindrone-ethinyl estradiol 1/35 (ALAYCEN 1/35) tablet Take 1 tablet by mouth daily.    . pantoprazole (PROTONIX) 40 MG tablet Take 1 tablet (40 mg total) by mouth daily. 30 tablet 4  . spironolactone (ALDACTONE) 25 MG tablet Take 25 mg by mouth daily.     No current facility-administered medications for this visit.    REVIEW OF SYSTEMS:  [X]  denotes positive finding, [ ]  denotes negative finding Cardiac  Comments:  Chest pain or chest pressure:    Shortness of breath upon exertion:    Short of breath when lying flat:    Irregular heart rhythm:        Vascular    Pain in calf, thigh, or hip brought on by ambulation:    Pain in feet at night that wakes you up from your sleep:     Blood clot in your veins:    Leg swelling:  x       Pulmonary    Oxygen at home:    Productive cough:     Wheezing:         Neurologic    Sudden weakness in arms or legs:     Sudden numbness in arms or legs:      Sudden onset of difficulty speaking or slurred speech:    Temporary loss of vision in one eye:     Problems with dizziness:         Gastrointestinal    Blood in stool:  Vomited blood:         Genitourinary    Burning when urinating:     Blood in urine:        Psychiatric    Major depression:         Hematologic    Bleeding problems:    Problems with blood clotting too easily:        Skin    Rashes or ulcers:        Constitutional    Fever or chills:     PHYSICAL EXAM:   Vitals:   04/15/19 1317  BP: (!) 178/105  Pulse: 95  Resp: 20  Temp: 98.2 F (36.8 C)  SpO2: 98%  Weight: (!) 306 lb 8 oz (139 kg)  Height: 5\' 7"  (1.702 m)   Body mass index is 48 kg/m.  GENERAL: The patient is a well-nourished female, in no acute distress. The vital signs are documented above. CARDIAC: There is a regular rate and rhythm.  VASCULAR: I do not detect carotid bruits. She has palpable dorsalis pedis pulses bilaterally. She has bilateral lower extremity swelling. PULMONARY: There is good air exchange bilaterally without wheezing or rales. ABDOMEN: Soft and non-tender with normal pitched bowel sounds.  MUSCULOSKELETAL: There are no major deformities or cyanosis. NEUROLOGIC: No focal weakness or paresthesias are detected. SKIN: There are no ulcers or rashes noted. PSYCHIATRIC: The patient has a normal affect.  DATA:    CT LUMBAR SPINE: I have reviewed the CT of the lumbar spine that was done on 01/14/2019.  This shows significant degenerative disc disease at L4-L5.  I do not see any complicating features from a vascular standpoint except for her obesity which clearly increases the risk of surgery.

## 2019-04-22 NOTE — Progress Notes (Signed)
[id number] - [place], [place] - [address] AT [facility] OF [address] [facility] [address] [place] [address] Phone: [phone] Fax: [phone]      Your procedure is scheduled on Monday Apr 27, 2019.  Report to [facility] Main Entrance "A" at 0530 A.M., and check in at the Admitting office.  Call this number if you have problems the morning of surgery:  [phone]  Call [phone] if you have any questions prior to your surgery date Monday-Friday 8am-4pm    Remember:  Do not eat or drink after midnight the night before your surgery  You may drink clear liquids until 0430 the morning of your surgery.   Clear liquids allowed are: Water, Non-Citrus Juices (without pulp), Carbonated Beverages, Clear Tea, Black Coffee Only, and Gatorade    Take these medicines the morning of surgery with A SIP OF WATER:  gabapentin (NEURONTIN) 600 MG tablet  HYDROcodone-acetaminophen (NORCO/VICODIN) 5-325 MG tablet - as needed  norethindrone-ethinyl estradiol 1/35 (ALAYCEN 1/35) tablet  pantoprazole (PROTONIX) 40 MG tablet    Enhanced Recovery after Surgery for Orthopedics Enhanced Recovery after Surgery is a protocol used to improve the stress on your body and your recovery after surgery.  Patient Instructions  . The night before surgery:  o No food after midnight. ONLY clear liquids after midnight  .  Marland Kitchen The day of surgery (if you do NOT have diabetes):  o Drink ONE (1) Pre-Surgery Clear Ensure as directed.   o This drink was given to you during your hospital  pre-op appointment visit. o The pre-op nurse will instruct you on the time to drink the  Pre-Surgery Ensure depending on your surgery time. o Finish the drink by 0430 morning of your surgery o Nothing else to drink after completing the  Pre-Surgery Clear Ensure.         If you have questions, please contact your surgeon's office.   Starting today, STOP taking any  Meloxicam (MOBIC), Aspirin (unless otherwise instructed by your surgeon), Aleve, Naproxen, Ibuprofen, Motrin, Advil, Goody's, BC's, all herbal medications, fish oil, and all vitamins.    The Morning of Surgery  Do not wear jewelry, make-up or nail polish.  Do not wear lotions, powders, perfumes, or deodorant  Do not shave 48 hours prior to surgery.   Do not bring valuables to the hospital.   [facility] is not responsible for any belongings or valuables.  If you are a smoker, DO NOT Smoke 24 hours prior to surgery  If you wear a CPAP at night please bring your mask the morning of surgery   Remember that you must have someone to transport you home after your surgery, and remain with you for 24 hours if you are discharged the same day.   Please bring cases for contacts, glasses, hearing aids, dentures or bridgework because it cannot be worn into surgery.    Leave your suitcase in the car.  After surgery it may be brought to your room.  For patients admitted to the hospital, discharge time will be determined by your treatment team.  Patients discharged the day of surgery will not be allowed to drive home.    Special instructions:   [place]- Preparing For Surgery  Before surgery, you can play an important role. Because skin is not sterile, your skin needs to be as free of germs as possible. You can reduce the number of germs on your skin by washing with  CHG (chlorahexidine gluconate) Soap before surgery.  CHG is an antiseptic cleaner which kills germs and bonds with the skin to continue killing germs even after washing.    Oral Hygiene is also important to reduce your risk of infection.  Remember - BRUSH YOUR TEETH THE MORNING OF SURGERY WITH YOUR REGULAR TOOTHPASTE  Please do not use if you have an allergy to CHG or antibacterial soaps. If your skin becomes reddened/irritated stop using the CHG.  Do not shave (including legs and underarms) for at least 48 hours prior to first  CHG shower. It is OK to shave your face.  Please follow these instructions carefully.   1. Shower the NIGHT BEFORE SURGERY and the MORNING OF SURGERY with CHG Soap.   2. If you chose to wash your hair, wash your hair first as usual with your normal shampoo.  3. After you shampoo, rinse your hair and body thoroughly to remove the shampoo.  4. Use CHG as you would any other liquid soap. You can apply CHG directly to the skin and wash gently with a scrungie or a clean washcloth.   5. Apply the CHG Soap to your body ONLY FROM THE NECK DOWN.  Do not use on open wounds or open sores. Avoid contact with your eyes, ears, mouth and genitals (private parts). Wash Face and genitals (private parts)  with your normal soap.   6. Wash thoroughly, paying special attention to the area where your surgery will be performed.  7. Thoroughly rinse your body with warm water from the neck down.  8. DO NOT shower/wash with your normal soap after using and rinsing off the CHG Soap.  9. Pat yourself dry with a CLEAN TOWEL.  10. Wear CLEAN PAJAMAS to bed the night before surgery, wear comfortable clothes the morning of surgery  11. Place CLEAN SHEETS on your bed the night of your first shower and DO NOT SLEEP WITH PETS.    Day of Surgery:  Please shower the morning of surgery with the CHG soap Do not apply any deodorants/lotions. Please wear clean clothes to the hospital/surgery center.   Remember to brush your teeth WITH YOUR REGULAR TOOTHPASTE.   Please read over the following fact sheets that you were given.

## 2019-04-23 ENCOUNTER — Encounter (HOSPITAL_COMMUNITY): Payer: Self-pay

## 2019-04-23 ENCOUNTER — Other Ambulatory Visit: Payer: Self-pay

## 2019-04-23 ENCOUNTER — Encounter (HOSPITAL_COMMUNITY)
Admission: RE | Admit: 2019-04-23 | Discharge: 2019-04-23 | Disposition: A | Discharge status: Home / Self Care | Payer: BC Managed Care – PPO | Source: Ambulatory Visit | Attending: Orthopedic Surgery | Admitting: Orthopedic Surgery

## 2019-04-23 ENCOUNTER — Other Ambulatory Visit (HOSPITAL_COMMUNITY)
Admission: RE | Admit: 2019-04-23 | Discharge: 2019-04-23 | Disposition: A | Discharge status: Home / Self Care | Payer: BC Managed Care – PPO | Source: Ambulatory Visit | Attending: Orthopedic Surgery | Admitting: Orthopedic Surgery

## 2019-04-23 DIAGNOSIS — I10 Essential (primary) hypertension: Secondary | ICD-10-CM | POA: Insufficient documentation

## 2019-04-23 DIAGNOSIS — Z20822 Contact with and (suspected) exposure to covid-19: Secondary | ICD-10-CM | POA: Insufficient documentation

## 2019-04-23 DIAGNOSIS — Z01818 Encounter for other preprocedural examination: Secondary | ICD-10-CM | POA: Insufficient documentation

## 2019-04-23 LAB — CBC WITH DIFFERENTIAL/PLATELET
Abs Immature Granulocytes: 0.08 10*3/uL — ABNORMAL HIGH (ref 0.00–0.07)
Basophils Absolute: 0.1 10*3/uL (ref 0.0–0.1)
Basophils Relative: 1 %
Eosinophils Absolute: 0.1 10*3/uL (ref 0.0–0.5)
Eosinophils Relative: 1 %
HCT: 42.5 % (ref 36.0–46.0)
Hemoglobin: 14 g/dL (ref 12.0–15.0)
Immature Granulocytes: 1 %
Lymphocytes Relative: 20 %
Lymphs Abs: 2.1 10*3/uL (ref 0.7–4.0)
MCH: 30.9 pg (ref 26.0–34.0)
MCHC: 32.9 g/dL (ref 30.0–36.0)
MCV: 93.8 fL (ref 80.0–100.0)
Monocytes Absolute: 0.4 10*3/uL (ref 0.1–1.0)
Monocytes Relative: 4 %
Neutro Abs: 7.8 10*3/uL — ABNORMAL HIGH (ref 1.7–7.7)
Neutrophils Relative %: 73 %
Platelets: 290 10*3/uL (ref 150–400)
RBC: 4.53 MIL/uL (ref 3.87–5.11)
RDW: 12.6 % (ref 11.5–15.5)
WBC: 10.6 10*3/uL — ABNORMAL HIGH (ref 4.0–10.5)
nRBC: 0 % (ref 0.0–0.2)

## 2019-04-23 LAB — COMPREHENSIVE METABOLIC PANEL
ALT: 17 U/L (ref 0–44)
AST: 18 U/L (ref 15–41)
Albumin: 3.5 g/dL (ref 3.5–5.0)
Alkaline Phosphatase: 70 U/L (ref 38–126)
Anion gap: 10 (ref 5–15)
BUN: 10 mg/dL (ref 6–20)
CO2: 23 mmol/L (ref 22–32)
Calcium: 9 mg/dL (ref 8.9–10.3)
Chloride: 104 mmol/L (ref 98–111)
Creatinine, Ser: 0.58 mg/dL (ref 0.44–1.00)
GFR calc Af Amer: >60 mL/min (ref >60)
GFR calc non Af Amer: >60 mL/min (ref >60)
Glucose, Bld: 110 mg/dL — ABNORMAL HIGH (ref 70–99)
Potassium: 3.7 mmol/L (ref 3.5–5.1)
Sodium: 137 mmol/L (ref 135–145)
Total Bilirubin: 0.3 mg/dL (ref 0.3–1.2)
Total Protein: 6.8 g/dL (ref 6.5–8.1)

## 2019-04-23 LAB — ABO/RH: ABO/RH(D): A POS

## 2019-04-23 LAB — SURGICAL PCR SCREEN
MRSA, PCR: NEGATIVE
Staphylococcus aureus: NEGATIVE

## 2019-04-23 LAB — TYPE AND SCREEN
ABO/RH(D): A POS
Antibody Screen: NEGATIVE

## 2019-04-23 LAB — URINALYSIS, ROUTINE W REFLEX MICROSCOPIC
Bacteria, UA: NONE SEEN
Bilirubin Urine: NEGATIVE
Glucose, UA: NEGATIVE mg/dL
Hgb urine dipstick: NEGATIVE
Ketones, ur: NEGATIVE mg/dL
Leukocytes,Ua: NEGATIVE
Nitrite: NEGATIVE
Protein, ur: 30 mg/dL — AB
Specific Gravity, Urine: 1.024 (ref 1.005–1.030)
pH: 7 (ref 5.0–8.0)

## 2019-04-23 LAB — PROTIME-INR
INR: 1 (ref 0.8–1.2)
Prothrombin Time: 13.1 seconds (ref 11.4–15.2)

## 2019-04-23 LAB — SARS CORONAVIRUS 2 (TAT 6-24 HRS): SARS Coronavirus 2: NEGATIVE

## 2019-04-23 LAB — APTT: aPTT: 27 seconds (ref 24–36)

## 2019-04-23 NOTE — Progress Notes (Signed)
PCP - Dr. Shirlean Mylar  Cardiologist - denies  PPM/ICD - N/A Device Orders -N/A  Rep Notified - N/A  Chest x-ray - N/A EKG - 04/23/19 Stress Test - denies ECHO - denies Cardiac Cath - denies  Sleep Study - OSA+, pt denies using a CPAP  Blood Thinner Instructions:N/A Aspirin Instructions:N/A  ERAS Protcol -Yes PRE-SURGERY Ensure or G2- pt provided with 1 Ensure  COVID TEST- Scheduled for today, 04/23/19. Pt advised of quarantine after testing and verbalizes understanding.    Anesthesia review: No  Patient denies shortness of breath, fever, cough and chest pain at PAT appointment   All instructions explained to the patient, with a verbal understanding of the material. Patient agrees to go over the instructions while at home for a better understanding. Patient also instructed to self quarantine after being tested for COVID-19. The opportunity to ask questions was provided.    Coronavirus Screening  Have you experienced the following symptoms:  Cough yes/no: No Fever (>100.49F)  yes/no: No Runny nose yes/no: No Sore throat yes/no: No Difficulty breathing/shortness of breath  yes/no: No  Have you or a family member traveled in the last 14 days and where? yes/no: No   If the patient indicates "YES" to the above questions, their PAT will be rescheduled to limit the exposure to others and, the surgeon will be notified. THE PATIENT WILL NEED TO BE ASYMPTOMATIC FOR 14 DAYS.   If the patient is not experiencing any of these symptoms, the PAT nurse will instruct them to NOT bring anyone with them to their appointment since they may have these symptoms or traveled as well.   Please remind your patients and families that hospital visitation restrictions are in effect and the importance of the restrictions.

## 2019-04-24 MED ORDER — DEXTROSE 5 % IV SOLN
3.0000 g | INTRAVENOUS | Status: AC
  Administered 2019-04-27: 3 g via INTRAVENOUS
  Filled 2019-04-24: qty 3
  Filled 2019-04-24: qty 3000

## 2019-04-26 NOTE — Anesthesia Preprocedure Evaluation (Addendum)
Anesthesia Evaluation  Patient identified by MRN, date of birth, ID band Patient awake    Reviewed: Allergy & Precautions, NPO status , Patient's Chart, lab work & pertinent test results  History of Anesthesia Complications Negative for: history of anesthetic complications  Airway Mallampati: II  TM Distance: >3 FB Neck ROM: Full    Dental  (+) Teeth Intact, Dental Advisory Given   Pulmonary sleep apnea , former smoker,    Pulmonary exam normal        Cardiovascular hypertension, Pt. on medications Normal cardiovascular exam     Neuro/Psych PSYCHIATRIC DISORDERS Depression    GI/Hepatic Neg liver ROS, hiatal hernia, GERD  ,  Endo/Other  Morbid obesity  Renal/GU negative Renal ROS     Musculoskeletal   Abdominal   Peds  Hematology negative hematology ROS (+)   Anesthesia Other Findings   Reproductive/Obstetrics                            Lab Results  Component Value Date   WBC 10.6 (H) 04/23/2019   HGB 14.0 04/23/2019   HCT 42.5 04/23/2019   MCV 93.8 04/23/2019   PLT 290 04/23/2019   Lab Results  Component Value Date   CREATININE 0.58 04/23/2019   BUN 10 04/23/2019   NA 137 04/23/2019   K 3.7 04/23/2019   CL 104 04/23/2019   CO2 23 04/23/2019    Anesthesia Physical  Anesthesia Plan  ASA: III  Anesthesia Plan: General   Post-op Pain Management:    Induction: Intravenous  PONV Risk Score and Plan: 4 or greater and Midazolam, Scopolamine patch - Pre-op, Dexamethasone, Ondansetron and Treatment may vary due to age or medical condition  Airway Management Planned: Oral ETT  Additional Equipment:   Intra-op Plan:   Post-operative Plan: Extubation in OR  Informed Consent: I have reviewed the patients History and Physical, chart, labs and discussed the procedure including the risks, benefits and alternatives for the proposed anesthesia with the patient or authorized  representative who has indicated his/her understanding and acceptance.     Dental advisory given  Plan Discussed with: Anesthesiologist and CRNA  Anesthesia Plan Comments:        Anesthesia Quick Evaluation

## 2019-04-27 ENCOUNTER — Inpatient Hospital Stay (HOSPITAL_COMMUNITY): Payer: BC Managed Care – PPO | Admitting: Anesthesiology

## 2019-04-27 ENCOUNTER — Inpatient Hospital Stay (HOSPITAL_COMMUNITY)
Admission: RE | Admit: 2019-04-27 | Discharge: 2019-04-29 | DRG: 454 | Disposition: A | Discharge status: Home / Self Care | Payer: BC Managed Care – PPO | Attending: Orthopedic Surgery | Admitting: Orthopedic Surgery

## 2019-04-27 ENCOUNTER — Inpatient Hospital Stay (HOSPITAL_COMMUNITY): Payer: BC Managed Care – PPO

## 2019-04-27 ENCOUNTER — Inpatient Hospital Stay (HOSPITAL_COMMUNITY)
Admission: RE | Disposition: A | Discharge status: Home / Self Care | Payer: Self-pay | Source: Home / Self Care | Attending: Orthopedic Surgery

## 2019-04-27 ENCOUNTER — Other Ambulatory Visit: Payer: Self-pay

## 2019-04-27 ENCOUNTER — Encounter (HOSPITAL_COMMUNITY): Payer: Self-pay | Admitting: Orthopedic Surgery

## 2019-04-27 DIAGNOSIS — Z888 Allergy status to other drugs, medicaments and biological substances status: Secondary | ICD-10-CM

## 2019-04-27 DIAGNOSIS — M5116 Intervertebral disc disorders with radiculopathy, lumbar region: Secondary | ICD-10-CM | POA: Diagnosis present

## 2019-04-27 DIAGNOSIS — G4730 Sleep apnea, unspecified: Secondary | ICD-10-CM | POA: Diagnosis present

## 2019-04-27 DIAGNOSIS — M5410 Radiculopathy, site unspecified: Secondary | ICD-10-CM | POA: Diagnosis present

## 2019-04-27 DIAGNOSIS — Z885 Allergy status to narcotic agent status: Secondary | ICD-10-CM | POA: Diagnosis not present

## 2019-04-27 DIAGNOSIS — K219 Gastro-esophageal reflux disease without esophagitis: Secondary | ICD-10-CM | POA: Diagnosis present

## 2019-04-27 DIAGNOSIS — Z79899 Other long term (current) drug therapy: Secondary | ICD-10-CM

## 2019-04-27 DIAGNOSIS — Z791 Long term (current) use of non-steroidal anti-inflammatories (NSAID): Secondary | ICD-10-CM

## 2019-04-27 DIAGNOSIS — M4316 Spondylolisthesis, lumbar region: Secondary | ICD-10-CM | POA: Diagnosis present

## 2019-04-27 DIAGNOSIS — Z87892 Personal history of anaphylaxis: Secondary | ICD-10-CM | POA: Diagnosis not present

## 2019-04-27 DIAGNOSIS — M1990 Unspecified osteoarthritis, unspecified site: Secondary | ICD-10-CM | POA: Diagnosis present

## 2019-04-27 DIAGNOSIS — Z419 Encounter for procedure for purposes other than remedying health state, unspecified: Secondary | ICD-10-CM

## 2019-04-27 DIAGNOSIS — M5416 Radiculopathy, lumbar region: Secondary | ICD-10-CM | POA: Diagnosis not present

## 2019-04-27 DIAGNOSIS — Z793 Long term (current) use of hormonal contraceptives: Secondary | ICD-10-CM

## 2019-04-27 DIAGNOSIS — Z87891 Personal history of nicotine dependence: Secondary | ICD-10-CM

## 2019-04-27 DIAGNOSIS — Z6841 Body Mass Index (BMI) 40.0 and over, adult: Secondary | ICD-10-CM | POA: Diagnosis not present

## 2019-04-27 DIAGNOSIS — M48061 Spinal stenosis, lumbar region without neurogenic claudication: Secondary | ICD-10-CM | POA: Diagnosis present

## 2019-04-27 HISTORY — PX: ANTERIOR LUMBAR FUSION: SHX1170

## 2019-04-27 HISTORY — PX: ABDOMINAL EXPOSURE: SHX5708

## 2019-04-27 LAB — POCT PREGNANCY, URINE: Preg Test, Ur: NEGATIVE

## 2019-04-27 SURGERY — ANTERIOR LUMBAR FUSION 1 LEVEL
Anesthesia: General | Site: Spine Lumbar

## 2019-04-27 MED ORDER — ALBUMIN HUMAN 5 % IV SOLN: INTRAVENOUS | Status: DC | PRN

## 2019-04-27 MED ORDER — ACETAMINOPHEN 650 MG RE SUPP: 650.0000 mg | RECTAL | Status: DC | PRN

## 2019-04-27 MED ORDER — 0.9 % SODIUM CHLORIDE (POUR BTL) OPTIME
TOPICAL | Status: DC | PRN
  Administered 2019-04-27 (×2): 1000 mL

## 2019-04-27 MED ORDER — MORPHINE SULFATE (PF) 2 MG/ML IV SOLN
1.0000 mg | INTRAVENOUS | Status: DC | PRN
  Administered 2019-04-27 – 2019-04-28 (×4): 2 mg via INTRAVENOUS
  Filled 2019-04-27 (×4): qty 1

## 2019-04-27 MED ORDER — ROCURONIUM BROMIDE 10 MG/ML (PF) SYRINGE
PREFILLED_SYRINGE | INTRAVENOUS | Status: AC
  Filled 2019-04-27: qty 10

## 2019-04-27 MED ORDER — PHENYLEPHRINE 40 MCG/ML (10ML) SYRINGE FOR IV PUSH (FOR BLOOD PRESSURE SUPPORT)
PREFILLED_SYRINGE | INTRAVENOUS | Status: AC
  Filled 2019-04-27: qty 10

## 2019-04-27 MED ORDER — DEXAMETHASONE SODIUM PHOSPHATE 10 MG/ML IJ SOLN
INTRAMUSCULAR | Status: AC
  Filled 2019-04-27: qty 1

## 2019-04-27 MED ORDER — PHENYLEPHRINE HCL-NACL 10-0.9 MG/250ML-% IV SOLN
INTRAVENOUS | Status: DC | PRN
  Administered 2019-04-27: 40 ug/min via INTRAVENOUS

## 2019-04-27 MED ORDER — ACETAMINOPHEN 325 MG PO TABS: 650.0000 mg | ORAL_TABLET | ORAL | Status: DC | PRN

## 2019-04-27 MED ORDER — MIDAZOLAM HCL 2 MG/2ML IJ SOLN
INTRAMUSCULAR | Status: AC
  Filled 2019-04-27: qty 2

## 2019-04-27 MED ORDER — FENTANYL CITRATE (PF) 250 MCG/5ML IJ SOLN
INTRAMUSCULAR | Status: DC | PRN
  Administered 2019-04-27: 50 ug via INTRAVENOUS
  Administered 2019-04-27: 100 ug via INTRAVENOUS
  Administered 2019-04-27 (×5): 50 ug via INTRAVENOUS

## 2019-04-27 MED ORDER — FLEET ENEMA 7-19 GM/118ML RE ENEM: 1.0000 | ENEMA | Freq: Once | RECTAL | Status: DC | PRN

## 2019-04-27 MED ORDER — PROPOFOL 10 MG/ML IV BOLUS
INTRAVENOUS | Status: DC | PRN
  Administered 2019-04-27: 150 mg via INTRAVENOUS
  Administered 2019-04-27: 50 mg via INTRAVENOUS

## 2019-04-27 MED ORDER — SODIUM CHLORIDE 0.9% FLUSH: 3.0000 mL | INTRAVENOUS | Status: DC | PRN

## 2019-04-27 MED ORDER — GABAPENTIN 600 MG PO TABS
600.0000 mg | ORAL_TABLET | Freq: Three times a day (TID) | ORAL | Status: DC
  Administered 2019-04-27 – 2019-04-29 (×5): 600 mg via ORAL
  Filled 2019-04-27 (×5): qty 1

## 2019-04-27 MED ORDER — KETAMINE HCL 50 MG/5ML IJ SOSY
PREFILLED_SYRINGE | INTRAMUSCULAR | Status: AC
  Filled 2019-04-27: qty 5

## 2019-04-27 MED ORDER — METHOCARBAMOL 500 MG PO TABS
500.0000 mg | ORAL_TABLET | Freq: Four times a day (QID) | ORAL | Status: DC | PRN
  Administered 2019-04-27 – 2019-04-29 (×5): 500 mg via ORAL
  Filled 2019-04-27 (×5): qty 1

## 2019-04-27 MED ORDER — ENSURE PRE-SURGERY PO LIQD
296.0000 mL | Freq: Once | ORAL | Status: AC
  Administered 2019-04-27: 296 mL via ORAL
  Filled 2019-04-27: qty 296

## 2019-04-27 MED ORDER — PANTOPRAZOLE SODIUM 40 MG PO TBEC
40.0000 mg | DELAYED_RELEASE_TABLET | Freq: Every day | ORAL | Status: DC
  Administered 2019-04-27 – 2019-04-29 (×2): 40 mg via ORAL
  Filled 2019-04-27 (×2): qty 1

## 2019-04-27 MED ORDER — GLYCOPYRROLATE PF 0.2 MG/ML IJ SOSY
PREFILLED_SYRINGE | INTRAMUSCULAR | Status: AC
  Filled 2019-04-27: qty 1

## 2019-04-27 MED ORDER — MIDAZOLAM HCL 5 MG/5ML IJ SOLN
INTRAMUSCULAR | Status: DC | PRN
  Administered 2019-04-27: 2 mg via INTRAVENOUS

## 2019-04-27 MED ORDER — PHENYLEPHRINE 40 MCG/ML (10ML) SYRINGE FOR IV PUSH (FOR BLOOD PRESSURE SUPPORT)
PREFILLED_SYRINGE | INTRAVENOUS | Status: DC | PRN
  Administered 2019-04-27: 120 ug via INTRAVENOUS
  Administered 2019-04-27: 40 ug via INTRAVENOUS
  Administered 2019-04-27: 20 ug via INTRAVENOUS
  Administered 2019-04-27: 80 ug via INTRAVENOUS
  Administered 2019-04-27 (×9): 40 ug via INTRAVENOUS

## 2019-04-27 MED ORDER — SODIUM CHLORIDE 0.9 % IV SOLN: 250.0000 mL | INTRAVENOUS | Status: DC

## 2019-04-27 MED ORDER — OXYCODONE-ACETAMINOPHEN 5-325 MG PO TABS
1.0000 | ORAL_TABLET | ORAL | Status: DC | PRN
  Administered 2019-04-27 – 2019-04-29 (×7): 2 via ORAL
  Filled 2019-04-27 (×7): qty 2

## 2019-04-27 MED ORDER — FENTANYL CITRATE (PF) 250 MCG/5ML IJ SOLN
INTRAMUSCULAR | Status: AC
  Filled 2019-04-27: qty 5

## 2019-04-27 MED ORDER — ONDANSETRON HCL 4 MG/2ML IJ SOLN
INTRAMUSCULAR | Status: DC | PRN
  Administered 2019-04-27: 4 mg via INTRAVENOUS

## 2019-04-27 MED ORDER — CHLORHEXIDINE GLUCONATE 4 % EX LIQD: 60.0000 mL | Freq: Once | CUTANEOUS | Status: DC

## 2019-04-27 MED ORDER — PROPOFOL 10 MG/ML IV BOLUS
INTRAVENOUS | Status: AC
  Filled 2019-04-27: qty 20

## 2019-04-27 MED ORDER — THROMBIN 20000 UNITS EX SOLR
CUTANEOUS | Status: AC
  Filled 2019-04-27: qty 20000

## 2019-04-27 MED ORDER — ALUM & MAG HYDROXIDE-SIMETH 200-200-20 MG/5ML PO SUSP: 30.0000 mL | Freq: Four times a day (QID) | ORAL | Status: DC | PRN

## 2019-04-27 MED ORDER — LIDOCAINE 2% (20 MG/ML) 5 ML SYRINGE
INTRAMUSCULAR | Status: AC
  Filled 2019-04-27: qty 5

## 2019-04-27 MED ORDER — ONDANSETRON HCL 4 MG/2ML IJ SOLN
INTRAMUSCULAR | Status: AC
  Filled 2019-04-27: qty 2

## 2019-04-27 MED ORDER — METHOCARBAMOL 1000 MG/10ML IJ SOLN
500.0000 mg | Freq: Four times a day (QID) | INTRAVENOUS | Status: DC | PRN
  Filled 2019-04-27: qty 5

## 2019-04-27 MED ORDER — LACTATED RINGERS IV SOLN: INTRAVENOUS | Status: DC

## 2019-04-27 MED ORDER — HYDROMORPHONE HCL 1 MG/ML IJ SOLN
INTRAMUSCULAR | Status: AC
  Filled 2019-04-27: qty 0.5

## 2019-04-27 MED ORDER — ACETAMINOPHEN 500 MG PO TABS
1000.0000 mg | ORAL_TABLET | Freq: Once | ORAL | Status: AC
  Administered 2019-04-27: 1000 mg via ORAL

## 2019-04-27 MED ORDER — SUGAMMADEX SODIUM 200 MG/2ML IV SOLN
INTRAVENOUS | Status: DC | PRN
  Administered 2019-04-27: 400 mg via INTRAVENOUS

## 2019-04-27 MED ORDER — LACTATED RINGERS IV SOLN: INTRAVENOUS | Status: DC | PRN

## 2019-04-27 MED ORDER — SUCCINYLCHOLINE CHLORIDE 200 MG/10ML IV SOSY
PREFILLED_SYRINGE | INTRAVENOUS | Status: AC
  Filled 2019-04-27: qty 10

## 2019-04-27 MED ORDER — FENTANYL CITRATE (PF) 100 MCG/2ML IJ SOLN: 25.0000 ug | INTRAMUSCULAR | Status: DC | PRN

## 2019-04-27 MED ORDER — SODIUM CHLORIDE (PF) 0.9 % IJ SOLN
INTRAMUSCULAR | Status: AC
  Filled 2019-04-27: qty 10

## 2019-04-27 MED ORDER — LORATADINE 10 MG PO TABS: 10.0000 mg | ORAL_TABLET | Freq: Every day | ORAL | Status: DC

## 2019-04-27 MED ORDER — ACETAMINOPHEN 500 MG PO TABS
ORAL_TABLET | ORAL | Status: AC
  Filled 2019-04-27: qty 2

## 2019-04-27 MED ORDER — EPHEDRINE 5 MG/ML INJ
INTRAVENOUS | Status: AC
  Filled 2019-04-27: qty 10

## 2019-04-27 MED ORDER — ZOLPIDEM TARTRATE 5 MG PO TABS: 5.0000 mg | ORAL_TABLET | Freq: Every evening | ORAL | Status: DC | PRN

## 2019-04-27 MED ORDER — LABETALOL HCL 5 MG/ML IV SOLN
INTRAVENOUS | Status: DC | PRN
  Administered 2019-04-27: 5 mg via INTRAVENOUS

## 2019-04-27 MED ORDER — ROCURONIUM BROMIDE 10 MG/ML (PF) SYRINGE
PREFILLED_SYRINGE | INTRAVENOUS | Status: DC | PRN
  Administered 2019-04-27: 20 mg via INTRAVENOUS
  Administered 2019-04-27: 30 mg via INTRAVENOUS
  Administered 2019-04-27: 70 mg via INTRAVENOUS
  Administered 2019-04-27: 50 mg via INTRAVENOUS
  Administered 2019-04-27: 10 mg via INTRAVENOUS

## 2019-04-27 MED ORDER — ONDANSETRON HCL 4 MG/2ML IJ SOLN: 4.0000 mg | Freq: Four times a day (QID) | INTRAMUSCULAR | Status: DC | PRN

## 2019-04-27 MED ORDER — ONDANSETRON HCL 4 MG PO TABS: 4.0000 mg | ORAL_TABLET | Freq: Four times a day (QID) | ORAL | Status: DC | PRN

## 2019-04-27 MED ORDER — SCOPOLAMINE 1 MG/3DAYS TD PT72
1.0000 | MEDICATED_PATCH | TRANSDERMAL | Status: DC
  Administered 2019-04-27: 1.5 mg via TRANSDERMAL

## 2019-04-27 MED ORDER — BUPIVACAINE LIPOSOME 1.3 % IJ SUSP
20.0000 mL | INTRAMUSCULAR | Status: DC
  Filled 2019-04-27: qty 20

## 2019-04-27 MED ORDER — CEFAZOLIN SODIUM-DEXTROSE 2-4 GM/100ML-% IV SOLN
2.0000 g | Freq: Three times a day (TID) | INTRAVENOUS | Status: AC
  Administered 2019-04-27 (×2): 2 g via INTRAVENOUS
  Filled 2019-04-27 (×2): qty 100

## 2019-04-27 MED ORDER — LIDOCAINE 2% (20 MG/ML) 5 ML SYRINGE
INTRAMUSCULAR | Status: DC | PRN
  Administered 2019-04-27: 80 mg via INTRAVENOUS

## 2019-04-27 MED ORDER — POTASSIUM CHLORIDE IN NACL 20-0.9 MEQ/L-% IV SOLN: INTRAVENOUS | Status: DC

## 2019-04-27 MED ORDER — NORETHINDRONE-ETH ESTRADIOL 1-35 MG-MCG PO TABS: 1.0000 | ORAL_TABLET | Freq: Every day | ORAL | Status: DC

## 2019-04-27 MED ORDER — LABETALOL HCL 5 MG/ML IV SOLN
INTRAVENOUS | Status: AC
  Filled 2019-04-27: qty 4

## 2019-04-27 MED ORDER — CHLORHEXIDINE GLUCONATE CLOTH 2 % EX PADS
6.0000 | MEDICATED_PAD | Freq: Every day | CUTANEOUS | Status: DC
  Administered 2019-04-27 – 2019-04-28 (×2): 6 via TOPICAL

## 2019-04-27 MED ORDER — PHENOL 1.4 % MT LIQD: 1.0000 | OROMUCOSAL | Status: DC | PRN

## 2019-04-27 MED ORDER — DOCUSATE SODIUM 100 MG PO CAPS
100.0000 mg | ORAL_CAPSULE | Freq: Two times a day (BID) | ORAL | Status: DC
  Administered 2019-04-27 – 2019-04-29 (×3): 100 mg via ORAL
  Filled 2019-04-27 (×3): qty 1

## 2019-04-27 MED ORDER — MENTHOL 3 MG MT LOZG: 1.0000 | LOZENGE | OROMUCOSAL | Status: DC | PRN

## 2019-04-27 MED ORDER — SPIRONOLACTONE 25 MG PO TABS
25.0000 mg | ORAL_TABLET | Freq: Every day | ORAL | Status: DC
  Administered 2019-04-29: 25 mg via ORAL
  Filled 2019-04-27 (×3): qty 1

## 2019-04-27 MED ORDER — SENNOSIDES-DOCUSATE SODIUM 8.6-50 MG PO TABS: 1.0000 | ORAL_TABLET | Freq: Every evening | ORAL | Status: DC | PRN

## 2019-04-27 MED ORDER — THROMBIN 20000 UNITS EX SOLR
CUTANEOUS | Status: DC | PRN
  Administered 2019-04-27: 20 mL via TOPICAL

## 2019-04-27 MED ORDER — SODIUM CHLORIDE 0.9% FLUSH
3.0000 mL | Freq: Two times a day (BID) | INTRAVENOUS | Status: DC
  Administered 2019-04-27: 3 mL via INTRAVENOUS

## 2019-04-27 MED ORDER — POVIDONE-IODINE 7.5 % EX SOLN
Freq: Once | CUTANEOUS | Status: DC
  Filled 2019-04-27: qty 118

## 2019-04-27 MED ORDER — PROMETHAZINE HCL 25 MG/ML IJ SOLN: 6.2500 mg | INTRAMUSCULAR | Status: DC | PRN

## 2019-04-27 MED ORDER — HYDROMORPHONE HCL 1 MG/ML IJ SOLN
INTRAMUSCULAR | Status: DC | PRN
  Administered 2019-04-27: .5 mg via INTRAVENOUS

## 2019-04-27 MED ORDER — BISACODYL 5 MG PO TBEC: 5.0000 mg | DELAYED_RELEASE_TABLET | Freq: Every day | ORAL | Status: DC | PRN

## 2019-04-27 MED ORDER — NEOSTIGMINE METHYLSULFATE 3 MG/3ML IV SOSY
PREFILLED_SYRINGE | INTRAVENOUS | Status: AC
  Filled 2019-04-27: qty 3

## 2019-04-27 MED ORDER — SCOPOLAMINE 1 MG/3DAYS TD PT72
MEDICATED_PATCH | TRANSDERMAL | Status: AC
  Filled 2019-04-27: qty 1

## 2019-04-27 MED ORDER — ARTIFICIAL TEARS OPHTHALMIC OINT
TOPICAL_OINTMENT | OPHTHALMIC | Status: AC
  Filled 2019-04-27: qty 3.5

## 2019-04-27 MED ORDER — DEXTROSE 5 % IV SOLN
3.0000 g | INTRAVENOUS | Status: DC
  Filled 2019-04-27: qty 3000

## 2019-04-27 SURGICAL SUPPLY — 113 items
ADH SKN CLS APL DERMABOND .7 (GAUZE/BANDAGES/DRESSINGS) ×2
AGENT HMST KT MTR STRL THRMB (HEMOSTASIS)
APL SKNCLS STERI-STRIP NONHPOA (GAUZE/BANDAGES/DRESSINGS) ×2
APPLIER CLIP 11 MED OPEN (CLIP) ×4
APR CLP MED 11 20 MLT OPN (CLIP) ×2
BENZOIN TINCTURE PRP APPL 2/3 (GAUZE/BANDAGES/DRESSINGS) ×2 IMPLANT
BLADE CLIPPER SURG (BLADE) IMPLANT
BLADE SURG 10 STRL SS (BLADE) ×2 IMPLANT
BONE VIVIGEN FORMABLE 10CC (Bone Implant) ×4 IMPLANT
CATH FOLEY LATEX FREE 16FR (CATHETERS) ×4
CATH FOLEY LF 16FR (CATHETERS) IMPLANT
CLIP APPLIE 11 MED OPEN (CLIP) ×4 IMPLANT
CLOSURE STERI-STRIP 1/2X4 (GAUZE/BANDAGES/DRESSINGS) ×1
CLOSURE WOUND 1/2 X4 (GAUZE/BANDAGES/DRESSINGS)
CLSR STERI-STRIP ANTIMIC 1/2X4 (GAUZE/BANDAGES/DRESSINGS) ×1 IMPLANT
CORD BIPOLAR FORCEPS 12FT (ELECTRODE) ×2 IMPLANT
COVER BACK TABLE 60X90IN (DRAPES) ×4 IMPLANT
COVER SURGICAL LIGHT HANDLE (MISCELLANEOUS) ×2 IMPLANT
COVER WAND RF STERILE (DRAPES) ×4 IMPLANT
DERMABOND ADVANCED (GAUZE/BANDAGES/DRESSINGS) ×2
DERMABOND ADVANCED .7 DNX12 (GAUZE/BANDAGES/DRESSINGS) ×2 IMPLANT
DRAPE C-ARM 42X72 X-RAY (DRAPES) ×6 IMPLANT
DRAPE INCISE IOBAN 66X45 STRL (DRAPES) ×2 IMPLANT
DRAPE POUCH INSTRU U-SHP 10X18 (DRAPES) ×4 IMPLANT
DRAPE SURG 17X23 STRL (DRAPES) ×14 IMPLANT
DRSG MEPILEX BORDER 4X12 (GAUZE/BANDAGES/DRESSINGS) ×4 IMPLANT
DURAPREP 26ML APPLICATOR (WOUND CARE) ×4 IMPLANT
ELECT BLADE 4.0 EZ CLEAN MEGAD (MISCELLANEOUS) ×4
ELECT BLADE 6.5 EXT (BLADE) ×2 IMPLANT
ELECT CAUTERY BLADE 6.4 (BLADE) ×4 IMPLANT
ELECT REM PT RETURN 9FT ADLT (ELECTROSURGICAL) ×4
ELECTRODE BLDE 4.0 EZ CLN MEGD (MISCELLANEOUS) ×2 IMPLANT
ELECTRODE REM PT RTRN 9FT ADLT (ELECTROSURGICAL) ×2 IMPLANT
FEE INTRAOP MONITOR IMPULS NCS (MISCELLANEOUS) IMPLANT
GAUZE 4X4 16PLY RFD (DISPOSABLE) IMPLANT
GAUZE SPONGE 4X4 12PLY STRL LF (GAUZE/BANDAGES/DRESSINGS) ×2 IMPLANT
GLOVE BIO SURGEON STRL SZ7 (GLOVE) ×4 IMPLANT
GLOVE BIO SURGEON STRL SZ8 (GLOVE) ×4 IMPLANT
GLOVE BIOGEL PI IND STRL 7.0 (GLOVE) ×2 IMPLANT
GLOVE BIOGEL PI IND STRL 7.5 (GLOVE) IMPLANT
GLOVE BIOGEL PI IND STRL 8 (GLOVE) ×6 IMPLANT
GLOVE BIOGEL PI INDICATOR 7.0 (GLOVE) ×2
GLOVE BIOGEL PI INDICATOR 7.5 (GLOVE) ×4
GLOVE BIOGEL PI INDICATOR 8 (GLOVE) ×2
GLOVE ECLIPSE 7.5 STRL STRAW (GLOVE) ×4 IMPLANT
GLOVE SURG SS PI 7.5 STRL IVOR (GLOVE) ×8 IMPLANT
GOWN STRL REUS W/ TWL LRG LVL3 (GOWN DISPOSABLE) ×10 IMPLANT
GOWN STRL REUS W/ TWL XL LVL3 (GOWN DISPOSABLE) ×2 IMPLANT
GOWN STRL REUS W/TWL LRG LVL3 (GOWN DISPOSABLE) ×12
GOWN STRL REUS W/TWL XL LVL3 (GOWN DISPOSABLE) ×8
GRAFT BNE MATRIX VG FRMBL L 10 (Bone Implant) IMPLANT
HEMOSTAT SURGICEL 2X14 (HEMOSTASIS) IMPLANT
INSERT FOGARTY 61MM (MISCELLANEOUS) IMPLANT
INSERT FOGARTY SM (MISCELLANEOUS) IMPLANT
INTRAOP MONITOR FEE IMPULS NCS (MISCELLANEOUS) ×2
INTRAOP MONITOR FEE IMPULSE (MISCELLANEOUS) ×2
IV CATH AUTO 14GX1.75 SAFE ORG (IV SOLUTION) ×2 IMPLANT
KIT BASIN OR (CUSTOM PROCEDURE TRAY) ×4 IMPLANT
KIT TURNOVER KIT B (KITS) ×6 IMPLANT
LOOP VESSEL MAXI BLUE (MISCELLANEOUS) IMPLANT
LOOP VESSEL MINI RED (MISCELLANEOUS) IMPLANT
NDL HYPO 25GX1X1/2 BEV (NEEDLE) ×2 IMPLANT
NDL SPNL 18GX3.5 QUINCKE PK (NEEDLE) ×2 IMPLANT
NEEDLE HYPO 25GX1X1/2 BEV (NEEDLE) IMPLANT
NEEDLE SPNL 18GX3.5 QUINCKE PK (NEEDLE) ×4 IMPLANT
NS IRRIG 1000ML POUR BTL (IV SOLUTION) ×6 IMPLANT
PACK LAMINECTOMY NEURO (CUSTOM PROCEDURE TRAY) ×2 IMPLANT
PACK UNIVERSAL I (CUSTOM PROCEDURE TRAY) ×4 IMPLANT
PAD ARMBOARD 7.5X6 YLW CONV (MISCELLANEOUS) ×14 IMPLANT
PROBE PED SCREW MONOP 3 BT NCS (MISCELLANEOUS) IMPLANT
SCREW CANCELLOUS 6.5 32MM (Screw) ×2 IMPLANT
SCREW PROBE PEDICLE MONOPOLAR (MISCELLANEOUS) ×2
SPONGE INTESTINAL PEANUT (DISPOSABLE) ×14 IMPLANT
SPONGE LAP 18X18 RF (DISPOSABLE) ×4 IMPLANT
SPONGE LAP 4X18 RFD (DISPOSABLE) IMPLANT
SPONGE SURGIFOAM ABS GEL 100 (HEMOSTASIS) ×6 IMPLANT
STAPLER VISISTAT (STAPLE) ×2 IMPLANT
STRIP CLOSURE SKIN 1/2X4 (GAUZE/BANDAGES/DRESSINGS) IMPLANT
SURGIFLO W/THROMBIN 8M KIT (HEMOSTASIS) IMPLANT
SUT MNCRL AB 4-0 PS2 18 (SUTURE) ×6 IMPLANT
SUT PDS AB 1 CTX 36 (SUTURE) ×6 IMPLANT
SUT PROLENE 4 0 RB 1 (SUTURE)
SUT PROLENE 4-0 RB1 .5 CRCL 36 (SUTURE) IMPLANT
SUT PROLENE 5 0 C 1 24 (SUTURE) IMPLANT
SUT PROLENE 5 0 CC1 (SUTURE) IMPLANT
SUT PROLENE 6 0 C 1 30 (SUTURE) ×2 IMPLANT
SUT PROLENE 6 0 CC (SUTURE) IMPLANT
SUT SILK 0 TIES 10X30 (SUTURE) ×2 IMPLANT
SUT SILK 2 0 TIES 10X30 (SUTURE) ×4 IMPLANT
SUT SILK 2 0SH CR/8 30 (SUTURE) IMPLANT
SUT SILK 3 0 TIES 10X30 (SUTURE) ×6 IMPLANT
SUT SILK 3 0SH CR/8 30 (SUTURE) IMPLANT
SUT VIC AB 0 CT1 18XCR BRD8 (SUTURE) IMPLANT
SUT VIC AB 0 CT1 27 (SUTURE)
SUT VIC AB 0 CT1 27XBRD ANBCTR (SUTURE) ×2 IMPLANT
SUT VIC AB 0 CT1 8-18 (SUTURE) ×4
SUT VIC AB 1 CT1 27 (SUTURE)
SUT VIC AB 1 CT1 27XBRD ANBCTR (SUTURE) ×4 IMPLANT
SUT VIC AB 1 CTX 36 (SUTURE)
SUT VIC AB 1 CTX36XBRD ANBCTR (SUTURE) ×4 IMPLANT
SUT VIC AB 2-0 CT2 18 VCP726D (SUTURE) ×10 IMPLANT
SUT VIC AB 2-0 CTB1 (SUTURE) ×2 IMPLANT
SUT VIC AB 3-0 SH 27 (SUTURE)
SUT VIC AB 3-0 SH 27X BRD (SUTURE) ×2 IMPLANT
SYNCAGE EVOL MD 13.5X10 10D (Spacer) ×2 IMPLANT
SYR BULB IRRIGATION 50ML (SYRINGE) ×2 IMPLANT
TAPE CLOTH SURG 4X10 WHT LF (GAUZE/BANDAGES/DRESSINGS) ×2 IMPLANT
TOWEL GREEN STERILE (TOWEL DISPOSABLE) ×6 IMPLANT
TOWEL GREEN STERILE FF (TOWEL DISPOSABLE) ×4 IMPLANT
TRAY FOLEY W/BAG SLVR 16FR (SET/KITS/TRAYS/PACK)
TRAY FOLEY W/BAG SLVR 16FR ST (SET/KITS/TRAYS/PACK) ×2 IMPLANT
WATER STERILE IRR 1000ML POUR (IV SOLUTION) ×4 IMPLANT
YANKAUER SUCT BULB TIP NO VENT (SUCTIONS) ×4 IMPLANT

## 2019-04-27 NOTE — H&P (Signed)
PREOPERATIVE H&P  Chief Complaint: Low back pain, bilateral leg pain  HPI:  [name] is a 49 y.o. female who presents with ongoing pain in the low back and bilateral legs  MRI and CT reveals severe L4/5 DDD with instability  Patient has failed multiple forms of conservative care and continues to have pain (see office notes for additional details regarding the patient's full course of treatment)  Past Medical History:  Diagnosis Date  . Anemia   . Arthritis   . Depression    due to perimenopausal phase per pt.  Marland Kitchen GERD (gastroesophageal reflux disease)   . History of hiatal hernia   . Postpartum care following cesarean delivery (4/27) 07/28/2015  . Pregnancy induced hypertension    during pregnancy  . Sleep apnea    no machine  . Varicose veins   . Vitamin D deficiency    Past Surgical History:  Procedure Laterality Date  . CESAREAN SECTION N/A 07/28/2015   Procedure: CESAREAN SECTION;  Surgeon: Olivia Mackie, MD;  Location: [facility];  Service: Obstetrics;  Laterality: N/A;  . ESOPHAGEAL MANOMETRY N/A 01/02/2017   Procedure: ESOPHAGEAL MANOMETRY (EM);  Surgeon: Kerin Salen, MD;  Location: [facility] ENDOSCOPY;  Service: Gastroenterology;  Laterality: N/A;  . HERNIA REPAIR     hiatal hernia repair 03-06-17  . INSERTION OF MESH N/A 03/06/2017   Procedure: INSERTION OF MESH;  Surgeon: Axel Filler, MD;  Location: [facility];  Service: General;  Laterality: N/A;  . TOE SURGERY Bilateral    Social History   Socioeconomic History  . Marital status: Married    Spouse name: Not on file  . Number of children: Not on file  . Years of education: Not on file  . Highest education level: Not on file  Occupational History  . Not on file  Tobacco Use  . Smoking status: Former Smoker    Quit date: 03/06/2011    Years since quitting: 8.1  . Smokeless tobacco: Never Used  Substance and Sexual Activity  . Alcohol use: No    Alcohol/week: 0.0 standard drinks  . Drug use: No  . Sexual  activity: Yes    Birth control/protection: Pill  Other Topics Concern  . Not on file  Social History Narrative   ** Merged History Encounter **       Social Determinants of Health   Financial Resource Strain:   . Difficulty of Paying Living Expenses: Not on file  Food Insecurity:   . Worried About Programme researcher, broadcasting/film/video in the Last Year: Not on file  . Ran Out of Food in the Last Year: Not on file  Transportation Needs:   . Lack of Transportation (Medical): Not on file  . Lack of Transportation (Non-Medical): Not on file  Physical Activity:   . Days of Exercise per Week: Not on file  . Minutes of Exercise per Session: Not on file  Stress:   . Feeling of Stress : Not on file  Social Connections:   . Frequency of Communication with Friends and Family: Not on file  . Frequency of Social Gatherings with Friends and Family: Not on file  . Attends Religious Services: Not on file  . Active Member of Clubs or Organizations: Not on file  . Attends Banker Meetings: Not on file  . Marital Status: Not on file   Family History  Problem Relation Age of Onset  . Heart disease Mother   . Diabetes Mother   .  Hypertension Mother   . Cancer Father   . Hypertension Father   . Asthma Sister   . Seizures Sister   . Bipolar disorder Sister    Allergies  Allergen Reactions  . Ibuprofen Anaphylaxis  . Tramadol Nausea And Vomiting   Prior to Admission medications   Medication Sig Start Date End Date Taking? Authorizing Provider  cetirizine (ZYRTEC) 10 MG tablet Take 10 mg by mouth daily as needed for allergies (alternating with Allegra).   Yes [provider]  cyclobenzaprine (FLEXERIL) 5 MG tablet Take 5-10 mg by mouth at bedtime. 03/19/19  Yes [provider]  fexofenadine (ALLEGRA) 60 MG tablet Take 60 mg by mouth daily as needed for allergies or rhinitis (alternating with Zyrtec).   Yes [provider]  gabapentin (NEURONTIN) 600 MG tablet Take 600  mg by mouth 3 (three) times daily.  02/01/19  Yes [provider]  HYDROcodone-acetaminophen (NORCO/VICODIN) 5-325 MG tablet Take 1 tablet by mouth every 6 (six) hours as needed for moderate pain.    Yes [provider]  meloxicam (MOBIC) 15 MG tablet Take 15 mg by mouth daily.   Yes [provider]  norethindrone-ethinyl estradiol 1/35 (ALAYCEN 1/35) tablet Take 1 tablet by mouth daily.   Yes [provider]  pantoprazole (PROTONIX) 40 MG tablet Take 1 tablet (40 mg total) by mouth daily. 07/11/15  Yes Servando Salina, MD  spironolactone (ALDACTONE) 25 MG tablet Take 25 mg by mouth daily. 04/07/19  Yes [provider]     All other systems have been reviewed and were otherwise negative with the exception of those mentioned in the HPI and as above.  Physical Exam: Vitals:   04/27/19 0617  BP: (!) 184/108  Pulse: (!) 104  Resp: 18  Temp: 98.8 F (37.1 C)  SpO2: 96%    Body mass index is 47.93 kg/m.  General: Alert, no acute distress Cardiovascular: No pedal edema Respiratory: No cyanosis, no use of accessory musculature Skin: No lesions in the area of chief complaint Neurologic: Sensation intact distally Psychiatric: Patient is competent for consent with normal mood and affect Lymphatic: No axillary or cervical lymphadenopathy   Assessment/Plan: SEVERE L4/5 DDD AND LUMBAR RADICULOPATHY  Plan for Procedure(s): ANTERIOR LUMBAR INTERBODY FUSION LUMBAR 4-5 WITH INSTRUMENTATION AND ALLOGRAFT    Norva Karvonen, MD 04/27/2019 6:40 AM

## 2019-04-27 NOTE — Op Note (Signed)
    NAME: [name]    MRN: [id number] DOB: [birth year]    DATE OF OPERATION: 04/27/2019  PREOP DIAGNOSIS:    Degenerative disc disease L4-L5  POSTOP DIAGNOSIS:    Same  PROCEDURE:    Anterior retroperitoneal exposure L4-L5  EXPOSURE SURGEON: Di Kindle. Edilia Bo, MD  SPINE SURGEON: Estill Bamberg MD  ASSIST: Jason Coop, PA  ANESTHESIA: General  EBL: Minimal  INDICATIONS:    [name] is a 49 y.o. female who presents with degenerative disc disease L4-L5.  The patient was morbidly obese with a BMI of 48.  For this reason she was felt to be at increased risk for surgery, however her symptoms were felt to be significantly disabling and she wished to proceed with surgery understanding these risks.  FINDINGS:   Significant degenerative disc disease L4-L5  TECHNIQUE:   The patient was taken to the operating room and received a general anesthetic.  Pulse oximeter was placed on the left foot.  Monitoring lines were placed by anesthesia.  The level of the L4-L5 disc space was marked under fluoroscopy.  Given her size I elected to make a paramedian incision.  This incision was marked and the patient was then prepped and draped in the usual sterile fashion.  A left paramedian incision was made centered around the marked level and the dissection carried down to the subcutaneous tissue to the anterior rectus sheath.  This dissection was quite deep.  The anterior rectus sheath was divided longitudinally and then mobilized medially and laterally such that the muscle was controlled circumferentially.  The muscle was retracted medially.  The posterior rectus sheath was divided sharply with a 15 blade above the arcuate line and the incision extended proximally and distally.  The retroperitoneal space was entered.  Bluntly dissection was carried to the psoas and then onto the external iliac artery.  The external iliac artery and common iliac artery were fully mobilized to allow this to be  retracted to the patient's right.  This thus exposed the left common iliac vein deep to the artery.  Using blunt dissection the iliolumbar vein was identified.  It was quite deep but I was able to get a suture ligature proximally and then 3 clips the vein was divided.  A second iliolumbar vein was divided divided further proximally again usin a 2-0 silk tie and 3 clips.  This allowed mobilization of the left common iliac vein to the patient's right exposing the L4-L5 disc space.  Segmental branches further proximally were divided with electrocautery allowing mobilization of the aorta and venous system to the patient's right.  The Thompson retractor system was placed.  The reverse lip retractor was placed to the right of the vertebral body allowing exposure of the L4-L5 displaced.  Additional retractors were placed and position was confirmed under fluoroscopy.  The remainder of the dictation is as per Dr. Yevette Edwards.  Waverly Ferrari, MD, FACS [facility]  DATE OF DICTATION:   04/27/2019

## 2019-04-27 NOTE — Transfer of Care (Signed)
Immediate Anesthesia Transfer of Care Note  Patient: Gianna Calef  Procedure(s) Performed: ANTERIOR LUMBAR INTERBODY FUSION LUMBAR FOUR-FIVE WITH INSTRUMENTATION AND ALLOGRAFT (N/A Spine Lumbar) ABDOMINAL EXPOSURE (N/A )  Patient Location: PACU  Anesthesia Type:General  Level of Consciousness: awake, alert  and oriented  Airway & Oxygen Therapy: Patient Spontanous Breathing and Patient connected to face mask oxygen  Post-op Assessment: Report given to RN, Post -op Vital signs reviewed and stable and Patient moving all extremities  Post vital signs: Reviewed and stable  Last Vitals:  Vitals Value Taken Time  BP 138/68 04/27/19 1156  Temp    Pulse 95 04/27/19 1159  Resp 23 04/27/19 1159  SpO2 91 % 04/27/19 1159  Vitals shown include unvalidated device data.  Last Pain:  Vitals:   04/27/19 0617  TempSrc: Oral  PainSc:          Complications: No apparent anesthesia complications

## 2019-04-27 NOTE — Op Note (Signed)
PATIENT NAME: [name]   MEDICAL RECORD NO.:   [id number]   DATE OF BIRTH: 29-Jan-1971   DATE OF PROCEDURE: 04/27/2019                               OPERATIVE REPORT    PREOPERATIVE DIAGNOSES: 1. Severe right L4 radiculopathy. 2. Severe L4-5 degenerative disc disease with a spondylolisthesis 3. Severe bilateral L4-5 neuroforaminal stenosis   POSTOPERATIVE DIAGNOSES: 1. Severe right L4 radiculopathy. 2. Severe L4-5 degenerative disc disease with a spondylolisthesis 3. Severe bilateral L4-5 neuroforaminal stenosis   PROCEDURE (stage 1 of 2): 1. Anterior lumbar interbody fusion, L4-5 (expsure peformed by Dr. Gae Gallop) 2. Insertion of interbody device x 1 (13.5 mm Syncage spacer). 3. Placement of anterior instrumentation securing the L4-5 level. 4. Intraoperative use of fluoroscopy. 5. Use of morselized allograft - Vivigen   SURGEON:  Phylliss Bob, MD   ASSISTANT:  Pricilla Holm, PA-C.   ANESTHESIA:  General endotracheal anesthesia.   COMPLICATIONS:  None.   DISPOSITION:  Stable.   ESTIMATED BLOOD LOSS:  Minimal.   INDICATIONS FOR SURGERY:  Briefly, Ms. Cleckley is a very pleasant 49 year old female, who has been having progressive debilitating pain in the right leg and low back. The patient did fail appropriate nonoperative measures, but did continue to have significant pain.  Her imaging studies were notable for severe L4-5 degenerative disc disease.  Her disc was so severely degenerated, that I did proceed with a work-up to ensure that she did not have discitis.  Her work-up was negative for this.  She did however clearly have severe degenerative disc disease and severe neuroforaminal stenosis and instability. Given her ongoing pain and dysfunction, we did discuss proceeding with the procedure noted above.  The patient was fully aware of the risks and limitations associated with surgery, and did wish to proceed.  The plan was to proceed with stage 1 of her procedure today,  and to return tomorrow for stage 2, specifically, a posterior fusion, and potentially a posterior decompression.  Of note, both myself and Dr. Scot Dock did discuss extensively with the patient, given her morbid obesity, she was at a substantial increased risk of complications, including vascular injury, DVT, infection, as well as nonunion.  She did understand that if a nonunion were to occur, additional surgery may be needed to address this.  After full understanding of this, she did wish to proceed.     OPERATIVE DETAILS:  On 05/07/2019, the patient was brought to surgery and general endotracheal anesthesia was administered.  The patient was placed supine on the hospital bed.  The patient's abdomen was prepped and draped in the usual sterile fashion.  An anterior retroperitoneal approach was then performed by Dr. Gae Gallop.  Once the anterior lumbar spine was noted, we did focus our attention on the L4/5 intervertebral space.  The intervertebral space was safely and adequately exposed by Dr. Scot Dock.  He did place self-retaining retractors.  At this point, I performed a thorough and complete L4/5 intervertebral diskectomy to the level of the posterior longitudinal ligament.  I was very pleased with the diskectomy that I was able to accomplish.   As noted in the patient's preoperative imaging, the endplates were very irregular and partially eroded.  I therefore was very meticulous in order to ensure minimal undue violation of the endplates that did remain. The endplates were then appropriately prepared and the appropriate sized anterior intervertebral  spacer was packed with Vivigen and tamped into position. I was extremely pleased with the height restoration and the press-fit of the implant.  I then proceeded with placement of anterior instrumentation at the L4/5 intervertebral space.  To accomplish this, I did use an awl at the L5 vertebral body to prepare the trajectory of the anterior vertebral body  screw.  An anterior fixation device was attached to the back end of the screw, which was inserted into the L5 vertebral body, which did provide anterior fixation across the L4/5 intervertebral space, securing the intervertebral implant into place.  I was very pleased with the press-fit of the anterior hardware. I did liberally use AP and lateral fluoroscopy to ensure that the implants and anterior fixation was in the appropriate position, and was very pleased with the radiographs. The wound was copiously irrigated.  The fascia was  closed using #1 PDS.  The subcutaneous layer was closed using 0 Vicryl followed by 2-0 Vicryl, and the skin was closed using 4-0 Monocryl. Benzoin and Steri-Strips were applied followed by sterile dressing.   All instrument counts were correct at the termination of the procedure. I did use neurologic monitoring throughout the surgery, and there was no abnormal EMG activity noted throughout the surgery.   Of note, Jason Coop was my assistant throughout surgery, and did aid in retraction, suctioning, placement of the instrumentation, and closure for the entire procedure.     Estill Bamberg, MD

## 2019-04-27 NOTE — Anesthesia Procedure Notes (Addendum)
Procedure Name: Intubation Date/Time: 04/27/2019 7:35 AM Performed by: Amadeo Garnet, CRNA Pre-anesthesia Checklist: Patient identified, Emergency Drugs available, Suction available, Patient being monitored and Timeout performed Patient Re-evaluated:Patient Re-evaluated prior to induction Oxygen Delivery Method: Circle system utilized Preoxygenation: Pre-oxygenation with 100% oxygen Induction Type: IV induction Ventilation: Mask ventilation without difficulty and Oral airway inserted - appropriate to patient size Laryngoscope Size: Mac and 4 Grade View: Grade I Tube type: Oral Tube size: 7.0 mm Airway Equipment and Method: Stylet Placement Confirmation: ETT inserted through vocal cords under direct vision,  breath sounds checked- equal and bilateral and positive ETCO2 Secured at: 22 cm Tube secured with: Tape Dental Injury: Teeth and Oropharynx as per pre-operative assessment

## 2019-04-27 NOTE — Anesthesia Postprocedure Evaluation (Signed)
Anesthesia Post Note  Patient: Naval architect  Procedure(s) Performed: ANTERIOR LUMBAR INTERBODY FUSION LUMBAR FOUR-FIVE WITH INSTRUMENTATION AND ALLOGRAFT (N/A Spine Lumbar) ABDOMINAL EXPOSURE (N/A )     Patient location during evaluation: PACU Anesthesia Type: General Level of consciousness: sedated Pain management: pain level controlled Vital Signs Assessment: post-procedure vital signs reviewed and stable Respiratory status: spontaneous breathing and respiratory function stable Cardiovascular status: stable Postop Assessment: no apparent nausea or vomiting Anesthetic complications: no    Last Vitals:  Vitals:   04/27/19 1244 04/27/19 1317  BP: (!) 150/94 117/68  Pulse: 82 86  Resp: (!) 22 20  Temp:  36.5 C  SpO2: 96% 96%    Last Pain:  Vitals:   04/27/19 1317  TempSrc: Oral  PainSc:                  Voncile Schwarz DANIEL

## 2019-04-28 ENCOUNTER — Inpatient Hospital Stay (HOSPITAL_COMMUNITY): Payer: BC Managed Care – PPO

## 2019-04-28 ENCOUNTER — Inpatient Hospital Stay (HOSPITAL_COMMUNITY)
Admission: RE | Admit: 2019-04-28 | Payer: BC Managed Care – PPO | Source: Home / Self Care | Admitting: Orthopedic Surgery

## 2019-04-28 ENCOUNTER — Encounter (HOSPITAL_COMMUNITY)
Admission: RE | Disposition: A | Discharge status: Home / Self Care | Payer: Self-pay | Source: Home / Self Care | Attending: Orthopedic Surgery

## 2019-04-28 ENCOUNTER — Inpatient Hospital Stay (HOSPITAL_COMMUNITY): Payer: BC Managed Care – PPO | Admitting: Anesthesiology

## 2019-04-28 ENCOUNTER — Encounter (HOSPITAL_COMMUNITY): Payer: Self-pay | Admitting: Orthopedic Surgery

## 2019-04-28 SURGERY — POSTERIOR LUMBAR FUSION 1 LEVEL
Anesthesia: General

## 2019-04-28 MED ORDER — DEXTROSE 5 % IV SOLN: INTRAVENOUS | Status: DC | PRN

## 2019-04-28 MED ORDER — ENSURE PRE-SURGERY PO LIQD
296.0000 mL | Freq: Once | ORAL | Status: DC
  Filled 2019-04-28: qty 296

## 2019-04-28 MED ORDER — OXYCODONE HCL 5 MG PO TABS: 5.0000 mg | ORAL_TABLET | Freq: Once | ORAL | Status: DC | PRN

## 2019-04-28 MED ORDER — ACETAMINOPHEN 10 MG/ML IV SOLN
INTRAVENOUS | Status: DC | PRN
  Administered 2019-04-28: 1000 mg via INTRAVENOUS

## 2019-04-28 MED ORDER — ACETAMINOPHEN 500 MG PO TABS: 1000.0000 mg | ORAL_TABLET | Freq: Once | ORAL | Status: DC | PRN

## 2019-04-28 MED ORDER — BUPIVACAINE HCL (PF) 0.25 % IJ SOLN
INTRAMUSCULAR | Status: DC | PRN
  Administered 2019-04-28: 30 mL

## 2019-04-28 MED ORDER — CEFAZOLIN SODIUM-DEXTROSE 2-4 GM/100ML-% IV SOLN
INTRAVENOUS | Status: AC
  Filled 2019-04-28: qty 100

## 2019-04-28 MED ORDER — MIDAZOLAM HCL 5 MG/5ML IJ SOLN
INTRAMUSCULAR | Status: DC | PRN
  Administered 2019-04-28: 2 mg via INTRAVENOUS

## 2019-04-28 MED ORDER — PHENYLEPHRINE HCL-NACL 10-0.9 MG/250ML-% IV SOLN
INTRAVENOUS | Status: DC | PRN
  Administered 2019-04-28: 25 ug/min via INTRAVENOUS

## 2019-04-28 MED ORDER — DEXTROSE 5 % IV SOLN
INTRAVENOUS | Status: DC | PRN
  Administered 2019-04-28: 3 g via INTRAVENOUS

## 2019-04-28 MED ORDER — BUPIVACAINE LIPOSOME 1.3 % IJ SUSP
20.0000 mL | INTRAMUSCULAR | Status: DC
  Filled 2019-04-28 (×2): qty 20

## 2019-04-28 MED ORDER — ALBUMIN HUMAN 5 % IV SOLN: INTRAVENOUS | Status: DC | PRN

## 2019-04-28 MED ORDER — LIDOCAINE 2% (20 MG/ML) 5 ML SYRINGE
INTRAMUSCULAR | Status: DC | PRN
  Administered 2019-04-28: 100 mg via INTRAVENOUS

## 2019-04-28 MED ORDER — OXYCODONE HCL 5 MG/5ML PO SOLN: 5.0000 mg | Freq: Once | ORAL | Status: DC | PRN

## 2019-04-28 MED ORDER — BUPIVACAINE HCL (PF) 0.25 % IJ SOLN
INTRAMUSCULAR | Status: AC
  Filled 2019-04-28: qty 30

## 2019-04-28 MED ORDER — LACTATED RINGERS IV SOLN: INTRAVENOUS | Status: DC

## 2019-04-28 MED ORDER — FENTANYL CITRATE (PF) 100 MCG/2ML IJ SOLN
INTRAMUSCULAR | Status: DC | PRN
  Administered 2019-04-28 (×3): 50 ug via INTRAVENOUS
  Administered 2019-04-28: 100 ug via INTRAVENOUS

## 2019-04-28 MED ORDER — LABETALOL HCL 5 MG/ML IV SOLN
5.0000 mg | Freq: Once | INTRAVENOUS | Status: AC
  Administered 2019-04-28: 5 mg via INTRAVENOUS

## 2019-04-28 MED ORDER — EPINEPHRINE PF 1 MG/ML IJ SOLN
INTRAMUSCULAR | Status: AC
  Filled 2019-04-28: qty 1

## 2019-04-28 MED ORDER — THROMBIN 20000 UNITS EX SOLR
CUTANEOUS | Status: DC | PRN
  Administered 2019-04-28: 20000 [IU] via TOPICAL

## 2019-04-28 MED ORDER — 0.9 % SODIUM CHLORIDE (POUR BTL) OPTIME
TOPICAL | Status: DC | PRN
  Administered 2019-04-28: 1000 mL

## 2019-04-28 MED ORDER — BUPIVACAINE LIPOSOME 1.3 % IJ SUSP
INTRAMUSCULAR | Status: DC | PRN
  Administered 2019-04-28: 20 mL

## 2019-04-28 MED ORDER — POVIDONE-IODINE 7.5 % EX SOLN: Freq: Once | CUTANEOUS | Status: DC

## 2019-04-28 MED ORDER — CEFAZOLIN SODIUM-DEXTROSE 1-4 GM/50ML-% IV SOLN
INTRAVENOUS | Status: AC
  Filled 2019-04-28: qty 50

## 2019-04-28 MED ORDER — FENTANYL CITRATE (PF) 100 MCG/2ML IJ SOLN
INTRAMUSCULAR | Status: AC
  Filled 2019-04-28: qty 2

## 2019-04-28 MED ORDER — ACETAMINOPHEN 160 MG/5ML PO SOLN: 1000.0000 mg | Freq: Once | ORAL | Status: DC | PRN

## 2019-04-28 MED ORDER — ACETAMINOPHEN 10 MG/ML IV SOLN: 1000.0000 mg | Freq: Once | INTRAVENOUS | Status: DC | PRN

## 2019-04-28 MED ORDER — SUCCINYLCHOLINE CHLORIDE 20 MG/ML IJ SOLN
INTRAMUSCULAR | Status: DC | PRN
  Administered 2019-04-28: 140 mg via INTRAVENOUS

## 2019-04-28 MED ORDER — LABETALOL HCL 5 MG/ML IV SOLN
INTRAVENOUS | Status: AC
  Filled 2019-04-28: qty 4

## 2019-04-28 MED ORDER — PROPOFOL 500 MG/50ML IV EMUL
INTRAVENOUS | Status: DC | PRN
  Administered 2019-04-28: 50 ug/kg/min via INTRAVENOUS
  Administered 2019-04-28: 25 ug/kg/min via INTRAVENOUS

## 2019-04-28 MED ORDER — FENTANYL CITRATE (PF) 100 MCG/2ML IJ SOLN
25.0000 ug | INTRAMUSCULAR | Status: DC | PRN
  Administered 2019-04-28 (×2): 50 ug via INTRAVENOUS

## 2019-04-28 MED ORDER — LACTATED RINGERS IV SOLN: INTRAVENOUS | Status: DC | PRN

## 2019-04-28 MED ORDER — DEXAMETHASONE SODIUM PHOSPHATE 10 MG/ML IJ SOLN
INTRAMUSCULAR | Status: DC | PRN
  Administered 2019-04-28: 10 mg via INTRAVENOUS

## 2019-04-28 MED ORDER — ONDANSETRON HCL 4 MG/2ML IJ SOLN
INTRAMUSCULAR | Status: DC | PRN
  Administered 2019-04-28: 4 mg via INTRAVENOUS

## 2019-04-28 MED ORDER — METHYLENE BLUE 0.5 % INJ SOLN
INTRAVENOUS | Status: AC
  Filled 2019-04-28: qty 10

## 2019-04-28 MED ORDER — THROMBIN 20000 UNITS EX SOLR
CUTANEOUS | Status: AC
  Filled 2019-04-28: qty 20000

## 2019-04-28 MED ORDER — PROPOFOL 10 MG/ML IV BOLUS
INTRAVENOUS | Status: AC
  Filled 2019-04-28: qty 20

## 2019-04-28 MED ORDER — PROPOFOL 10 MG/ML IV BOLUS
INTRAVENOUS | Status: DC | PRN
  Administered 2019-04-28: 30 mg via INTRAVENOUS
  Administered 2019-04-28: 20 mg via INTRAVENOUS
  Administered 2019-04-28: 150 mg via INTRAVENOUS

## 2019-04-28 MED FILL — Heparin Sodium (Porcine) Inj 1000 Unit/ML: INTRAMUSCULAR | Qty: 30 | Status: AC

## 2019-04-28 MED FILL — Sodium Chloride IV Soln 0.9%: INTRAVENOUS | Qty: 1000 | Status: AC

## 2019-04-28 SURGICAL SUPPLY — 90 items
AGENT HMST KT MTR STRL THRMB (HEMOSTASIS)
APL SKNCLS STERI-STRIP NONHPOA (GAUZE/BANDAGES/DRESSINGS)
BENZOIN TINCTURE PRP APPL 2/3 (GAUZE/BANDAGES/DRESSINGS) ×1 IMPLANT
BLADE CLIPPER SURG (BLADE) IMPLANT
BONE VIVIGEN FORMABLE 1.3CC (Bone Implant) ×3 IMPLANT
BUR PRESCISION 1.7 ELITE (BURR) ×3 IMPLANT
BUR ROUND FLUTED 5 RND (BURR) ×2 IMPLANT
BUR ROUND FLUTED 5MM RND (BURR) ×1
BUR ROUND PRECISION 4.0 (BURR) IMPLANT
BUR ROUND PRECISION 4.0MM (BURR)
BUR SABER RD CUTTING 3.0 (BURR) IMPLANT
BUR SABER RD CUTTING 3.0MM (BURR)
CARTRIDGE OIL MAESTRO DRILL (MISCELLANEOUS) ×1 IMPLANT
CLOSURE WOUND 1/2 X4 (GAUZE/BANDAGES/DRESSINGS) ×1
CONT SPEC 4OZ CLIKSEAL STRL BL (MISCELLANEOUS) ×3 IMPLANT
COVER MAYO STAND STRL (DRAPES) ×6 IMPLANT
COVER SURGICAL LIGHT HANDLE (MISCELLANEOUS) ×3 IMPLANT
COVER WAND RF STERILE (DRAPES) ×1 IMPLANT
DIFFUSER DRILL AIR PNEUMATIC (MISCELLANEOUS) ×3 IMPLANT
DRAIN CHANNEL 15F RND FF W/TCR (WOUND CARE) IMPLANT
DRAPE C-ARM 42X72 X-RAY (DRAPES) ×3 IMPLANT
DRAPE C-ARMOR (DRAPES) ×2 IMPLANT
DRAPE POUCH INSTRU U-SHP 10X18 (DRAPES) ×3 IMPLANT
DRAPE SURG 17X23 STRL (DRAPES) ×12 IMPLANT
DURAPREP 26ML APPLICATOR (WOUND CARE) ×3 IMPLANT
ELECT BLADE 4.0 EZ CLEAN MEGAD (MISCELLANEOUS) ×3
ELECT CAUTERY BLADE 6.4 (BLADE) ×3 IMPLANT
ELECT REM PT RETURN 9FT ADLT (ELECTROSURGICAL) ×3
ELECTRODE BLDE 4.0 EZ CLN MEGD (MISCELLANEOUS) ×1 IMPLANT
ELECTRODE REM PT RTRN 9FT ADLT (ELECTROSURGICAL) ×1 IMPLANT
EVACUATOR SILICONE 100CC (DRAIN) IMPLANT
FILTER STRAW FLUID ASPIR (MISCELLANEOUS) ×3 IMPLANT
GAUZE 4X4 16PLY RFD (DISPOSABLE) ×3 IMPLANT
GAUZE SPONGE 4X4 12PLY STRL (GAUZE/BANDAGES/DRESSINGS) ×3 IMPLANT
GLOVE BIO SURGEON STRL SZ7 (GLOVE) ×5 IMPLANT
GLOVE BIO SURGEON STRL SZ8 (GLOVE) ×5 IMPLANT
GLOVE BIOGEL PI IND STRL 7.0 (GLOVE) ×1 IMPLANT
GLOVE BIOGEL PI IND STRL 8 (GLOVE) ×1 IMPLANT
GLOVE BIOGEL PI INDICATOR 7.0 (GLOVE) ×2
GLOVE BIOGEL PI INDICATOR 8 (GLOVE) ×2
GOWN STRL REUS W/ TWL LRG LVL3 (GOWN DISPOSABLE) ×2 IMPLANT
GOWN STRL REUS W/ TWL XL LVL3 (GOWN DISPOSABLE) ×1 IMPLANT
GOWN STRL REUS W/TWL LRG LVL3 (GOWN DISPOSABLE) ×6
GOWN STRL REUS W/TWL XL LVL3 (GOWN DISPOSABLE) ×3
GRAFT BNE MATRIX VG FRMBL SM 1 (Bone Implant) IMPLANT
GUIDEWIRE SHARP VIPER II (WIRE) ×8 IMPLANT
IV CATH 14GX2 1/4 (CATHETERS) ×3 IMPLANT
KIT ALARA NEURO ACCESS (KITS) ×8 IMPLANT
KIT BASIN OR (CUSTOM PROCEDURE TRAY) ×3 IMPLANT
KIT POSITION SURG JACKSON T1 (MISCELLANEOUS) ×3 IMPLANT
KIT TURNOVER KIT B (KITS) ×3 IMPLANT
MARKER SKIN DUAL TIP RULER LAB (MISCELLANEOUS) ×6 IMPLANT
NDL 18GX1X1/2 (RX/OR ONLY) (NEEDLE) ×1 IMPLANT
NDL HYPO 25GX1X1/2 BEV (NEEDLE) ×1 IMPLANT
NDL SPNL 18GX3.5 QUINCKE PK (NEEDLE) ×2 IMPLANT
NEEDLE 18GX1X1/2 (RX/OR ONLY) (NEEDLE) ×3 IMPLANT
NEEDLE 22X1 1/2 (OR ONLY) (NEEDLE) ×6 IMPLANT
NEEDLE HYPO 25GX1X1/2 BEV (NEEDLE) ×3 IMPLANT
NEEDLE SPNL 18GX3.5 QUINCKE PK (NEEDLE) ×6 IMPLANT
NS IRRIG 1000ML POUR BTL (IV SOLUTION) ×3 IMPLANT
OIL CARTRIDGE MAESTRO DRILL (MISCELLANEOUS) ×3
PACK LAMINECTOMY ORTHO (CUSTOM PROCEDURE TRAY) ×3 IMPLANT
PACK UNIVERSAL I (CUSTOM PROCEDURE TRAY) ×3 IMPLANT
PAD ARMBOARD 7.5X6 YLW CONV (MISCELLANEOUS) ×6 IMPLANT
PATTIES SURGICAL .5 X1 (DISPOSABLE) ×3 IMPLANT
PATTIES SURGICAL .5X1.5 (GAUZE/BANDAGES/DRESSINGS) ×3 IMPLANT
ROD VIPER II LORDOSED 5.5X40 (Rod) ×4 IMPLANT
SCREW SET SINGLE INNER MIS (Screw) ×8 IMPLANT
SCREW XTAB POLY VIPER  7X45 (Screw) ×8 IMPLANT
SCREW XTAB POLY VIPER 7X45 (Screw) IMPLANT
SPONGE INTESTINAL PEANUT (DISPOSABLE) ×3 IMPLANT
SPONGE SURGIFOAM ABS GEL 100 (HEMOSTASIS) ×3 IMPLANT
STRIP CLOSURE SKIN 1/2X4 (GAUZE/BANDAGES/DRESSINGS) ×3 IMPLANT
SURGIFLO W/THROMBIN 8M KIT (HEMOSTASIS) IMPLANT
SUT MNCRL AB 4-0 PS2 18 (SUTURE) ×3 IMPLANT
SUT VIC AB 0 CT1 18XCR BRD 8 (SUTURE) ×1 IMPLANT
SUT VIC AB 0 CT1 8-18 (SUTURE) ×3
SUT VIC AB 1 CT1 18XCR BRD 8 (SUTURE) ×1 IMPLANT
SUT VIC AB 1 CT1 8-18 (SUTURE) ×3
SUT VIC AB 2-0 CT2 18 VCP726D (SUTURE) ×3 IMPLANT
SYR 20ML LL LF (SYRINGE) ×6 IMPLANT
SYR BULB IRRIGATION 50ML (SYRINGE) ×3 IMPLANT
SYR CONTROL 10ML LL (SYRINGE) ×6 IMPLANT
SYR TB 1ML LUER SLIP (SYRINGE) ×3 IMPLANT
TAP CANN VIPER2 DL 6.0 (TAP) ×4 IMPLANT
TAP CANN VIPER2 DL 7.0 (TAP) ×2 IMPLANT
TAPE CLOTH SURG 4X10 WHT LF (GAUZE/BANDAGES/DRESSINGS) ×2 IMPLANT
TRAY FOLEY MTR SLVR 16FR STAT (SET/KITS/TRAYS/PACK) ×3 IMPLANT
WATER STERILE IRR 1000ML POUR (IV SOLUTION) ×3 IMPLANT
YANKAUER SUCT BULB TIP NO VENT (SUCTIONS) ×3 IMPLANT

## 2019-04-28 NOTE — H&P (Signed)
Patient presents for stage 2 of what was to be a two-stage procedure. She tolerated yesterday's surgery very well, and does report resolution of her preoperative right leg pain.  We will proceed today with an L4-5 posterior spinal fusion with instrumentation and allograft, as scheduled.  Other than this, there has been no interval change in her history since yesterday.

## 2019-04-28 NOTE — Anesthesia Preprocedure Evaluation (Addendum)
Anesthesia Evaluation  Patient identified by MRN, date of birth, ID band Patient awake    Reviewed: Allergy & Precautions, NPO status , Patient's Chart, lab work & pertinent test results  History of Anesthesia Complications Negative for: history of anesthetic complications  Airway Mallampati: II  TM Distance: >3 FB Neck ROM: Full    Dental  (+) Teeth Intact, Dental Advisory Given   Pulmonary sleep apnea , neg recent URI, former smoker,    breath sounds clear to auscultation       Cardiovascular hypertension, Pt. on medications  Rhythm:Regular     Neuro/Psych PSYCHIATRIC DISORDERS Depression  Neuromuscular disease    GI/Hepatic Neg liver ROS, hiatal hernia, GERD  ,  Endo/Other  Morbid obesity  Renal/GU negative Renal ROS     Musculoskeletal  (+) Arthritis ,   Abdominal   Peds  Hematology negative hematology ROS (+)   Anesthesia Other Findings   Reproductive/Obstetrics                            Anesthesia Physical Anesthesia Plan  ASA: III  Anesthesia Plan: General   Post-op Pain Management:    Induction: Intravenous  PONV Risk Score and Plan: 3 and Ondansetron and Dexamethasone  Airway Management Planned: Oral ETT  Additional Equipment: None  Intra-op Plan:   Post-operative Plan: Extubation in OR  Informed Consent: I have reviewed the patients History and Physical, chart, labs and discussed the procedure including the risks, benefits and alternatives for the proposed anesthesia with the patient or authorized representative who has indicated his/her understanding and acceptance.     Dental advisory given  Plan Discussed with: CRNA and Surgeon  Anesthesia Plan Comments:         Anesthesia Quick Evaluation

## 2019-04-28 NOTE — Anesthesia Procedure Notes (Signed)
Procedure Name: Intubation Date/Time: 04/28/2019 12:57 PM Performed by: Fransisca Kaufmann, CRNA Pre-anesthesia Checklist: Patient identified, Emergency Drugs available, Suction available and Patient being monitored Patient Re-evaluated:Patient Re-evaluated prior to induction Oxygen Delivery Method: Circle System Utilized Preoxygenation: Pre-oxygenation with 100% oxygen Induction Type: IV induction Ventilation: Mask ventilation without difficulty Grade View: Grade I Tube type: Oral Tube size: 7.5 mm Number of attempts: 1 Airway Equipment and Method: Stylet and Oral airway Placement Confirmation: ETT inserted through vocal cords under direct vision,  positive ETCO2 and breath sounds checked- equal and bilateral Secured at: 22 cm Tube secured with: Tape Dental Injury: Teeth and Oropharynx as per pre-operative assessment

## 2019-04-28 NOTE — Transfer of Care (Signed)
Immediate Anesthesia Transfer of Care Note  Patient: Burnetta Kohls  Procedure(s) Performed: POSTERIOR LUMBAR FUSION LUMBAR 4-5 WITH INSTRUMENTATION AND ALLOGRAFT (N/A )  Patient Location: PACU  Anesthesia Type:General  Level of Consciousness: drowsy and patient cooperative  Airway & Oxygen Therapy: Patient Spontanous Breathing and Patient connected to face mask oxygen  Post-op Assessment: Report given to RN and Post -op Vital signs reviewed and stable  Post vital signs: Reviewed and stable  Last Vitals:  Vitals Value Taken Time  BP 160/104 04/28/19 1539  Temp    Pulse 112 04/28/19 1539  Resp 27 04/28/19 1539  SpO2 90 % 04/28/19 1539  Vitals shown include unvalidated device data.  Last Pain:  Vitals:   04/28/19 1530  TempSrc:   PainSc: Asleep      Patients Stated Pain Goal: 3 (04/28/19 1005)  Complications: No apparent anesthesia complications

## 2019-04-28 NOTE — Op Note (Signed)
PATIENT NAME: [name]   MEDICAL RECORD NO.:   [id number]    DATE OF BIRTH: 05/07/2018   DATE OF PROCEDURE: 04/28/2019                               OPERATIVE REPORT     PREOPERATIVE DIAGNOSES: 1.  Status post L4-5 anterior lumbar fusion on 04/28/2019, requiring posterior fusion with instrumentation   POSTOPERATIVE DIAGNOSES:   1.  Status post L4-5 anterior lumbar fusion on 04/28/2019, requiring posterior fusion with instrumentation     PROCEDURE (Stage 2): 1. Posterior spinal fusion, L4-5 2. Placement of posterior segmental instrumentation, L4, L5, bilaterally. 3. Use of morselized allograft - Vivigen. 4. Intraoperative use of floroscopy   SURGEON:  Estill Bamberg, MD   ASSISTANT:  Jason Coop, PA-C   ANESTHESIA:  General endotracheal anesthesia.   COMPLICATIONS:  None.   DISPOSITION:  Stable.   ESTIMATED BLOOD LOSS:  Minimal   INDICATIONS FOR SURGERY: Briefly, Ms. Grant is one day status post an anterior lumbar fusion as noted above.  Please refer to my operative report dated 04/27/2019, for a full account of the patient's preoperative history and indications for surgery.  The patient did present today for stage 2 of what was to be a 2-staged procedure.   OPERATIVE DETAILS:  On 04/28/2019, the patient was brought to surgery and general endotracheal anesthesia was administered.  The patient was placed prone onto a Jackson spinal bed.  The back was then prepped and draped in the usual sterile fashion.  I then made paramedian incisions on the right and left sides, just lateral to the lateral borders of the pedicles at the L4-5 level.  On the left side, the posterolateral gutter and posterior elements, including the facet joint,  was identified and exposed and decorticated using a high-speed bur.  Vivigen was packed into the posterolateral gutter on the left side to aid in the success of the posterior fusion.  I then tapped the L4 and L5 pedicles bilaterally using a 6 mm tap.   Of note, I did use neurologic monitoring and I did test each of the taps using triggered EMG.  There was no tap that tested below 20 milliamps.  I then placed 7 x 45 mm screws bilaterally at L4 and L5.  40 mm rods were then secured into the tulip heads of the screws bilaterally.  Caps were then placed and a final locking procedure was performed.  I was very pleased with the final AP and lateral fluoroscopic images.  The wound was then copiously irrigated.  On the right and left sides, the fascia was closed using #1 Vicryl.  The subcutaneous layer was closed using 0 Vicryl followed by 2-0 Vicryl, and the skin was then closed using 4-0 Monocryl. Benzoin and Steri-Strips were applied followed by sterile dressing.  All instrument counts were correct at the termination of the procedure. Again, I did use neurologic monitoring throughout the surgery, and there was no abnormal EMG activity noted throughout the surgery.   Of note, Jason Coop was my assistant throughout surgery, and did aid in retraction, suctioning, placement of the hardware, and closure throughout the entire procedure.     Estill Bamberg, MD

## 2019-04-28 NOTE — Progress Notes (Signed)
   VASCULAR SURGERY ASSESSMENT & PLAN:   POD 1 -RETROPERITONEAL EXPOSURE OF L4-L5: The patient is doing well.  Her numbness in her leg is improved.  She has a palpable left dorsalis pedis pulse.  Vascular surgery will be available as needed.   SUBJECTIVE:   Her back feels better and the numbness in her leg has improved.  PHYSICAL EXAM:   Vitals:   04/27/19 1949 04/27/19 2323 04/28/19 0505 04/28/19 0740  BP: 123/62 (!) 106/57 120/64 126/65  Pulse: 96 95 (!) 103 98  Resp: 20 20 20 19   Temp: 98.3 F (36.8 C) 98.1 F (36.7 C) 98.2 F (36.8 C) 98.7 F (37.1 C)  TempSrc: Oral Oral Oral Oral  SpO2: 99% 97% 97% 97%  Weight:      Height:       Palpable left dorsalis pedis pulse.  LABS:   Lab Results  Component Value Date   WBC 10.6 (H) 04/23/2019   HGB 14.0 04/23/2019   HCT 42.5 04/23/2019   MCV 93.8 04/23/2019   PLT 290 04/23/2019   Lab Results  Component Value Date   CREATININE 0.58 04/23/2019   Lab Results  Component Value Date   INR 1.0 04/23/2019    PROBLEM LIST:    Active Problems:   Radiculopathy   CURRENT MEDS:   . Chlorhexidine Gluconate Cloth  6 each Topical Daily  . docusate sodium  100 mg Oral BID  . feeding supplement  296 mL Oral Once  . gabapentin  600 mg Oral TID  . pantoprazole  40 mg Oral Daily  . sodium chloride flush  3 mL Intravenous Q12H  . spironolactone  25 mg Oral Daily    04/25/2019 Office: [phone] 04/28/2019

## 2019-04-29 ENCOUNTER — Encounter: Payer: Self-pay | Admitting: *Deleted

## 2019-04-29 MED ORDER — METHOCARBAMOL 500 MG PO TABS: 500.0000 mg | ORAL_TABLET | Freq: Four times a day (QID) | ORAL | 0 refills | Status: DC | PRN

## 2019-04-29 MED ORDER — OXYCODONE-ACETAMINOPHEN 5-325 MG PO TABS: 1.0000 | ORAL_TABLET | Freq: Four times a day (QID) | ORAL | 0 refills | Status: DC | PRN

## 2019-04-29 NOTE — Evaluation (Signed)
Occupational Therapy Evaluation Patient Details Name: [name] MRN: [id number] DOB: 07-23-1970 Today's Date: 04/29/2019    History of Present Illness 49 yo female s/p anterior/ posterir L4-5 fusion PMH:  DDD arthritis, sleep apnea, depression,hernia repair    Clinical Impression   Patient evaluated by Occupational Therapy with no further acute OT needs identified. All education has been completed and the patient has no further questions. See below for any follow-up Occupational Therapy or equipment needs. OT to sign off. Thank you for referral.      Follow Up Recommendations  No OT follow up    Equipment Recommendations  3 in 1 bedside commode(bariatric)    Recommendations for Other Services       Precautions / Restrictions Precautions Precautions: Back Precaution Comments: issued sex after back handout, 3n1 in tub transfer, and back precautions with adls Required Braces or Orthoses: Spinal Brace Spinal Brace: Thoracolumbosacral orthotic;Applied in sitting position      Mobility Bed Mobility               General bed mobility comments: oob on arrival with PT  Transfers Overall transfer level: Needs assistance   Transfers: Sit to/from Stand Sit to Stand: Min guard         General transfer comment: requires bil UE to push off surface    Balance Overall balance assessment: Mild deficits observed, not formally tested                                         ADL either performed or assessed with clinical judgement   ADL Overall ADL's : Needs assistance/impaired Eating/Feeding: Independent   Grooming: Wash/dry hands;Wash/dry face;Modified independent           Upper Body Dressing : Modified independent Upper Body Dressing Details (indicate cue type and reason): able to don doff brace Lower Body Dressing: Supervision/safety;Adhering to back precautions;With adaptive equipment Lower Body Dressing Details (indicate cue type and reason):  required mod cues for using AE to dress Toilet Transfer: Min guard           Functional mobility during ADLs: Min guard General ADL Comments: all questions and education complete including sex back handout  Back handout provided and reviewed adls in detail. Pt educated on: clothing between brace, never sleep in brace, set an alarm at night for medication, avoid sitting for long periods of time, correct bed positioning for sleeping, correct sequence for bed mobility, avoiding lifting more than 5 pounds and never wash directly over incision. All education is complete and patient indicates understanding.     Vision Patient Visual Report: No change from baseline       Perception     Praxis      Pertinent Vitals/Pain Pain Assessment: Faces Faces Pain Scale: Hurts a little bit Pain Location: back Pain Descriptors / Indicators: Operative site guarding Pain Intervention(s): Monitored during session;Premedicated before session;Repositioned     Hand Dominance Right   Extremity/Trunk Assessment Upper Extremity Assessment Upper Extremity Assessment: Overall WFL for tasks assessed   Lower Extremity Assessment Lower Extremity Assessment: Defer to PT evaluation   Cervical / Trunk Assessment Cervical / Trunk Assessment: Other exceptions(s/p surg)   Communication Communication Communication: No difficulties   Cognition Arousal/Alertness: Awake/alert Behavior During Therapy: WFL for tasks assessed/performed Overall Cognitive Status: Within Functional Limits for tasks assessed  General Comments  dressing intact and dry    Exercises     Shoulder Instructions      Home Living Family/patient expects to be discharged to:: Private residence Living Arrangements: Spouse/significant other;Children Available Help at Discharge: Family;Friend(s);Available PRN/intermittently Type of Home: House Home Access: Stairs to enter Entrance  Stairs-Number of Steps: 1   Home Layout: Two level;Able to live on main level with bedroom/bathroom;Full bath on main level     Bathroom Shower/Tub: Chief Strategy Officer: Standard     Home Equipment: Mudlogger: Reacher Additional Comments: spouse and sister to (A)       Prior Functioning/Environment Level of Independence: Independent        Comments: reports pain limiting mobility prior        OT Problem List:        OT Treatment/Interventions:      OT Goals(Current goals can be found in the care plan section) Acute Rehab OT Goals Patient Stated Goal: to return home today OT Goal Formulation: With patient  OT Frequency:     Barriers to D/C:            Co-evaluation              AM-PAC OT "6 Clicks" Daily Activity     Outcome Measure Help from another person eating meals?: None Help from another person taking care of personal grooming?: None Help from another person toileting, which includes using toliet, bedpan, or urinal?: A Little Help from another person bathing (including washing, rinsing, drying)?: A Little Help from another person to put on and taking off regular upper body clothing?: None Help from another person to put on and taking off regular lower body clothing?: A Little 6 Click Score: 21   End of Session Equipment Utilized During Treatment: Back brace Nurse Communication: Mobility status;Precautions  Activity Tolerance: Patient tolerated treatment well Patient left: in chair;with call bell/phone within reach  OT Visit Diagnosis: Unsteadiness on feet (R26.81)                Time: 0830-0900 OT Time Calculation (min): 30 min Charges:  OT General Charges $OT Visit: 1 Visit OT Evaluation $OT Eval Moderate Complexity: 1 Mod OT Treatments $Self Care/Home Management : 8-22 mins   Brynn, OTR/L  Acute Rehabilitation Services Pager: [phone] Office: [phone] .   Mateo Flow 04/29/2019,  9:17 AM

## 2019-04-29 NOTE — Evaluation (Signed)
Physical Therapy Evaluation Patient Details Name: [name] MRN: [id number] DOB: [birth year] Today's Date: 04/29/2019   History of Present Illness  49 yo female s/p anterior/ posterir L4-5 fusion PMH:  DDD arthritis, sleep apnea, depression,hernia repair   Clinical Impression  Pt admitted with above diagnosis. At the time of PT eval, pt was able to demonstrate transfers and ambulation with min guard assist to supervision for safety and no AD. Pt was educated on precautions, brace application/wearing schedule, appropriate activity progression, and car transfer. Pt currently with functional limitations due to the deficits listed below (see PT Problem List). Pt will benefit from skilled PT to increase their independence and safety with mobility to allow discharge to the venue listed below.      Follow Up Recommendations No PT follow up;Supervision - Intermittent    Equipment Recommendations  None recommended by PT    Recommendations for Other Services       Precautions / Restrictions Precautions Precautions: Back Precaution Booklet Issued: Yes (comment) Precaution Comments: Reviewed handout in detail and pt was cued for precautions during functional mobility.  Required Braces or Orthoses: Spinal Brace Spinal Brace: Thoracolumbosacral orthotic;Applied in sitting position Restrictions Weight Bearing Restrictions: No      Mobility  Bed Mobility Overal bed mobility: Needs Assistance Bed Mobility: Rolling;Sidelying to Sit Rolling: Modified independent (Device/Increase time) Sidelying to sit: Supervision       General bed mobility comments: HOB flat and rails lowered to simulate home environment. No assist required. Light cues for log roll technique.   Transfers Overall transfer level: Needs assistance Equipment used: None Transfers: Sit to/from Stand Sit to Stand: Min guard         General transfer comment: requires bil UE to push off  surface  Ambulation/Gait Ambulation/Gait assistance: Min guard;Supervision Gait Distance (Feet): 250 Feet Assistive device: None Gait Pattern/deviations: Step-through pattern;Decreased stride length;Trunk flexed Gait velocity: Decreased Gait velocity interpretation: <1.8 ft/sec, indicate of risk for recurrent falls General Gait Details: Slow and guarded with increased lateral sway noted. No unsteadiness or LOB noted. Progressed from min guard assist to supervision for safety by end of gait training.   Stairs            Wheelchair Mobility    Modified Rankin (Stroke Patients Only)       Balance Overall balance assessment: Mild deficits observed, not formally tested                                           Pertinent Vitals/Pain Pain Assessment: Faces Faces Pain Scale: Hurts little more Pain Location: back Pain Descriptors / Indicators: Operative site guarding Pain Intervention(s): Limited activity within patient's tolerance;Monitored during session;Repositioned    Home Living Family/patient expects to be discharged to:: Private residence Living Arrangements: Spouse/significant other;Children Available Help at Discharge: Family;Friend(s);Available PRN/intermittently Type of Home: House Home Access: Stairs to enter   Entrance Stairs-Number of Steps: 1 Home Layout: Two level;Able to live on main level with bedroom/bathroom;Full bath on main level Home Equipment: Adaptive equipment Additional Comments: spouse and sister to (A)     Prior Function Level of Independence: Independent         Comments: reports pain limiting mobility prior     Hand Dominance   Dominant Hand: Right    Extremity/Trunk Assessment   Upper Extremity Assessment Upper Extremity Assessment: Defer to OT evaluation    Lower Extremity  Assessment Lower Extremity Assessment: Generalized weakness(Consistent with pre-op diagnosis)    Cervical / Trunk  Assessment Cervical / Trunk Assessment: Other exceptions(s/p surg)  Communication   Communication: No difficulties  Cognition Arousal/Alertness: Awake/alert Behavior During Therapy: WFL for tasks assessed/performed Overall Cognitive Status: Within Functional Limits for tasks assessed                                        General Comments General comments (skin integrity, edema, etc.): dressing intact and dry    Exercises     Assessment/Plan    PT Assessment Patient needs continued PT services  PT Problem List Decreased strength;Decreased balance;Decreased activity tolerance;Decreased mobility;Decreased knowledge of use of DME;Decreased safety awareness;Decreased knowledge of precautions;Pain       PT Treatment Interventions Gait training;DME instruction;Functional mobility training;Therapeutic activities;Therapeutic exercise;Neuromuscular re-education;Patient/family education    PT Goals (Current goals can be found in the Care Plan section)  Acute Rehab PT Goals Patient Stated Goal: to return home today PT Goal Formulation: With patient Time For Goal Achievement: 05/06/19 Potential to Achieve Goals: Good    Frequency Min 5X/week   Barriers to discharge        Co-evaluation               AM-PAC PT "6 Clicks" Mobility  Outcome Measure Help needed turning from your back to your side while in a flat bed without using bedrails?: None Help needed moving from lying on your back to sitting on the side of a flat bed without using bedrails?: None Help needed moving to and from a bed to a chair (including a wheelchair)?: A Little Help needed standing up from a chair using your arms (e.g., wheelchair or bedside chair)?: A Little Help needed to walk in hospital room?: A Little Help needed climbing 3-5 steps with a railing? : A Little 6 Click Score: 20    End of Session Equipment Utilized During Treatment: Gait belt;Back brace Activity Tolerance: Patient  tolerated treatment well Patient left: Other (comment)(EOB with OT in room. ) Nurse Communication: Mobility status PT Visit Diagnosis: Unsteadiness on feet (R26.81);Pain Pain - part of body: (back)    Time: 1749-4496 PT Time Calculation (min) (ACUTE ONLY): 19 min   Charges:   PT Evaluation $PT Eval Moderate Complexity: 1 Mod          Rolinda Roan, PT, DPT Acute Rehabilitation Services Pager: [phone] Office: [phone]   Thelma Comp 04/29/2019, 11:33 AM

## 2019-04-29 NOTE — Plan of Care (Signed)
Pt doing well. Pt given D/C instructions with verbal understanding. Rx's were sent to pharmacy by MD. Pt's incisions are clean and dry with no sign of infection. Pt's IV was removed prior to D/C. Pt received 3-n-1 from [facility] per MD order. Pt D/C'd home via wheelchair per MD order. Pt is stable @ D/C and has no other needs at this time. Rema Fendt, RN

## 2019-04-29 NOTE — Progress Notes (Signed)
    Patient doing well PO Day 1 and 2 after staged ANT/POST L4-5 fusion. Resolved radiculopathy symptoms and expected PO LBP described as "soreness" pt has been up and walking and tolerating PO's without difficulty. NL B/B function, she is pleased with her progress.   Physical Exam: BP (!) 143/85 (BP Location: Right Arm)   Pulse 98   Temp 97.8 F (36.6 C)   Resp 19   Ht 5' 7.01" (1.702 m)   Wt (!) 138.8 kg   LMP 04/17/2019 (Approximate) Comment: neg preg test today 04/27/2019  SpO2 100%   BMI 47.92 kg/m    Resting in bed comfortably, Dressing in place, CDI ANT and POST NVI  POD #1 and 2 s/p staged L4-5 ANT/POST fusion with resolved leg pain and minimal well controlled back pain   - up with PT/OT, encourage ambulation  -D/C home after PT/OT - Percocet for pain, Robaxin for muscle spasms  -Scripts sent to pharm electronically - likely d/c home today with f/u in 2 weeks  -D/C packet and appt card in chart printed

## 2019-05-05 NOTE — Anesthesia Postprocedure Evaluation (Signed)
Anesthesia Post Note  Patient: Naval architect  Procedure(s) Performed: POSTERIOR LUMBAR FUSION LUMBAR 4-5 WITH INSTRUMENTATION AND ALLOGRAFT (N/A )     Patient location during evaluation: PACU Anesthesia Type: General Level of consciousness: awake and alert Pain management: pain level controlled Vital Signs Assessment: post-procedure vital signs reviewed and stable Respiratory status: spontaneous breathing, nonlabored ventilation, respiratory function stable and patient connected to nasal cannula oxygen Cardiovascular status: blood pressure returned to baseline and stable Postop Assessment: no apparent nausea or vomiting Anesthetic complications: no    Last Vitals:  Vitals:   04/29/19 0812 04/29/19 1318  BP: (!) 143/85 130/74  Pulse: 98 96  Resp: 19 19  Temp: 36.6 C 36.9 C  SpO2: 100% 99%    Last Pain:  Vitals:   04/29/19 1345  TempSrc:   PainSc: 5                  Manuelito Poage

## 2019-05-07 NOTE — Discharge Summary (Signed)
Patient ID: [name] MRN: [id number] DOB/AGE: 04/10/1970 49 y.o.  Admit date: 04/27/2019 Discharge date: 04/29/2019  Admission Diagnoses:  Active Problems:   Radiculopathy   Discharge Diagnoses:  Same  Past Medical History:  Diagnosis Date  . Anemia   . Arthritis   . Depression    due to perimenopausal phase per pt.  Marland Kitchen GERD (gastroesophageal reflux disease)   . History of hiatal hernia   . Postpartum care following cesarean delivery (4/27) 07/28/2015  . Pregnancy induced hypertension    during pregnancy  . Sleep apnea    no machine  . Varicose veins   . Vitamin D deficiency     Surgeries: Procedure(s): POSTERIOR LUMBAR FUSION LUMBAR 4-5 WITH INSTRUMENTATION AND ALLOGRAFT on 04/28/2019   Consultants: None  Discharged Condition: Improved  Hospital Course: [name] is an 49 y.o. female who was admitted 04/27/2019 for operative treatment of radiculopathy. Patient has severe unremitting pain that affects sleep, daily activities, and work/hobbies. After pre-op clearance the patient was taken to the operating room on 04/28/2019 and underwent  Procedure(s): POSTERIOR LUMBAR FUSION LUMBAR 4-5 WITH INSTRUMENTATION AND ALLOGRAFT.    Patient was given perioperative antibiotics:  Anti-infectives (From admission, onward)   Start     Dose/Rate Route Frequency Ordered Stop   04/28/19 1236  ceFAZolin (ANCEF) 2-4 GM/100ML-% IVPB    Note to Pharmacy: Ebbie Latus   : cabinet override      04/28/19 1236 04/29/19 0044   04/28/19 1235  ceFAZolin (ANCEF) 1-4 GM/50ML-% IVPB    Note to Pharmacy: Ebbie Latus   : cabinet override      04/28/19 1235 04/29/19 0044   04/27/19 1530  ceFAZolin (ANCEF) IVPB 2g/100 mL premix     2 g 200 mL/hr over 30 Minutes Intravenous Every 8 hours 04/27/19 1310 04/28/19 0012   04/27/19 0600  ceFAZolin (ANCEF) 3 g in dextrose 5 % 50 mL IVPB     3 g 100 mL/hr over 30 Minutes Intravenous On call to O.R. 04/24/19 0732 04/27/19 1350       Patient was given  sequential compression devices, early ambulation to prevent DVT.  Patient benefited maximally from hospital stay and there were no complications.    Recent vital signs: BP 130/74 (BP Location: Right Arm)   Pulse 96   Temp 98.4 F (36.9 C) (Oral)   Resp 19   Ht 5' 7.01" (1.702 m)   Wt (!) 138.8 kg   LMP 04/17/2019 (Approximate) Comment: neg preg test today 04/27/2019  SpO2 99%   BMI 47.92 kg/m    Discharge Medications:   Allergies as of 04/29/2019      Reactions   Ibuprofen Anaphylaxis   Tramadol Nausea And Vomiting      Medication List    STOP taking these medications   HYDROcodone-acetaminophen 5-325 MG tablet Commonly known as: NORCO/VICODIN     TAKE these medications   ALAYCEN 1/35 tablet Generic drug: norethindrone-ethinyl estradiol 1/35 Take 1 tablet by mouth daily.   cetirizine 10 MG tablet Commonly known as: ZYRTEC Take 10 mg by mouth daily as needed for allergies (alternating with Allegra).   cyclobenzaprine 5 MG tablet Commonly known as: FLEXERIL Take 5-10 mg by mouth at bedtime.   fexofenadine 60 MG tablet Commonly known as: ALLEGRA Take 60 mg by mouth daily as needed for allergies or rhinitis (alternating with Zyrtec).   gabapentin 600 MG tablet Commonly known as: NEURONTIN Take 600 mg by mouth 3 (three) times daily.  methocarbamol 500 MG tablet Commonly known as: ROBAXIN Take 1 tablet (500 mg total) by mouth every 6 (six) hours as needed for muscle spasms.   oxyCODONE-acetaminophen 5-325 MG tablet Commonly known as: PERCOCET/ROXICET Take 1-2 tablets by mouth every 6 (six) hours as needed for moderate pain or severe pain.   pantoprazole 40 MG tablet Commonly known as: Protonix Take 1 tablet (40 mg total) by mouth daily.   spironolactone 25 MG tablet Commonly known as: ALDACTONE Take 25 mg by mouth daily.       Diagnostic Studies: DG Lumbar Spine 2-3 Views  Result Date: 04/28/2019 CLINICAL DATA:  Intraoperative imaging for L4-5  laminectomy and fusion. EXAM: DG C-ARM 1-60 MIN; LUMBAR SPINE - 2-3 VIEW COMPARISON:  None. FINDINGS: Two fluoroscopic intraoperative spot views of the lower lumbar spine demonstrate an anterior screw, bilateral pedicle screws, stabilization bars and interbody spacer in place at L4-5. Hardware is intact. No acute finding is identified. IMPRESSION: Intraoperative imaging for L4-5 fusion.  No acute finding. Electronically Signed   By: Drusilla Kanner M.D.   On: 04/28/2019 15:32   DG Lumbar Spine 2-3 Views  Result Date: 04/27/2019 CLINICAL DATA:  Anterior fusion L4-5. EXAM: DG C-ARM 1-60 MIN; LUMBAR SPINE - 2-3 VIEW COMPARISON:  Lumbar spine MRI 01/14/2019 FINDINGS: Anterior and interbody fusion changes are noted at L4-5. Stable mild anterolisthesis of L4. IMPRESSION: Anterior and interbody fusion changes at L4-5. Anterior and interbody fusion changes at L4-5. Electronically Signed   By: Rudie Meyer M.D.   On: 04/27/2019 11:43   DG C-Arm 1-60 Min  Result Date: 04/28/2019 CLINICAL DATA:  Intraoperative imaging for L4-5 laminectomy and fusion. EXAM: DG C-ARM 1-60 MIN; LUMBAR SPINE - 2-3 VIEW COMPARISON:  None. FINDINGS: Two fluoroscopic intraoperative spot views of the lower lumbar spine demonstrate an anterior screw, bilateral pedicle screws, stabilization bars and interbody spacer in place at L4-5. Hardware is intact. No acute finding is identified. IMPRESSION: Intraoperative imaging for L4-5 fusion.  No acute finding. Electronically Signed   By: Drusilla Kanner M.D.   On: 04/28/2019 15:32   DG C-Arm 1-60 Min  Result Date: 04/27/2019 CLINICAL DATA:  Anterior fusion L4-5. EXAM: DG C-ARM 1-60 MIN; LUMBAR SPINE - 2-3 VIEW COMPARISON:  Lumbar spine MRI 01/14/2019 FINDINGS: Anterior and interbody fusion changes are noted at L4-5. Stable mild anterolisthesis of L4. IMPRESSION: Anterior and interbody fusion changes at L4-5. Anterior and interbody fusion changes at L4-5. Electronically Signed   By: Rudie Meyer M.D.   On: 04/27/2019 11:43   DG OR LOCAL ABDOMEN  Result Date: 04/27/2019 CLINICAL DATA:  Lumbar fusion. Final instrument count verification. EXAM: OR LOCAL ABDOMEN COMPARISON:  Intraoperative radiographs, same date. FINDINGS: Anterior interbody fusion device at L4-5. No unexpected radiopaque foreign bodies are identified. IMPRESSION: No unexpected radiopaque foreign bodies. Electronically Signed   By: Rudie Meyer M.D.   On: 04/27/2019 11:21    Disposition: Discharge disposition: 01-Home or Self Care       Discharge Instructions    Discharge patient   Complete by: As directed    Home after PT/OT clearance   Discharge disposition: 01-Home or Self Care   Discharge patient date: 04/29/2019     . POD #1 and 2 s/p staged L4-5 ANT/POST fusion with resolved leg pain and minimal well controlled back pain   - up with PT/OT, encourage ambulation             -D/C home after PT/OT - Percocet for pain, Robaxin for  muscle spasms             -Scripts sent to pharm electronically - likely d/c home today with f/u in 2 weeks             -D/C packet and appt card in chart printed   Signed: Eilene Ghazi Esta Carmon 05/07/2019, 11:19 AM

## 2021-06-08 IMAGING — CT CT L SPINE W/O CM
1 of 6 series · 6 of 14 positions shown, 8 images · non-contrast
Comparison: MRI same day. CT abdomen 12/26/2016. MRI 07/22/2016.

CLINICAL DATA: Low back pain with bilateral radiculopathy right
worse than left, worsening over the last several years, particularly
since September 2017.

EXAM:
CT LUMBAR SPINE WITHOUT CONTRAST
TECHNIQUE: Multidetector CT imaging of the lumbar spine was performed without
intravenous contrast administration. Multiplanar CT image
reconstructions were also generated.

[Series 3: l spine soft · axial · 0.38mm/px · z∈[-296,-131]mm · 6 of 79 slices shown, 8 images]
[im 12/79  soft-tissue]
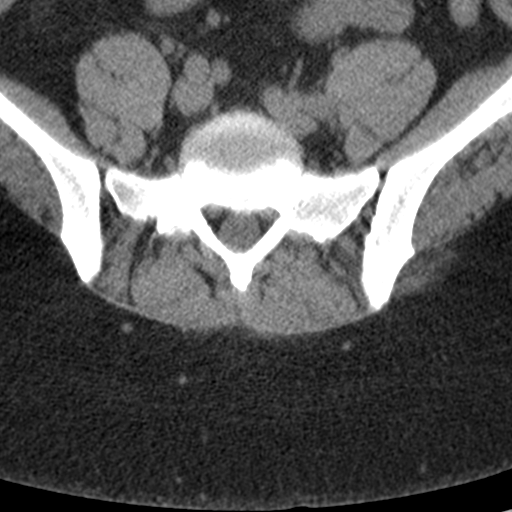
[im 12/79  bone]
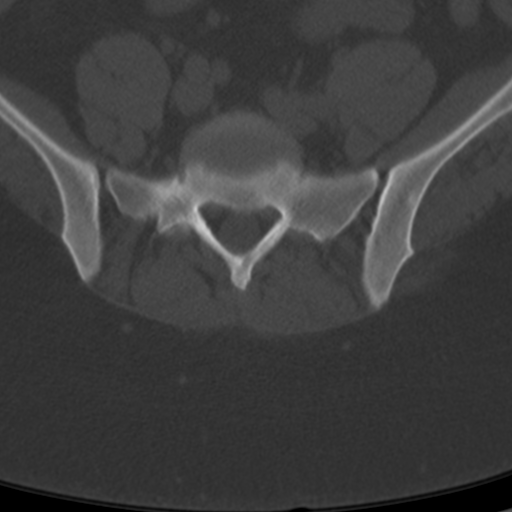
[im 23/79  bone]
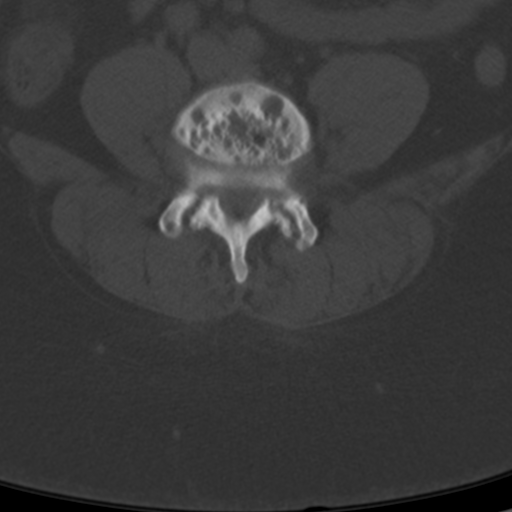
[im 34/79  bone]
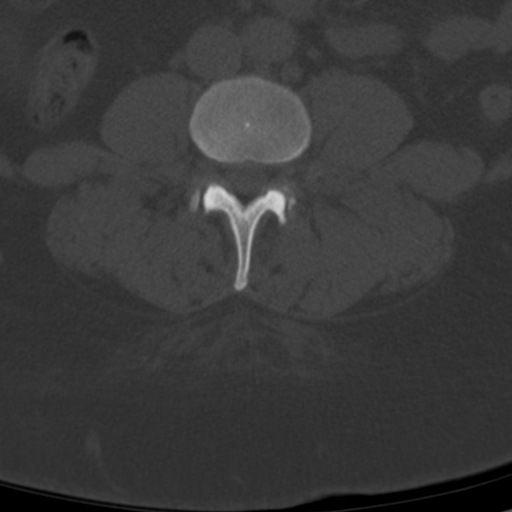
[im 45/79  bone]
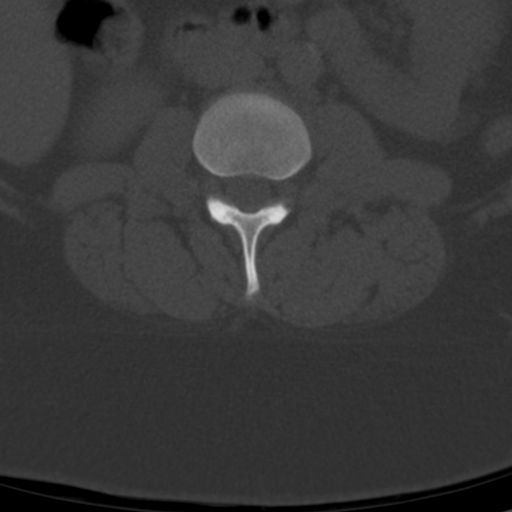
[im 56/79  soft-tissue]
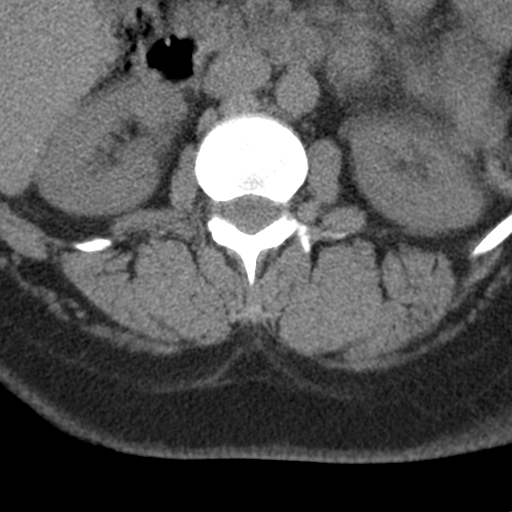
[im 56/79  bone]
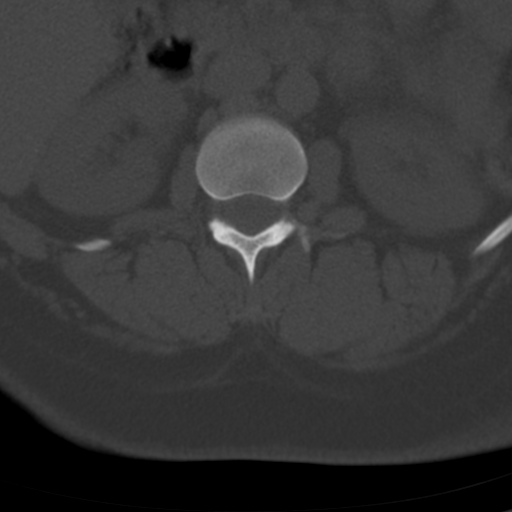
[im 67/79  bone]
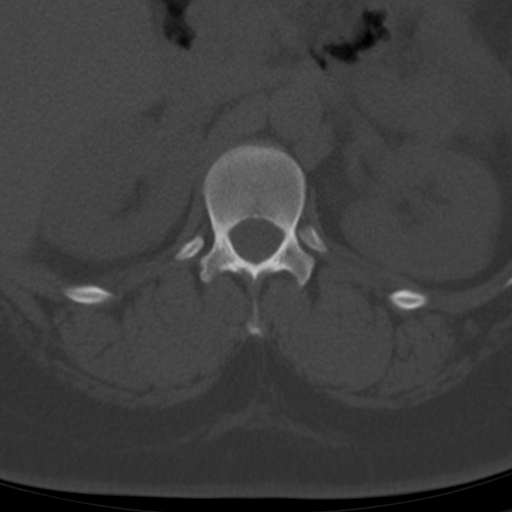

[6 of 14 positions shown; findings below may reference images not displayed]

FINDINGS: Segmentation: 5 lumbar type vertebral bodies.

Alignment: 9 mm of degenerative anterolisthesis L4-5.

Vertebrae: Pronounced discogenic endplate changes at L4-5 as
described below.

Paraspinal and other soft tissues: Negative except for early aortic
atherosclerosis.

Disc levels: No abnormality at L2-3 or above.

L3-4: Mild bulging of the disc. Bilateral facet osteoarthritis. No
apparent compressive stenosis.

L4-5: Advanced chronic facet arthropathy with anterolisthesis of 9
mm. Chronic disc degeneration with loss of disc height, vacuum
phenomenon and cystic sclerotic changes of the L4 and L5 endplates.
Severe stenosis of the spinal canal and neural foramina likely to
cause neural compression on both sides.

L5-S1: Mild bulging of the disc. Mild facet osteoarthritis. No
apparent compressive stenosis.
IMPRESSION: 1. L4-5: Advanced chronic facet arthropathy with anterolisthesis of
9 mm. Chronic disc degeneration with loss of disc height, vacuum
phenomenon and cystic sclerotic changes of the endplates. Severe
stenosis of the spinal canal and neural foramina likely to cause
neural compression on both sides. These findings have worsened
considerably since [DATE]. L3-4 and L5-S1: Mild bulging of the disc. Bilateral facet
osteoarthritis. No stenosis. Worsening since [DATE].

## 2021-06-19 ENCOUNTER — Encounter: Payer: Self-pay | Admitting: Family Medicine

## 2021-06-19 ENCOUNTER — Ambulatory Visit (INDEPENDENT_AMBULATORY_CARE_PROVIDER_SITE_OTHER): Payer: BC Managed Care – PPO | Admitting: Family Medicine

## 2021-06-19 ENCOUNTER — Other Ambulatory Visit: Payer: Self-pay

## 2021-06-19 VITALS — BP 147/95 | HR 68 | Temp 97.3°F | Resp 16 | Ht 67.0 in | Wt 279.4 lb

## 2021-06-19 DIAGNOSIS — F32A Depression, unspecified: Secondary | ICD-10-CM

## 2021-06-19 DIAGNOSIS — I10 Essential (primary) hypertension: Secondary | ICD-10-CM | POA: Diagnosis not present

## 2021-06-19 DIAGNOSIS — K219 Gastro-esophageal reflux disease without esophagitis: Secondary | ICD-10-CM | POA: Diagnosis not present

## 2021-06-19 DIAGNOSIS — Z7689 Persons encountering health services in other specified circumstances: Secondary | ICD-10-CM

## 2021-06-19 DIAGNOSIS — E66813 Obesity, class 3: Secondary | ICD-10-CM

## 2021-06-19 DIAGNOSIS — Z6841 Body Mass Index (BMI) 40.0 and over, adult: Secondary | ICD-10-CM

## 2021-06-19 MED ORDER — FLUOXETINE HCL 10 MG PO TABS: 10.0000 mg | ORAL_TABLET | Freq: Every day | ORAL | 0 refills | Status: DC

## 2021-06-19 MED ORDER — HYDROCHLOROTHIAZIDE 25 MG PO TABS: 25.0000 mg | ORAL_TABLET | Freq: Every day | ORAL | 0 refills | Status: DC

## 2021-06-19 MED ORDER — PHENTERMINE HCL 37.5 MG PO CAPS: 37.5000 mg | ORAL_CAPSULE | ORAL | 0 refills | Status: DC

## 2021-06-19 MED ORDER — PANTOPRAZOLE SODIUM 40 MG PO TBEC: 40.0000 mg | DELAYED_RELEASE_TABLET | Freq: Every day | ORAL | 0 refills | Status: DC

## 2021-06-19 MED ORDER — MELOXICAM 15 MG PO TABS: 15.0000 mg | ORAL_TABLET | Freq: Every day | ORAL | 0 refills | Status: DC

## 2021-06-19 MED ORDER — SPIRONOLACTONE 25 MG PO TABS: 25.0000 mg | ORAL_TABLET | Freq: Every day | ORAL | 0 refills | Status: DC

## 2021-06-19 NOTE — Progress Notes (Signed)
Patient is here to est care. Patient would like to talk blood pressure and sweet craving .  Patient would also like some type of water pill ?

## 2021-06-19 NOTE — Progress Notes (Signed)
? ?New Patient Office Visit ? ?Subjective:  ?Patient ID: [name], female    DOB: December 25, 1970  Age: 51 y.o. MRN: [id number] ? ?CC:  ?Chief Complaint  ?Patient presents with  ? Establish Care  ? ? ?HPI ?[name] presents for to establish care and for concerns regarding her blood pressure. She is presently taking one agent but continues to register elevated readings. She reports increased social stressors. She is also trying to lose weight so that she can have knee surgery.  ? ?Past Medical History:  ?Diagnosis Date  ? Anemia   ? Arthritis   ? Depression   ? due to perimenopausal phase per pt.  ? GERD (gastroesophageal reflux disease)   ? History of hiatal hernia   ? Postpartum care following cesarean delivery (4/27) 07/28/2015  ? Pregnancy induced hypertension   ? during pregnancy  ? Sleep apnea   ? no machine  ? Varicose veins   ? Vitamin D deficiency   ? ? ?Past Surgical History:  ?Procedure Laterality Date  ? ABDOMINAL EXPOSURE N/A 04/27/2019  ? Procedure: ABDOMINAL EXPOSURE;  Surgeon: Angelia Mould, MD;  Location: [facility];  Service: Vascular;  Laterality: N/A;  ? ANTERIOR LUMBAR FUSION N/A 04/27/2019  ? Procedure: ANTERIOR LUMBAR INTERBODY FUSION LUMBAR FOUR-FIVE WITH INSTRUMENTATION AND ALLOGRAFT;  Surgeon: Phylliss Bob, MD;  Location: [place];  Service: Orthopedics;  Laterality: N/A;  ? CESAREAN SECTION N/A 07/28/2015  ? Procedure: CESAREAN SECTION;  Surgeon: Brien Few, MD;  Location: [facility];  Service: Obstetrics;  Laterality: N/A;  ? ESOPHAGEAL MANOMETRY N/A 01/02/2017  ? Procedure: ESOPHAGEAL MANOMETRY (EM);  Surgeon: Ronnette Juniper, MD;  Location: [facility];  Service: Gastroenterology;  Laterality: N/A;  ? HERNIA REPAIR    ? hiatal hernia repair 03-06-17  ? INSERTION OF MESH N/A 03/06/2017  ? Procedure: INSERTION OF MESH;  Surgeon: Ralene Ok, MD;  Location: [facility];  Service: General;  Laterality: N/A;  ? TOE SURGERY Bilateral   ? ? ?Family History  ?Problem Relation Age of Onset  ? Heart  disease Mother   ? Diabetes Mother   ? Hypertension Mother   ? Cancer Father   ? Hypertension Father   ? Asthma Sister   ? Seizures Sister   ? Bipolar disorder Sister   ? ? ?Social History  ? ?Socioeconomic History  ? Marital status: Married  ?  Spouse name: Not on file  ? Number of children: Not on file  ? Years of education: Not on file  ? Highest education level: Not on file  ?Occupational History  ? Not on file  ?Tobacco Use  ? Smoking status: Former  ?  Types: Cigarettes  ?  Quit date: 03/06/2011  ?  Years since quitting: 10.2  ? Smokeless tobacco: Never  ?Vaping Use  ? Vaping Use: Never used  ?Substance and Sexual Activity  ? Alcohol use: No  ?  Alcohol/week: 0.0 standard drinks  ? Drug use: No  ? Sexual activity: Yes  ?  Birth control/protection: Pill  ?Other Topics Concern  ? Not on file  ?Social History Narrative  ? ** Merged History Encounter **  ?    ? ?Social Determinants of Health  ? ?Financial Resource Strain: Not on file  ?Food Insecurity: Not on file  ?Transportation Needs: Not on file  ?Physical Activity: Not on file  ?Stress: Not on file  ?Social Connections: Not on file  ?Intimate Partner Violence: Not on file  ? ? ?ROS ?Review of Systems  ?  Psychiatric/Behavioral:  Positive for sleep disturbance. Negative for self-injury and suicidal ideas. The patient is not nervous/anxious.   ?All other systems reviewed and are negative. ? ?Objective:  ? ?Today's Vitals: BP (!) 147/95   Pulse 68   Temp (!) 97.3 ?F (36.3 ?C) (Oral)   Resp 16   Ht 5\' 7"  (1.702 m)   Wt 279 lb 6.4 oz (126.7 kg)   SpO2 96%   BMI 43.76 kg/m?  ? ?Physical Exam ?Vitals and nursing note reviewed.  ?Constitutional:   ?   General: She is not in acute distress. ?   Appearance: She is obese.  ?Cardiovascular:  ?   Rate and Rhythm: Normal rate and regular rhythm.  ?Pulmonary:  ?   Effort: Pulmonary effort is normal.  ?   Breath sounds: Normal breath sounds.  ?Abdominal:  ?   Palpations: Abdomen is soft.  ?   Tenderness: There is no  abdominal tenderness.  ?Neurological:  ?   General: No focal deficit present.  ?   Mental Status: She is alert and oriented to person, place, and time.  ? ? ?Assessment & Plan:  ? ?1. Essential hypertension ?Elevated readings. Will add HCTZ to regimen and monitor ? ?2. Class 3 severe obesity due to excess calories with serious comorbidity and body mass index (BMI) of 40.0 to 44.9 in adult [facility]) ?Discussed dietary and activity options. Phentermine prescribed. Goal is 4-6lbs/mo wt loss.  ? ?3. Depression, unspecified depression type ?Prozac 20 mg prescribed. monitor ? ? ?4. Gastroesophageal reflux disease without esophagitis ?Discussed dietary and activity options. Meds refilled ? ?5. Encounter to establish care ? ? ? ? ?Outpatient Encounter Medications as of 06/19/2021  ?Medication Sig  ? cetirizine (ZYRTEC) 10 MG tablet Take 10 mg by mouth daily as needed for allergies (alternating with Allegra).  ? fexofenadine (ALLEGRA) 60 MG tablet Take 60 mg by mouth daily as needed for allergies or rhinitis (alternating with Zyrtec).  ? FLUoxetine (PROZAC) 10 MG tablet Take 1 tablet (10 mg total) by mouth daily.  ? hydrochlorothiazide (HYDRODIURIL) 25 MG tablet Take 1 tablet (25 mg total) by mouth daily.  ? metFORMIN (GLUCOPHAGE) 1000 MG tablet Take 1,000 mg by mouth daily.  ? phentermine 37.5 MG capsule Take 1 capsule (37.5 mg total) by mouth every morning.  ? [DISCONTINUED] meloxicam (MOBIC) 15 MG tablet Take 15 mg by mouth daily.  ? [DISCONTINUED] pantoprazole (PROTONIX) 40 MG tablet Take 1 tablet (40 mg total) by mouth daily.  ? [DISCONTINUED] spironolactone (ALDACTONE) 25 MG tablet Take 25 mg by mouth daily.  ? meloxicam (MOBIC) 15 MG tablet Take 1 tablet (15 mg total) by mouth daily.  ? pantoprazole (PROTONIX) 40 MG tablet Take 1 tablet (40 mg total) by mouth daily.  ? spironolactone (ALDACTONE) 25 MG tablet Take 1 tablet (25 mg total) by mouth daily.  ? [DISCONTINUED] cyclobenzaprine (FLEXERIL) 5 MG tablet Take 5-10 mg by  mouth at bedtime.  ? [DISCONTINUED] gabapentin (NEURONTIN) 600 MG tablet Take 600 mg by mouth 3 (three) times daily.   ? [DISCONTINUED] methocarbamol (ROBAXIN) 500 MG tablet Take 1 tablet (500 mg total) by mouth every 6 (six) hours as needed for muscle spasms.  ? [DISCONTINUED] norethindrone-ethinyl estradiol 1/35 (ORTHO-NOVUM) tablet Take 1 tablet by mouth daily.  ? [DISCONTINUED] oxyCODONE-acetaminophen (PERCOCET/ROXICET) 5-325 MG tablet Take 1-2 tablets by mouth every 6 (six) hours as needed for moderate pain or severe pain.  ? ?No facility-administered encounter medications on file as of 06/19/2021.  ? ? ?  Follow-up: No follow-ups on file.  ? ?Becky Sax, MD ? ?

## 2021-06-20 ENCOUNTER — Encounter: Payer: Self-pay | Admitting: Family Medicine

## 2021-07-14 ENCOUNTER — Other Ambulatory Visit: Payer: Self-pay | Admitting: Family Medicine

## 2021-07-27 ENCOUNTER — Ambulatory Visit (INDEPENDENT_AMBULATORY_CARE_PROVIDER_SITE_OTHER): Payer: BC Managed Care – PPO | Admitting: Physician Assistant

## 2021-07-27 ENCOUNTER — Ambulatory Visit: Payer: BC Managed Care – PPO | Admitting: Family Medicine

## 2021-07-27 ENCOUNTER — Encounter: Payer: Self-pay | Admitting: Physician Assistant

## 2021-07-27 VITALS — BP 118/84 | HR 88 | Temp 98.0°F | Resp 18 | Ht 67.0 in | Wt 254.0 lb

## 2021-07-27 DIAGNOSIS — R5383 Other fatigue: Secondary | ICD-10-CM | POA: Diagnosis not present

## 2021-07-27 DIAGNOSIS — E876 Hypokalemia: Secondary | ICD-10-CM

## 2021-07-27 DIAGNOSIS — M25561 Pain in right knee: Secondary | ICD-10-CM | POA: Diagnosis not present

## 2021-07-27 DIAGNOSIS — I10 Essential (primary) hypertension: Secondary | ICD-10-CM

## 2021-07-27 DIAGNOSIS — Z6841 Body Mass Index (BMI) 40.0 and over, adult: Secondary | ICD-10-CM

## 2021-07-27 DIAGNOSIS — G4709 Other insomnia: Secondary | ICD-10-CM | POA: Diagnosis not present

## 2021-07-27 DIAGNOSIS — G8929 Other chronic pain: Secondary | ICD-10-CM

## 2021-07-27 DIAGNOSIS — M25562 Pain in left knee: Secondary | ICD-10-CM

## 2021-07-27 MED ORDER — TRAZODONE HCL 50 MG PO TABS: 50.0000 mg | ORAL_TABLET | Freq: Every day | ORAL | 0 refills | Status: DC

## 2021-07-27 MED ORDER — PHENTERMINE HCL 37.5 MG PO CAPS: 37.5000 mg | ORAL_CAPSULE | ORAL | 0 refills | Status: DC

## 2021-07-27 MED ORDER — DICLOFENAC SODIUM 75 MG PO TBEC: 75.0000 mg | DELAYED_RELEASE_TABLET | Freq: Two times a day (BID) | ORAL | 0 refills | Status: DC

## 2021-07-27 NOTE — Progress Notes (Signed)
? ?Established Patient Office Visit ? ?Subjective   ?Patient ID: [name], female    DOB: May 11, 1970  Age: 51 y.o. MRN: [id number] ? ?Chief Complaint  ?Patient presents with  ? Medication Refill  ?   ?  ? Knee Pain  ?  L  ? ? ?States that she was seen by her primary care provider last month and was started on hydrochlorothiazide due to elevated blood pressure readings.  States that she has been checking her blood pressure readings at home, states that they have been within normal limits.  Does endorse that she has been feeling tired, lacking of energy, and has noticed a change in her taste buds. ? ?States that she did start the phentermine and has been taking that on a daily basis, has been out for the past week.  States that she has increased her water, is drinking almost 64 ounces of water a day.  Did have a 25 pound weight loss in the past month . ? ?States that she was also started on Prozac at that visit, states that she took it for 4 days, but did not like the way it made her feel so she did not continue. ? ?States that she has been having difficulty sleeping, states that she has tried over-the-counter sleep aids without relief. ? ?States that she suffers from chronic knee pain, is working on weight loss so she can have knee replacement surgery.  States that she has received injections, and has been taking meloxicam 15 mg on a daily basis.  States that she feels it is not offering relief anymore and would like to try diclofenac.  States that she has previously had a reaction to ibuprofen but has not had any issues with meloxicam. ?  ? ? ?  07/27/2021  ?  4:04 PM 06/19/2021  ?  9:21 AM  ?PHQ9 SCORE ONLY  ?PHQ-9 Total Score 7 10  ? ? ?  07/27/2021  ?  4:04 PM  ?GAD 7 : Generalized Anxiety Score  ?Nervous, Anxious, on Edge 0  ?Control/stop worrying 0  ?Worry too much - different things 0  ?Trouble relaxing 2  ?Restless 0  ?Easily annoyed or irritable 1  ?Afraid - awful might happen 0  ?Total GAD 7 Score 3   ? ? ? ? ?Past Medical History:  ?Diagnosis Date  ? Anemia   ? Arthritis   ? Depression   ? due to perimenopausal phase per pt.  ? GERD (gastroesophageal reflux disease)   ? History of hiatal hernia   ? Postpartum care following cesarean delivery (4/27) 07/28/2015  ? Pregnancy induced hypertension   ? during pregnancy  ? Sleep apnea   ? no machine  ? Varicose veins   ? Vitamin D deficiency   ? ?Social History  ? ?Socioeconomic History  ? Marital status: Married  ?  Spouse name: Not on file  ? Number of children: Not on file  ? Years of education: Not on file  ? Highest education level: Not on file  ?Occupational History  ? Not on file  ?Tobacco Use  ? Smoking status: Former  ?  Types: Cigarettes  ?  Quit date: 03/06/2011  ?  Years since quitting: 10.4  ? Smokeless tobacco: Never  ?Vaping Use  ? Vaping Use: Never used  ?Substance and Sexual Activity  ? Alcohol use: No  ?  Alcohol/week: 0.0 standard drinks  ? Drug use: No  ? Sexual activity: Yes  ?Other Topics Concern  ?  Not on file  ?Social History Narrative  ? ** Merged History Encounter **  ?    ? ?Social Determinants of Health  ? ?Financial Resource Strain: Not on file  ?Food Insecurity: Not on file  ?Transportation Needs: Not on file  ?Physical Activity: Not on file  ?Stress: Not on file  ?Social Connections: Not on file  ?Intimate Partner Violence: Not on file  ? ?Family History  ?Problem Relation Age of Onset  ? Heart disease Mother   ? Diabetes Mother   ? Hypertension Mother   ? Cancer Father   ? Hypertension Father   ? Asthma Sister   ? Seizures Sister   ? Bipolar disorder Sister   ? ?Allergies  ?Allergen Reactions  ? Ibuprofen Anaphylaxis  ? Tramadol Nausea And Vomiting  ? ?  ? ?Review of Systems  ?Constitutional:  Positive for malaise/fatigue.  ?HENT: Negative.    ?Eyes: Negative.   ?Respiratory:  Negative for shortness of breath.   ?Cardiovascular:  Negative for chest pain.  ?Gastrointestinal: Negative.   ?Genitourinary: Negative.   ?Musculoskeletal:   Positive for joint pain and myalgias.  ?Skin: Negative.   ?Neurological: Negative.   ?Endo/Heme/Allergies: Negative.   ?Psychiatric/Behavioral:  Positive for depression. Negative for suicidal ideas. The patient has insomnia. The patient is not nervous/anxious.   ? ?  ?Objective:  ?  ? ?BP 118/84 (BP Location: Right Arm, Patient Position: Sitting, Cuff Size: Normal)   Pulse 88   Temp 98 ?F (36.7 ?C) (Oral)   Resp 18   Ht '5\' 7"'  (1.702 m)   Wt 254 lb (115.2 kg)   LMP 06/19/2021   SpO2 97%   BMI 39.78 kg/m?  ? Danley Danker Weights  ? 07/27/21 1601  ?Weight: 254 lb (115.2 kg)  ? ? ? ?Physical Exam ?Vitals and nursing note reviewed.  ?Constitutional:   ?   Appearance: Normal appearance. She is obese.  ?HENT:  ?   Head: Normocephalic and atraumatic.  ?   Right Ear: External ear normal.  ?   Left Ear: External ear normal.  ?   Nose: Nose normal.  ?   Mouth/Throat:  ?   Mouth: Mucous membranes are moist.  ?   Pharynx: Oropharynx is clear.  ?Eyes:  ?   Extraocular Movements: Extraocular movements intact.  ?   Conjunctiva/sclera: Conjunctivae normal.  ?   Pupils: Pupils are equal, round, and reactive to light.  ?Cardiovascular:  ?   Rate and Rhythm: Normal rate and regular rhythm.  ?   Pulses: Normal pulses.  ?   Heart sounds: Normal heart sounds.  ?Pulmonary:  ?   Effort: Pulmonary effort is normal.  ?   Breath sounds: Normal breath sounds.  ?Musculoskeletal:     ?   General: Normal range of motion.  ?   Cervical back: Normal range of motion and neck supple.  ?Skin: ?   General: Skin is warm and dry.  ?Neurological:  ?   General: No focal deficit present.  ?   Mental Status: She is alert and oriented to person, place, and time.  ?Psychiatric:     ?   Mood and Affect: Mood normal.     ?   Behavior: Behavior normal.     ?   Thought Content: Thought content normal.     ?   Judgment: Judgment normal.  ? ? ? ?Results for orders placed or performed in visit on 07/27/21  ?CBC with Differential/Platelet  ?Result Value Ref Range  ?  WBC 9.3 3.4 - 10.8 x10E3/uL  ? RBC 5.09 3.77 - 5.28 x10E6/uL  ? Hemoglobin 15.6 11.1 - 15.9 g/dL  ? Hematocrit 45.9 34.0 - 46.6 %  ? MCV 90 79 - 97 fL  ? MCH 30.6 26.6 - 33.0 pg  ? MCHC 34.0 31.5 - 35.7 g/dL  ? RDW 12.7 11.7 - 15.4 %  ? Platelets 305 150 - 450 x10E3/uL  ? Neutrophils 58 Not Estab. %  ? Lymphs 34 Not Estab. %  ? Monocytes 6 Not Estab. %  ? Eos 1 Not Estab. %  ? Basos 1 Not Estab. %  ? Neutrophils Absolute 5.4 1.4 - 7.0 x10E3/uL  ? Lymphocytes Absolute 3.1 0.7 - 3.1 x10E3/uL  ? Monocytes Absolute 0.6 0.1 - 0.9 x10E3/uL  ? EOS (ABSOLUTE) 0.1 0.0 - 0.4 x10E3/uL  ? Basophils Absolute 0.1 0.0 - 0.2 x10E3/uL  ? Immature Granulocytes 0 Not Estab. %  ? Immature Grans (Abs) 0.0 0.0 - 0.1 x10E3/uL  ?Comp. Metabolic Panel (12)  ?Result Value Ref Range  ? Glucose 99 70 - 99 mg/dL  ? BUN 15 6 - 24 mg/dL  ? Creatinine, Ser 0.77 0.57 - 1.00 mg/dL  ? eGFR 94 >59 mL/min/1.73  ? BUN/Creatinine Ratio 19 9 - 23  ? Sodium 140 134 - 144 mmol/L  ? Potassium 3.1 (L) 3.5 - 5.2 mmol/L  ? Chloride 94 (L) 96 - 106 mmol/L  ? Calcium 9.7 8.7 - 10.2 mg/dL  ? Total Protein 7.0 6.0 - 8.5 g/dL  ? Albumin 4.8 3.8 - 4.8 g/dL  ? Globulin, Total 2.2 1.5 - 4.5 g/dL  ? Albumin/Globulin Ratio 2.2 1.2 - 2.2  ? Bilirubin Total 1.4 (H) 0.0 - 1.2 mg/dL  ? Alkaline Phosphatase 86 44 - 121 IU/L  ? AST 34 0 - 40 IU/L  ?TSH  ?Result Value Ref Range  ? TSH 1.310 0.450 - 4.500 uIU/mL  ? ? ?  ?Assessment & Plan:  ? ?Problem List Items Addressed This Visit   ?None ?Visit Diagnoses   ? ? Essential hypertension    -  Primary  ? Relevant Orders  ? CBC with Differential/Platelet (Completed)  ? Comp. Metabolic Panel (12) (Completed)  ? Fatigue, unspecified type      ? Relevant Orders  ? TSH (Completed)  ? Other insomnia      ? Relevant Medications  ? traZODone (DESYREL) 50 MG tablet  ? Chronic pain of both knees      ? Relevant Medications  ? diclofenac (VOLTAREN) 75 MG EC tablet  ? traZODone (DESYREL) 50 MG tablet  ? Class 3 severe obesity due to excess  calories with serious comorbidity and body mass index (BMI) of 40.0 to 44.9 in adult [facility])      ? Relevant Medications  ? phentermine 37.5 MG capsule  ? ?  ? ?1. Essential hypertension ?Continue current regimen, no refill nee

## 2021-07-27 NOTE — Patient Instructions (Signed)
You are going to start taking diclofenac instead of the meloxicam.  I encourage you to be very mindful given that it is in the same family as ibuprofen. ? ?I encourage you to do the trial of trazodone to help you improve your sleep, you should be sleeping 7 to 8 hours a night. ? ?I encourage you to increase your water intake, you should be drinking between 80 and 100 ounces. ? ?We will call you with today's lab results ? ?Roney Jaffe, PA-C ?Physician Assistant ?[facility] ?[email or web address] ? ? ?Insomnia ?Insomnia is a sleep disorder that makes it difficult to fall asleep or stay asleep. Insomnia can cause fatigue, low energy, difficulty concentrating, mood swings, and poor performance at work or school. ?There are three different ways to classify insomnia: ?Difficulty falling asleep. ?Difficulty staying asleep. ?Waking up too early in the morning. ?Any type of insomnia can be long-term (chronic) or short-term (acute). Both are common. Short-term insomnia usually lasts for 3 months or less. Chronic insomnia occurs at least three times a week for longer than 3 months. ?What are the causes? ?Insomnia may be caused by another condition, situation, or substance, such as: ?Having certain mental health conditions, such as anxiety and depression. ?Using caffeine, alcohol, tobacco, or drugs. ?Having gastrointestinal conditions, such as gastroesophageal reflux disease (GERD). ?Having certain medical conditions. These include: ?Asthma. ?Alzheimer's disease. ?Stroke. ?Chronic pain. ?An overactive thyroid gland (hyperthyroidism). ?Other sleep disorders, such as restless legs syndrome and sleep apnea. ?Menopause. ?Sometimes, the cause of insomnia may not be known. ?What increases the risk? ?Risk factors for insomnia include: ?Gender. Females are affected more often than males. ?Age. Insomnia is more common as people get older. ?Stress and certain medical and mental  health conditions. ?Lack of exercise. ?Having an irregular work schedule. This may include working night shifts and traveling between different time zones. ?What are the signs or symptoms? ?If you have insomnia, the main symptom is having trouble falling asleep or having trouble staying asleep. This may lead to other symptoms, such as: ?Feeling tired or having low energy. ?Feeling nervous about going to sleep. ?Not feeling rested in the morning. ?Having trouble concentrating. ?Feeling irritable, anxious, or depressed. ?How is this diagnosed? ?This condition may be diagnosed based on: ?Your symptoms and medical history. Your health care provider may ask about: ?Your sleep habits. ?Any medical conditions you have. ?Your mental health. ?A physical exam. ?How is this treated? ?Treatment for insomnia depends on the cause. Treatment may focus on treating an underlying condition that is causing the insomnia. Treatment may also include: ?Medicines to help you sleep. ?Counseling or therapy. ?Lifestyle adjustments to help you sleep better. ?Follow these instructions at home: ?Eating and drinking ? ?Limit or avoid alcohol, caffeinated beverages, and products that contain nicotine and tobacco, especially close to bedtime. These can disrupt your sleep. ?Do not eat a large meal or eat spicy foods right before bedtime. This can lead to digestive discomfort that can make it hard for you to sleep. ?Sleep habits ? ?Keep a sleep diary to help you and your health care provider figure out what could be causing your insomnia. Write down: ?When you sleep. ?When you wake up during the night. ?How well you sleep and how rested you feel the next day. ?Any side effects of medicines you are taking. ?What you eat and drink. ?Make your bedroom a dark, comfortable place where it is easy to fall asleep. ?Put up shades or blackout curtains to block light  from outside. ?Use a white noise machine to block noise. ?Keep the temperature cool. ?Limit  screen use before bedtime. This includes: ?Not watching TV. ?Not using your smartphone, tablet, or computer. ?Stick to a routine that includes going to bed and waking up at the same times every day and night. This can help you fall asleep faster. Consider making a quiet activity, such as reading, part of your nighttime routine. ?Try to avoid taking naps during the day so that you sleep better at night. ?Get out of bed if you are still awake after 15 minutes of trying to sleep. Keep the lights down, but try reading or doing a quiet activity. When you feel sleepy, go back to bed. ?General instructions ?Take over-the-counter and prescription medicines only as told by your health care provider. ?Exercise regularly as told by your health care provider. However, avoid exercising in the hours right before bedtime. ?Use relaxation techniques to manage stress. Ask your health care provider to suggest some techniques that may work well for you. These may include: ?Breathing exercises. ?Routines to release muscle tension. ?Visualizing peaceful scenes. ?Make sure that you drive carefully. Do not drive if you feel very sleepy. ?Keep all follow-up visits. This is important. ?Contact a health care provider if: ?You are tired throughout the day. ?You have trouble in your daily routine due to sleepiness. ?You continue to have sleep problems, or your sleep problems get worse. ?Get help right away if: ?You have thoughts about hurting yourself or someone else. ?Get help right away if you feel like you may hurt yourself or others, or have thoughts about taking your own life. Go to your nearest emergency room or: ?Call 911. ?Call the National Suicide Prevention Lifeline at 813-142-1695 or 988. This is open 24 hours a day. ?Text the Crisis Text Line at (815)297-1222. ?Summary ?Insomnia is a sleep disorder that makes it difficult to fall asleep or stay asleep. ?Insomnia can be long-term (chronic) or short-term (acute). ?Treatment for insomnia  depends on the cause. Treatment may focus on treating an underlying condition that is causing the insomnia. ?Keep a sleep diary to help you and your health care provider figure out what could be causing your insomnia. ?This information is not intended to replace advice given to you by your health care provider. Make sure you discuss any questions you have with your health care provider. ?Document Revised: 02/27/2021 Document Reviewed: 02/27/2021 ?Elsevier Patient Education ? 2023 [employer] ? ?

## 2021-07-27 NOTE — Progress Notes (Signed)
Patient has eaten and taken medication today. ?Patient reports chronic knee pain scaled at a 10 currently.Patient request refill for metformin, phentermine and alternate pain medication for knee. ?

## 2021-07-28 LAB — CBC WITH DIFFERENTIAL/PLATELET
Basophils Absolute: 0.1 10*3/uL (ref 0.0–0.2)
Basos: 1 %
EOS (ABSOLUTE): 0.1 10*3/uL (ref 0.0–0.4)
Eos: 1 %
Hematocrit: 45.9 % (ref 34.0–46.6)
Hemoglobin: 15.6 g/dL (ref 11.1–15.9)
Immature Grans (Abs): 0 10*3/uL (ref 0.0–0.1)
Immature Granulocytes: 0 %
Lymphocytes Absolute: 3.1 10*3/uL (ref 0.7–3.1)
Lymphs: 34 %
MCH: 30.6 pg (ref 26.6–33.0)
MCHC: 34 g/dL (ref 31.5–35.7)
MCV: 90 fL (ref 79–97)
Monocytes Absolute: 0.6 10*3/uL (ref 0.1–0.9)
Monocytes: 6 %
Neutrophils Absolute: 5.4 10*3/uL (ref 1.4–7.0)
Neutrophils: 58 %
Platelets: 305 10*3/uL (ref 150–450)
RBC: 5.09 x10E6/uL (ref 3.77–5.28)
RDW: 12.7 % (ref 11.7–15.4)
WBC: 9.3 10*3/uL (ref 3.4–10.8)

## 2021-07-28 LAB — COMP. METABOLIC PANEL (12)
AST: 34 IU/L (ref 0–40)
Albumin/Globulin Ratio: 2.2 (ref 1.2–2.2)
Albumin: 4.8 g/dL (ref 3.8–4.8)
Alkaline Phosphatase: 86 IU/L (ref 44–121)
BUN/Creatinine Ratio: 19 (ref 9–23)
BUN: 15 mg/dL (ref 6–24)
Bilirubin Total: 1.4 mg/dL — ABNORMAL HIGH (ref 0.0–1.2)
Calcium: 9.7 mg/dL (ref 8.7–10.2)
Chloride: 94 mmol/L — ABNORMAL LOW (ref 96–106)
Creatinine, Ser: 0.77 mg/dL (ref 0.57–1.00)
Globulin, Total: 2.2 g/dL (ref 1.5–4.5)
Glucose: 99 mg/dL (ref 70–99)
Potassium: 3.1 mmol/L — ABNORMAL LOW (ref 3.5–5.2)
Sodium: 140 mmol/L (ref 134–144)
Total Protein: 7 g/dL (ref 6.0–8.5)
eGFR: 94 mL/min/{1.73_m2} (ref >59)

## 2021-07-28 LAB — TSH: TSH: 1.31 u[IU]/mL (ref 0.450–4.500)

## 2021-08-02 ENCOUNTER — Telehealth: Payer: Self-pay | Admitting: Physician Assistant

## 2021-08-02 NOTE — Telephone Encounter (Signed)
-----   Message from Kennieth Rad, PA-C sent at 07/29/2021 10:49 AM EDT ----- ?Please call patient and let her know that her thyroid function and kidney function and liver function are within normal limits.   she does not show signs of anemia.  Potassium is below normal limits.  She needs to stop the hydrochlorothiazide, continue the spironolactone.  Increase her potassium intake.  She does need to follow-up in the clinic next week, this can be a virtual visit. ?

## 2021-08-02 NOTE — Telephone Encounter (Signed)
Copied from CRM [id number]. Topic: Appointment Scheduling - Scheduling Inquiry for Clinic ?>> Aug 01, 2021  2:11 PM Gaetana Michaelis A wrote: ?Reason for CRM: The patient was directed to contact the practice to schedule an appointment either virtually or in person to review their recent lab work  ? ?There were no available appointments meeting this criteria at the time of call with agent  ? ?Please contact further when possible ?

## 2021-08-02 NOTE — Telephone Encounter (Signed)
Patient is aware of results and is scheduled for telephone visit on 08/07/21 at 8:20am ?

## 2021-08-09 ENCOUNTER — Ambulatory Visit: Payer: Self-pay | Admitting: *Deleted

## 2021-08-09 NOTE — Telephone Encounter (Signed)
Summary: thrush one week/not better  ? Pt states she has thrush.  Went to UC, given mouthwash one week ago. She states she is not better.  Wanted appt today.  Nothing available   ?  ? ?Reason for Disposition ? [1] SEVERE mouth pain (e.g., excruciating) AND [2] not improved after 2 hours of pain medicine ? ?Answer Assessment - Initial Assessment Questions ?1. ONSET: "When did the mouth start hurting?" (e.g., hours or days ago)  ?    2 weeks ?2. SEVERITY: "How bad is the pain?" (Scale 1-10; mild, moderate or severe) ?  - MILD (1-3):  doesn't interfere with eating or normal activities ?  - MODERATE (4-7): interferes with eating some solids and normal activities ?  - SEVERE (8-10):  excruciating pain, interferes with most normal activities ?  - SEVERE DYSPHAGIA: can't swallow liquids, drooling ?    Moderate/severe ?3. SORES: "Are there any sores or ulcers in the mouth?" If Yes, ask: "What part of the mouth are the sores in?" ?    No- back of tongue irritated ?4. FEVER: "Do you have a fever?" If Yes, ask: "What is your temperature, how was it measured, and when did it start?" ?    no ?5. CAUSE: "What do you think is causing the mouth pain?" ?    Diagnosed with thrush at UC ?6. OTHER SYMPTOMS: "Do you have any other symptoms?" (e.g., difficulty breathing) ?    Tongue pain, sore throat, unable to taste and eat, lips sore ? ?Protocols used: Mouth Pain-A-AH ? ?

## 2021-08-09 NOTE — Telephone Encounter (Signed)
?  Chief Complaint: mouth pain- thrush diagnosed- not better ?Symptoms: pain, redness- tongue, lips, throat ?Frequency: 2 weeks ?Pertinent Negatives: Patient denies fever ?Disposition: [] ED /[x] Urgent Care (no appt availability in office) / [] Appointment(In office/virtual)/ []  [facility]/ [] Home Care/ [] Refused Recommended Disposition /[] [facility]/ []  Follow-up with PCP ?Additional Notes: No open appointment- attempted to call office FC- VM. Patient advised UC  ?

## 2021-08-24 ENCOUNTER — Telehealth: Payer: Self-pay | Admitting: *Deleted

## 2021-08-24 ENCOUNTER — Encounter: Payer: Self-pay | Admitting: Family Medicine

## 2021-08-24 ENCOUNTER — Ambulatory Visit (INDEPENDENT_AMBULATORY_CARE_PROVIDER_SITE_OTHER): Payer: BC Managed Care – PPO | Admitting: Family Medicine

## 2021-08-24 VITALS — BP 128/87 | HR 82 | Temp 98.1°F | Resp 16 | Ht 67.0 in | Wt 247.0 lb

## 2021-08-24 DIAGNOSIS — Z6841 Body Mass Index (BMI) 40.0 and over, adult: Secondary | ICD-10-CM

## 2021-08-24 DIAGNOSIS — E876 Hypokalemia: Secondary | ICD-10-CM

## 2021-08-24 DIAGNOSIS — M25561 Pain in right knee: Secondary | ICD-10-CM

## 2021-08-24 DIAGNOSIS — G8929 Other chronic pain: Secondary | ICD-10-CM

## 2021-08-24 DIAGNOSIS — I10 Essential (primary) hypertension: Secondary | ICD-10-CM

## 2021-08-24 DIAGNOSIS — M25562 Pain in left knee: Secondary | ICD-10-CM

## 2021-08-24 DIAGNOSIS — F32A Depression, unspecified: Secondary | ICD-10-CM | POA: Diagnosis not present

## 2021-08-24 MED ORDER — POTASSIUM CHLORIDE CRYS ER 20 MEQ PO TBCR: 20.0000 meq | EXTENDED_RELEASE_TABLET | Freq: Every day | ORAL | 0 refills | Status: DC

## 2021-08-24 MED ORDER — PHENTERMINE HCL 37.5 MG PO CAPS: 37.5000 mg | ORAL_CAPSULE | ORAL | 0 refills | Status: DC

## 2021-08-24 MED ORDER — METFORMIN HCL 1000 MG PO TABS: 1000.0000 mg | ORAL_TABLET | Freq: Every day | ORAL | 0 refills | Status: DC

## 2021-08-24 MED ORDER — FLUOXETINE HCL 20 MG PO TABS: 20.0000 mg | ORAL_TABLET | Freq: Every day | ORAL | 0 refills | Status: DC

## 2021-08-24 MED ORDER — MELOXICAM 15 MG PO TABS: 15.0000 mg | ORAL_TABLET | Freq: Every day | ORAL | 0 refills | Status: DC

## 2021-08-24 MED ORDER — HYDROCHLOROTHIAZIDE 25 MG PO TABS: 25.0000 mg | ORAL_TABLET | Freq: Every day | ORAL | 0 refills | Status: DC

## 2021-08-24 NOTE — Progress Notes (Signed)
Patient is  here for her f/up for hypertension Patient said that she has been dealing with thrush in her mouth /throat.  Patient would like to talk about her lab  report Patient would like to talk with provider about her concerns

## 2021-08-24 NOTE — Telephone Encounter (Signed)
error 

## 2021-08-24 NOTE — Progress Notes (Signed)
Established Patient Office Visit  Subjective    Patient ID: [name], female    DOB: 1970-07-07  Age: 51 y.o. MRN: [id number]  CC:  Chief Complaint  Patient presents with   Follow-up   Hypertension    HPI [name] presents for follow up of chronic meds issues including hypertension, anxiety/depression, and weight management.     Outpatient Encounter Medications as of 08/24/2021  Medication Sig   cetirizine (ZYRTEC) 10 MG tablet Take 10 mg by mouth daily as needed for allergies (alternating with Allegra).   fexofenadine (ALLEGRA) 60 MG tablet Take 60 mg by mouth daily as needed for allergies or rhinitis (alternating with Zyrtec).   FLUoxetine (PROZAC) 20 MG tablet Take 1 tablet (20 mg total) by mouth daily.   meloxicam (MOBIC) 15 MG tablet Take 1 tablet (15 mg total) by mouth daily.   pantoprazole (PROTONIX) 40 MG tablet Take 1 tablet (40 mg total) by mouth daily.   potassium chloride SA (KLOR-CON M) 20 MEQ tablet Take 1 tablet (20 mEq total) by mouth daily.   spironolactone (ALDACTONE) 25 MG tablet Take 1 tablet (25 mg total) by mouth daily.   traZODone (DESYREL) 50 MG tablet Take 1 tablet (50 mg total) by mouth at bedtime.   [DISCONTINUED] diclofenac (VOLTAREN) 75 MG EC tablet Take 1 tablet (75 mg total) by mouth 2 (two) times daily.   [DISCONTINUED] metFORMIN (GLUCOPHAGE) 1000 MG tablet Take 1,000 mg by mouth daily.   [DISCONTINUED] phentermine 37.5 MG capsule Take 1 capsule (37.5 mg total) by mouth every morning.   hydrochlorothiazide (HYDRODIURIL) 25 MG tablet Take 1 tablet (25 mg total) by mouth daily.   metFORMIN (GLUCOPHAGE) 1000 MG tablet Take 1 tablet (1,000 mg total) by mouth daily.   phentermine 37.5 MG capsule Take 1 capsule (37.5 mg total) by mouth every morning.   [DISCONTINUED] hydrochlorothiazide (HYDRODIURIL) 25 MG tablet Take 1 tablet (25 mg total) by mouth daily. (Patient not taking: Reported on 08/24/2021)   No facility-administered encounter medications on  file as of 08/24/2021.    Past Medical History:  Diagnosis Date   Anemia    Arthritis    Depression    due to perimenopausal phase per pt.   GERD (gastroesophageal reflux disease)    History of hiatal hernia    Postpartum care following cesarean delivery (4/27) 07/28/2015   Pregnancy induced hypertension    during pregnancy   Sleep apnea    no machine   Varicose veins    Vitamin D deficiency     Past Surgical History:  Procedure Laterality Date   ABDOMINAL EXPOSURE N/A 04/27/2019   Procedure: ABDOMINAL EXPOSURE;  Surgeon: Angelia Mould, MD;  [facility];  Service: Vascular;  Laterality: N/A;   ANTERIOR LUMBAR FUSION N/A 04/27/2019   Procedure: ANTERIOR LUMBAR INTERBODY FUSION LUMBAR FOUR-FIVE WITH INSTRUMENTATION AND ALLOGRAFT;  Surgeon: Phylliss Bob, MD;  Location: [place];  Service: Orthopedics;  Laterality: N/A;   CESAREAN SECTION N/A 07/28/2015   Procedure: CESAREAN SECTION;  Surgeon: Brien Few, MD;  Location: [facility];  Service: Obstetrics;  Laterality: N/A;   ESOPHAGEAL MANOMETRY N/A 01/02/2017   Procedure: ESOPHAGEAL MANOMETRY (EM);  Surgeon: Ronnette Juniper, MD;  Location: [facility];  Service: Gastroenterology;  Laterality: N/A;   HERNIA REPAIR     hiatal hernia repair 03-06-17   INSERTION OF MESH N/A 03/06/2017   Procedure: INSERTION OF MESH;  Surgeon: Ralene Ok, MD;  Location: [facility];  Service: General;  Laterality: N/A;  TOE SURGERY Bilateral     Family History  Problem Relation Age of Onset   Heart disease Mother    Diabetes Mother    Hypertension Mother    Cancer Father    Hypertension Father    Asthma Sister    Seizures Sister    Bipolar disorder Sister     Social History   Socioeconomic History   Marital status: Married    Spouse name: Not on file   Number of children: Not on file   Years of education: Not on file   Highest education level: Not on file  Occupational History   Not on file  Tobacco Use   Smoking status: Former     Types: Cigarettes    Quit date: 03/06/2011    Years since quitting: 10.4   Smokeless tobacco: Never  Vaping Use   Vaping Use: Never used  Substance and Sexual Activity   Alcohol use: No    Alcohol/week: 0.0 standard drinks   Drug use: No   Sexual activity: Yes  Other Topics Concern   Not on file  Social History Narrative   ** Merged History Encounter **       Social Determinants of Health   Financial Resource Strain: Not on file  Food Insecurity: Not on file  Transportation Needs: Not on file  Physical Activity: Not on file  Stress: Not on file  Social Connections: Not on file  Intimate Partner Violence: Not on file    Review of Systems  All other systems reviewed and are negative.      Objective    BP 128/87   Pulse 82   Temp 98.1 F (36.7 C) (Oral)   Resp 16   Ht 5\' 7"  (1.702 m)   Wt 247 lb (112 kg)   BMI 38.69 kg/m   Physical Exam Vitals and nursing note reviewed.  Constitutional:      General: She is not in acute distress.    Appearance: She is obese.  Cardiovascular:     Rate and Rhythm: Normal rate and regular rhythm.  Pulmonary:     Effort: Pulmonary effort is normal.     Breath sounds: Normal breath sounds.  Abdominal:     Palpations: Abdomen is soft.     Tenderness: There is no abdominal tenderness.  Neurological:     General: No focal deficit present.     Mental Status: She is alert and oriented to person, place, and time.        Assessment & Plan:   1. Essential hypertension Appears stable. Continue present management. Meds refilled.   2. HYPOKALEMIA Patient developed some decreased potassium with diuretic. Potassium 10 meq daily recommended. monitor  3. Class 3 severe obesity due to excess calories with serious comorbidity and body mass index (BMI) of 40.0 to 44.9 in adult (HCC) Good weight loss. Meds refilled. Goal is 3-5lbs/mo wt loss. - phentermine 37.5 MG capsule; Take 1 capsule (37.5 mg total) by mouth every morning.   Dispense: 30 capsule; Refill: 0  4. Depression, unspecified depression type Improved. Continue present management meds refilled.   5. Chronic pain of both knees Patient prefers meloxicam over voltaren. Meds refilled.     Return in about 4 weeks (around 09/21/2021) for follow up.   Becky Sax, MD

## 2021-09-03 ENCOUNTER — Other Ambulatory Visit: Payer: Self-pay | Admitting: Physician Assistant

## 2021-09-03 DIAGNOSIS — G4709 Other insomnia: Secondary | ICD-10-CM

## 2021-09-03 NOTE — Telephone Encounter (Signed)
Refilled per patient request. 

## 2021-09-05 ENCOUNTER — Ambulatory Visit: Payer: BC Managed Care – PPO | Admitting: Physician Assistant

## 2021-09-05 ENCOUNTER — Encounter: Payer: Self-pay | Admitting: Physician Assistant

## 2021-09-05 VITALS — BP 128/81 | HR 79 | Resp 19 | Ht 67.0 in | Wt 247.0 lb

## 2021-09-05 DIAGNOSIS — B370 Candidal stomatitis: Secondary | ICD-10-CM | POA: Diagnosis not present

## 2021-09-05 MED ORDER — NYSTATIN 100000 UNIT/ML MT SUSP: 5.0000 mL | Freq: Three times a day (TID) | OROMUCOSAL | 0 refills | Status: DC

## 2021-09-05 NOTE — Progress Notes (Unsigned)
   Established Patient Office Visit  Subjective   Patient ID: [name], female    DOB: 1970/04/06  Age: 51 y.o. MRN: [id number]  No chief complaint on file.   States that she has been having thrsh and a severe sore throat for the past month States that she was seen by her dentist and was given nystatin rinse and diflucan for 6 days and not helping Salt water - cranberry juice Throat lonzges at night   Hard to taste - throat hurts really and makes her ears hurt -  Morning when first wakes  Mouth cotton - lips burning   States that she has tried mouthwash  Painful - taste is off - having trouble eating    {History (Optional):23778}  ROS    Objective:     There were no vitals taken for this visit. {Vitals History (Optional):23777}  Physical Exam   No results found for any visits on 09/05/21.  {Labs (Optional):23779}  The ASCVD Risk score (Arnett DK, et al., 2019) failed to calculate for the following reasons:   Cannot find a previous HDL lab   Cannot find a previous total cholesterol lab    Assessment & Plan:   Problem List Items Addressed This Visit   None   No follow-ups on file.    Kasandra Knudsen Mayers, PA-C

## 2021-09-05 NOTE — Patient Instructions (Signed)
You are going to use Magic mouthwash 3 times a day until resolved.  I hope that you feel better soon, please let us know if there is anything else we can do for you.  Roney Jaffe, PA-C Physician Assistant [facility] [email or web address]   Oral Thrush, Adult Oral thrush, also called oral candidiasis, is a fungal infection that develops in the mouth and throat and on the tongue. It causes white patches to form in the mouth and on the tongue. Many cases of thrush are mild, but this infection can also be serious. Ginette Pitman can be a repeated (recurrent) problem for certain people who have a weak body defense system (immune system). The weakness can be caused by chronic illnesses, or by taking medicines that limit the body's ability to fight infection. If a person has difficulty fighting infection, the fungus that causes thrush can spread through the body. This can cause life-threatening blood or organ infections. What are the causes? This condition is caused by a fungus (yeast) called Candida albicans. This fungus is normally present in small amounts in the mouth and on other mucous membranes. It usually causes no harm. If conditions are present that allow the fungus to grow without control, it invades surrounding tissues and becomes an infection. Other Candida species can also lead to thrush, though this is rare. What increases the risk? The following factors may make you more likely to develop this condition: Having a weakened immune system. Being an older adult. Having diabetes, cancer, or HIV (human immunodeficiency virus). Having dry mouth (xerostomia). Being pregnant or breastfeeding. Having poor dental care, especially in those who have dentures. Using antibiotic or steroid medicines. What are the signs or symptoms? Symptoms of this condition can vary from mild and moderate to severe and persistent. Symptoms may include: A burning  feeling in the mouth and throat. This can occur at the start of a thrush infection. White patches that stick to the mouth and tongue. The tissue around the patches may be red, raw, and painful. If rubbed (during tooth brushing, for example), the patches and the tissue of the mouth may bleed easily. A bad taste in the mouth or difficulty tasting foods. A cottony feeling in the mouth. Pain during eating and swallowing. Poor appetite. Cracking at the corners of the mouth. How is this diagnosed? This condition is diagnosed based on: A physical exam. Your medical history. How is this treated? This condition is treated with medicines called antifungals, which prevent the growth of fungi. These medicines are either applied directly to the affected area (topical) or swallowed (oral). The treatment will depend on the severity of the condition. Mild cases of thrush may be treated with an antifungal mouth rinse or lozenges. Treatment usually lasts about 14 days. Moderate to severe cases of thrush can be treated with oral antifungal medicine, if they have spread to the esophagus. A topical antifungal medicine may also be used. For some severe infections, treatment may need to continue for more than 14 days. Oral antifungal medicines are rarely used during pregnancy because they may be harmful to the unborn child. If you are pregnant, talk with your health care provider about options for treatment. Persistent or recurrent thrush. For cases of thrush that do not go away or keep coming back: Treatment may be needed twice as long as the symptoms last. Treatment will include both oral and topical antifungal medicines. People with a weakened immune system can take an antifungal medicine on a continuous basis  to prevent thrush infections. It is important to treat conditions that make a person more likely to get thrush, such as diabetes or HIV. Follow these instructions at home: Relieving soreness and  discomfort To help reduce the discomfort of thrush: Drink cold liquids such as water or iced tea. Try flavored ice treats or frozen juices. Eat foods that are easy to swallow, such as gelatin, ice cream, or custard. Try drinking from a straw if the patches in your mouth are painful.  General instructions Take or use over-the-counter and prescription medicines only as told by your health care provider. Eat plain, unflavored yogurt as directed by your health care provider. Check the label to make sure the yogurt contains live cultures. This yogurt can help healthy bacteria grow in the mouth and can stop the growth of the fungus that causes thrush. If you wear dentures, remove the dentures before going to bed, brush them vigorously, and soak them in a cleaning solution as directed by your health care provider. Rinse your mouth with a warm salt-water mixture several times a day. To make a salt-water mixture, dissolve -1 tsp (3-6 g) of salt in 1 cup (237 mL) of warm water. Contact a health care provider if: Your symptoms are getting worse or are not improving within 7 days of starting treatment. You have symptoms of a spreading infection, such as white patches on the skin outside of the mouth. You are breastfeeding your baby and you have redness and pain in the nipples. Summary Oral thrush, also called oral candidiasis, is a fungal infection that develops in the mouth and throat and on the tongue. It causes white patches to form in the mouth and on the tongue. You are more likely to get this condition if you have a weakened immune system or an underlying condition, such as HIV, cancer, or diabetes. This condition is treated with medicines called antifungals, which prevent the growth of fungi. Contact a health care provider if your symptoms do not improve, or get worse, within 7 days of starting treatment. This information is not intended to replace advice given to you by your health care provider.  Make sure you discuss any questions you have with your health care provider. Document Revised: 11/16/2020 Document Reviewed: 01/23/2019 Elsevier Patient Education  2023 [employer].

## 2021-09-06 ENCOUNTER — Other Ambulatory Visit: Payer: Self-pay | Admitting: Family Medicine

## 2021-09-06 ENCOUNTER — Other Ambulatory Visit: Payer: Self-pay | Admitting: Family

## 2021-09-06 DIAGNOSIS — K219 Gastro-esophageal reflux disease without esophagitis: Secondary | ICD-10-CM

## 2021-09-06 MED ORDER — PANTOPRAZOLE SODIUM 40 MG PO TBEC
40.0000 mg | DELAYED_RELEASE_TABLET | Freq: Every day | ORAL | 0 refills | Status: DC
Start: 2021-09-06 — End: 2022-01-08

## 2021-09-06 NOTE — Telephone Encounter (Signed)
-   Metformin and Hydrochlorothiazide refilled on 08/24/2021 for 90 day supply.  - Pantoprazole refilled per patient request.

## 2021-09-19 IMAGING — RF DG LUMBAR SPINE 2-3V
1 series · 2 of 2 positions shown · non-contrast
Comparison: Lumbar spine MRI 01/14/2019

CLINICAL DATA: Anterior fusion L4-5.

EXAM:
DG C-ARM 1-60 MIN; LUMBAR SPINE - 2-3 VIEW

[Series 1: run · 2 of 2 slices shown]
[im 1/2]
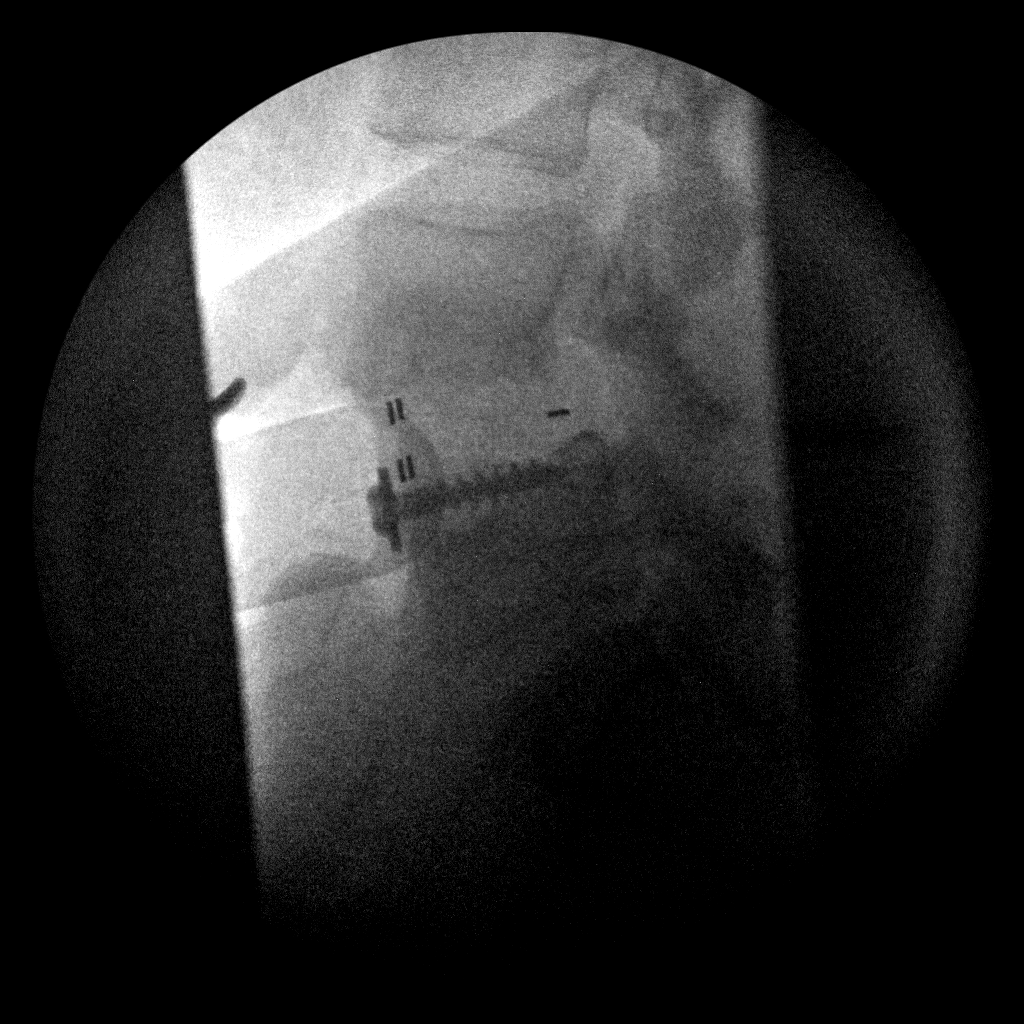
[im 2/2]
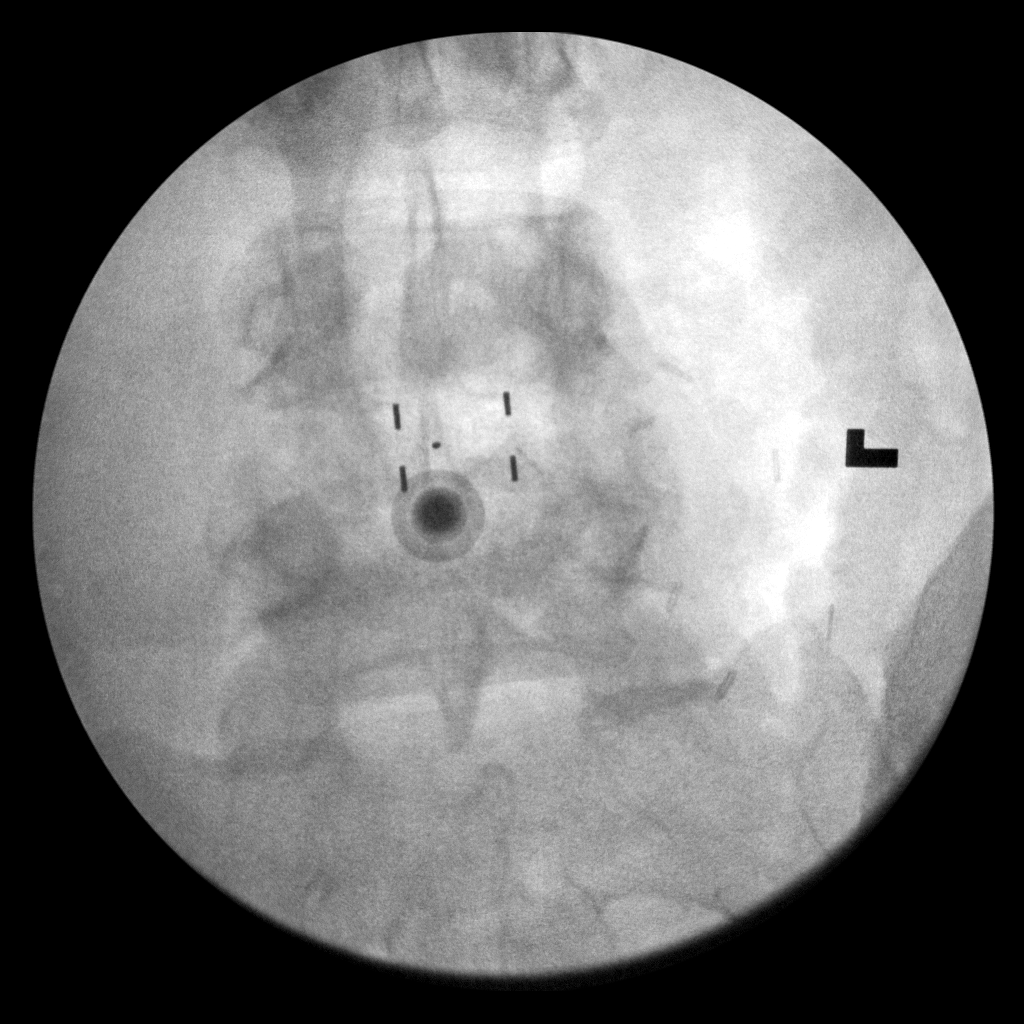

[2 of 2 positions shown; findings below may reference images not displayed]

FINDINGS: Anterior and interbody fusion changes are noted at L4-5. Stable mild
anterolisthesis of L4.
IMPRESSION: Anterior and interbody fusion changes at L4-5. Anterior and
interbody fusion changes at L4-5.

## 2021-09-28 ENCOUNTER — Ambulatory Visit (INDEPENDENT_AMBULATORY_CARE_PROVIDER_SITE_OTHER): Payer: BC Managed Care – PPO | Admitting: Family Medicine

## 2021-09-28 ENCOUNTER — Encounter: Payer: Self-pay | Admitting: Family Medicine

## 2021-09-28 VITALS — BP 117/84 | HR 80 | Temp 98.0°F | Resp 16 | Wt 241.0 lb

## 2021-09-28 DIAGNOSIS — I10 Essential (primary) hypertension: Secondary | ICD-10-CM | POA: Diagnosis not present

## 2021-09-28 DIAGNOSIS — B370 Candidal stomatitis: Secondary | ICD-10-CM

## 2021-09-28 DIAGNOSIS — R0981 Nasal congestion: Secondary | ICD-10-CM

## 2021-09-28 DIAGNOSIS — K140 Glossitis: Secondary | ICD-10-CM | POA: Diagnosis not present

## 2021-09-28 MED ORDER — NYSTATIN 100000 UNIT/ML MT SUSP: 5.0000 mL | Freq: Three times a day (TID) | OROMUCOSAL | 0 refills | Status: DC

## 2021-09-28 MED ORDER — FLUTICASONE PROPIONATE 50 MCG/ACT NA SUSP: 2.0000 | Freq: Every day | NASAL | 6 refills | Status: DC

## 2021-09-28 MED ORDER — FLUCONAZOLE 100 MG PO TABS: 100.0000 mg | ORAL_TABLET | Freq: Every day | ORAL | 0 refills | Status: DC

## 2021-09-29 ENCOUNTER — Encounter: Payer: Self-pay | Admitting: Family Medicine

## 2021-09-29 NOTE — Progress Notes (Signed)
Established Patient Office Visit  Subjective    Patient ID: [name], female    DOB: Sep 19, 1970  Age: 51 y.o. MRN: [id number]  CC:  Chief Complaint  Patient presents with   Follow-up    HPI [name] presents for complaint of continued mouth pain. She was seen previously for similar sx and she reports that the magic mouthwash was   Outpatient Encounter Medications as of 09/28/2021  Medication Sig   cetirizine (ZYRTEC) 10 MG tablet Take 10 mg by mouth daily as needed for allergies (alternating with Allegra).   fexofenadine (ALLEGRA) 60 MG tablet Take 60 mg by mouth daily as needed for allergies or rhinitis (alternating with Zyrtec).   fluconazole (DIFLUCAN) 100 MG tablet Take 1 tablet (100 mg total) by mouth daily.   FLUoxetine (PROZAC) 20 MG tablet Take 1 tablet (20 mg total) by mouth daily.   fluticasone (FLONASE) 50 MCG/ACT nasal spray Place 2 sprays into both nostrils daily.   hydrochlorothiazide (HYDRODIURIL) 25 MG tablet Take 1 tablet (25 mg total) by mouth daily.   meloxicam (MOBIC) 15 MG tablet Take 1 tablet (15 mg total) by mouth daily.   metFORMIN (GLUCOPHAGE) 1000 MG tablet Take 1 tablet (1,000 mg total) by mouth daily.   pantoprazole (PROTONIX) 40 MG tablet Take 1 tablet (40 mg total) by mouth daily.   potassium chloride SA (KLOR-CON M) 20 MEQ tablet Take 1 tablet (20 mEq total) by mouth daily.   SEMAGLUTIDE, 2 MG/DOSE, [place]    spironolactone (ALDACTONE) 25 MG tablet Take 1 tablet (25 mg total) by mouth daily.   traZODone (DESYREL) 50 MG tablet TAKE 1 TABLET(50 MG) BY MOUTH AT BEDTIME   [DISCONTINUED] magic mouthwash (nystatin, lidocaine, diphenhydrAMINE, alum & mag hydroxide) suspension Swish and spit 5 mLs 3 (three) times daily.   magic mouthwash (nystatin, lidocaine, diphenhydrAMINE, alum & mag hydroxide) suspension Swish and spit 5 mLs 3 (three) times daily.   [DISCONTINUED] phentermine 37.5 MG capsule Take 1 capsule (37.5 mg total) by mouth every morning.   No  facility-administered encounter medications on file as of 09/28/2021.    Past Medical History:  Diagnosis Date   Anemia    Arthritis    Depression    due to perimenopausal phase per pt.   GERD (gastroesophageal reflux disease)    History of hiatal hernia    Postpartum care following cesarean delivery (4/27) 07/28/2015   Pregnancy induced hypertension    during pregnancy   Sleep apnea    no machine   Varicose veins    Vitamin D deficiency     Past Surgical History:  Procedure Laterality Date   ABDOMINAL EXPOSURE N/A 04/27/2019   Procedure: ABDOMINAL EXPOSURE;  Surgeon: Chuck Hint, MD;  Location: [facility] OR;  Service: Vascular;  Laterality: N/A;   ANTERIOR LUMBAR FUSION N/A 04/27/2019   Procedure: ANTERIOR LUMBAR INTERBODY FUSION LUMBAR FOUR-FIVE WITH INSTRUMENTATION AND ALLOGRAFT;  Surgeon: Estill Bamberg, MD;  Location: [facility] OR;  Service: Orthopedics;  Laterality: N/A;   CESAREAN SECTION N/A 07/28/2015   Procedure: CESAREAN SECTION;  Surgeon: Olivia Mackie, MD;  Location: [facility];  Service: Obstetrics;  Laterality: N/A;   ESOPHAGEAL MANOMETRY N/A 01/02/2017   Procedure: ESOPHAGEAL MANOMETRY (EM);  Surgeon: Kerin Salen, MD;  Location: [facility];  Service: Gastroenterology;  Laterality: N/A;   HERNIA REPAIR     hiatal hernia repair 03-06-17   INSERTION OF MESH N/A 03/06/2017   Procedure: INSERTION OF MESH;  Surgeon: Axel Filler, MD;  Location: [facility];  Service: General;  Laterality: N/A;   TOE SURGERY Bilateral     Family History  Problem Relation Age of Onset   Heart disease Mother    Diabetes Mother    Hypertension Mother    Cancer Father    Hypertension Father    Asthma Sister    Seizures Sister    Bipolar disorder Sister     Social History   Socioeconomic History   Marital status: Married    Spouse name: Not on file   Number of children: Not on file   Years of education: Not on file   Highest education level: Not on file  Occupational History   Not on  file  Tobacco Use   Smoking status: Former    Types: Cigarettes    Quit date: 03/06/2011    Years since quitting: 10.5   Smokeless tobacco: Never  Vaping Use   Vaping Use: Never used  Substance and Sexual Activity   Alcohol use: No    Alcohol/week: 0.0 standard drinks of alcohol   Drug use: No   Sexual activity: Yes  Other Topics Concern   Not on file  Social History Narrative   ** Merged History Encounter **       Social Determinants of Health   Financial Resource Strain: Not on file  Food Insecurity: Not on file  Transportation Needs: Not on file  Physical Activity: Not on file  Stress: Not on file  Social Connections: Not on file  Intimate Partner Violence: Not on file    Review of Systems  All other systems reviewed and are negative.       Objective    BP 117/84   Pulse 80   Temp 98 F (36.7 C) (Oral)   Resp 16   Wt 241 lb (109.3 kg)   LMP  (LMP Unknown) Comment: 3 months ago  SpO2 94%   BMI 37.75 kg/m   Physical Exam Vitals and nursing note reviewed.  Constitutional:      General: She is not in acute distress. Cardiovascular:     Rate and Rhythm: Normal rate and regular rhythm.  Pulmonary:     Effort: Pulmonary effort is normal.     Breath sounds: Normal breath sounds.  Abdominal:     Palpations: Abdomen is soft.     Tenderness: There is no abdominal tenderness.  Neurological:     General: No focal deficit present.     Mental Status: She is alert and oriented to person, place, and time.        Assessment & Plan:   1. Essential hypertension Appears stable with present management. Continue and monitor.   2. Glossitis Referral to ENT for further eval/mgt - Ambulatory referral to ENT  3. Thrush, oral Diflucan prescribed. Magic mouthwash refilled.  - magic mouthwash (nystatin, lidocaine, diphenhydrAMINE, alum & mag hydroxide) suspension; Swish and spit 5 mLs 3 (three) times daily.  Dispense: 180 mL; Refill: 0  4. Sinus  congestion Flonase prescribed    No follow-ups on file.   Tommie Raymond, MD

## 2021-10-14 ENCOUNTER — Other Ambulatory Visit: Payer: Self-pay | Admitting: Family

## 2021-10-14 DIAGNOSIS — G4709 Other insomnia: Secondary | ICD-10-CM

## 2021-10-16 NOTE — Telephone Encounter (Signed)
Requested Prescriptions  Pending Prescriptions Disp Refills  . traZODone (DESYREL) 50 MG tablet [Pharmacy Med Name: TRAZODONE 50MG  TABLETS] 30 tablet 2    Sig: TAKE 1 TABLET(50 MG) BY MOUTH AT BEDTIME     Psychiatry: Antidepressants - Serotonin Modulator Passed - 10/14/2021  1:59 PM      Passed - Valid encounter within last 6 months    Recent Outpatient Visits          2 weeks ago Essential hypertension   Primary [facility] at [facility], MD   1 month ago Essential hypertension   Primary Care at [facility], MD   2 months ago Essential hypertension   Primary Care at [facility], Cari S, PA-C   3 months ago Essential hypertension   Primary Care at [facility], MD

## 2022-01-01 NOTE — Progress Notes (Unsigned)
Erroneous encounter-disregard

## 2022-01-08 ENCOUNTER — Ambulatory Visit (INDEPENDENT_AMBULATORY_CARE_PROVIDER_SITE_OTHER): Payer: BC Managed Care – PPO | Admitting: Family Medicine

## 2022-01-08 ENCOUNTER — Encounter: Payer: Self-pay | Admitting: Family Medicine

## 2022-01-08 VITALS — BP 126/86 | HR 95 | Temp 98.3°F | Resp 16 | Wt 238.0 lb

## 2022-01-08 DIAGNOSIS — Z23 Encounter for immunization: Secondary | ICD-10-CM | POA: Diagnosis not present

## 2022-01-08 DIAGNOSIS — Z01818 Encounter for other preprocedural examination: Secondary | ICD-10-CM

## 2022-01-08 DIAGNOSIS — K219 Gastro-esophageal reflux disease without esophagitis: Secondary | ICD-10-CM

## 2022-01-08 MED ORDER — HYDROCODONE-ACETAMINOPHEN 5-325 MG PO TABS: 1.0000 | ORAL_TABLET | Freq: Four times a day (QID) | ORAL | 0 refills | Status: DC | PRN

## 2022-01-08 MED ORDER — MELOXICAM 15 MG PO TABS: 15.0000 mg | ORAL_TABLET | Freq: Every day | ORAL | 1 refills | Status: DC

## 2022-01-08 MED ORDER — SPIRONOLACTONE 25 MG PO TABS: 25.0000 mg | ORAL_TABLET | Freq: Every day | ORAL | 1 refills | Status: DC

## 2022-01-08 MED ORDER — HYDROCHLOROTHIAZIDE 25 MG PO TABS: 25.0000 mg | ORAL_TABLET | Freq: Every day | ORAL | 1 refills | Status: DC

## 2022-01-08 MED ORDER — PANTOPRAZOLE SODIUM 40 MG PO TBEC: 40.0000 mg | DELAYED_RELEASE_TABLET | Freq: Every day | ORAL | 1 refills | Status: DC

## 2022-01-08 NOTE — Progress Notes (Unsigned)
Established Patient Office Visit  Subjective    Patient ID: [name], female    DOB: Jul 03, 1970  Age: 51 y.o. MRN: [id number]  CC:  Chief Complaint  Patient presents with   Medical Clearance    HPI [name] presents for medical clearance as requested by Dr Georgena Spurling for right knee replacement. Patriient denies acute complaints ro concerns.    Outpatient Encounter Medications as of 01/08/2022  Medication Sig   cetirizine (ZYRTEC) 10 MG tablet Take 10 mg by mouth daily as needed for allergies (alternating with Allegra).   fexofenadine (ALLEGRA) 60 MG tablet Take 60 mg by mouth daily as needed for allergies or rhinitis (alternating with Zyrtec).   fluconazole (DIFLUCAN) 100 MG tablet Take 1 tablet (100 mg total) by mouth daily.   FLUoxetine (PROZAC) 20 MG tablet Take 1 tablet (20 mg total) by mouth daily.   fluticasone (FLONASE) 50 MCG/ACT nasal spray Place 2 sprays into both nostrils daily.   hydrochlorothiazide (HYDRODIURIL) 25 MG tablet Take 1 tablet (25 mg total) by mouth daily.   magic mouthwash (nystatin, lidocaine, diphenhydrAMINE, alum & mag hydroxide) suspension Swish and spit 5 mLs 3 (three) times daily.   meloxicam (MOBIC) 15 MG tablet Take 1 tablet (15 mg total) by mouth daily.   metFORMIN (GLUCOPHAGE) 1000 MG tablet Take 1 tablet (1,000 mg total) by mouth daily.   pantoprazole (PROTONIX) 40 MG tablet Take 1 tablet (40 mg total) by mouth daily.   potassium chloride SA (KLOR-CON M) 20 MEQ tablet Take 1 tablet (20 mEq total) by mouth daily.   SEMAGLUTIDE, 2 MG/DOSE, [place]    spironolactone (ALDACTONE) 25 MG tablet Take 1 tablet (25 mg total) by mouth daily.   traZODone (DESYREL) 50 MG tablet TAKE 1 TABLET(50 MG) BY MOUTH AT BEDTIME   No facility-administered encounter medications on file as of 01/08/2022.    Past Medical History:  Diagnosis Date   Anemia    Arthritis    Depression    due to perimenopausal phase per pt.   GERD (gastroesophageal reflux disease)     History of hiatal hernia    Postpartum care following cesarean delivery (4/27) 07/28/2015   Pregnancy induced hypertension    during pregnancy   Sleep apnea    no machine   Varicose veins    Vitamin D deficiency     Past Surgical History:  Procedure Laterality Date   ABDOMINAL EXPOSURE N/A 04/27/2019   Procedure: ABDOMINAL EXPOSURE;  Surgeon: Chuck Hint, MD;  Location: [facility] OR;  Service: Vascular;  Laterality: N/A;   ANTERIOR LUMBAR FUSION N/A 04/27/2019   Procedure: ANTERIOR LUMBAR INTERBODY FUSION LUMBAR FOUR-FIVE WITH INSTRUMENTATION AND ALLOGRAFT;  Surgeon: Estill Bamberg, MD;  Location: [facility] OR;  Service: Orthopedics;  Laterality: N/A;   CESAREAN SECTION N/A 07/28/2015   Procedure: CESAREAN SECTION;  Surgeon: Olivia Mackie, MD;  Location: [facility];  Service: Obstetrics;  Laterality: N/A;   ESOPHAGEAL MANOMETRY N/A 01/02/2017   Procedure: ESOPHAGEAL MANOMETRY (EM);  Surgeon: Kerin Salen, MD;  Location: [facility];  Service: Gastroenterology;  Laterality: N/A;   HERNIA REPAIR     hiatal hernia repair 03-06-17   INSERTION OF MESH N/A 03/06/2017   Procedure: INSERTION OF MESH;  Surgeon: Axel Filler, MD;  Location: [facility];  Service: General;  Laterality: N/A;   TOE SURGERY Bilateral     Family History  Problem Relation Age of Onset   Heart disease Mother    Diabetes Mother    Hypertension Mother  Cancer Father    Hypertension Father    Asthma Sister    Seizures Sister    Bipolar disorder Sister     Social History   Socioeconomic History   Marital status: Married    Spouse name: Not on file   Number of children: Not on file   Years of education: Not on file   Highest education level: Not on file  Occupational History   Not on file  Tobacco Use   Smoking status: Former    Types: Cigarettes    Quit date: 03/06/2011    Years since quitting: 10.8   Smokeless tobacco: Never  Vaping Use   Vaping Use: Never used  Substance and Sexual Activity   Alcohol  use: No    Alcohol/week: 0.0 standard drinks of alcohol   Drug use: No   Sexual activity: Yes  Other Topics Concern   Not on file  Social History Narrative   ** Merged History Encounter **       Social Determinants of Health   Financial Resource Strain: Not on file  Food Insecurity: Not on file  Transportation Needs: Not on file  Physical Activity: Not on file  Stress: Not on file  Social Connections: Not on file  Intimate Partner Violence: Not on file    ROS      Objective    BP 126/86   Pulse 95   Temp 98.3 F (36.8 C)   Resp 16   Wt 238 lb (108 kg)   SpO2 97%   BMI 37.28 kg/m   Physical Exam  {Labs (Optional):23779}    Assessment & Plan:   Problem List Items Addressed This Visit   None Visit Diagnoses     Need for immunization against influenza    -  Primary   Relevant Orders   Flu Vaccine QUAD 32mo+IM (Fluarix, Fluzone & Alfiuria Quad PF) (Completed)       No follow-ups on file.   Becky Sax, MD

## 2022-01-18 ENCOUNTER — Other Ambulatory Visit: Payer: Self-pay | Admitting: *Deleted

## 2022-01-18 DIAGNOSIS — K219 Gastro-esophageal reflux disease without esophagitis: Secondary | ICD-10-CM

## 2022-01-18 MED ORDER — HYDROCHLOROTHIAZIDE 25 MG PO TABS: 25.0000 mg | ORAL_TABLET | Freq: Every day | ORAL | 1 refills | Status: DC

## 2022-01-18 MED ORDER — PANTOPRAZOLE SODIUM 40 MG PO TBEC: 40.0000 mg | DELAYED_RELEASE_TABLET | Freq: Every day | ORAL | 1 refills | Status: DC

## 2022-01-26 ENCOUNTER — Other Ambulatory Visit: Payer: Self-pay | Admitting: *Deleted

## 2022-01-26 MED ORDER — SPIRONOLACTONE 25 MG PO TABS: 25.0000 mg | ORAL_TABLET | Freq: Every day | ORAL | 1 refills | Status: DC

## 2022-02-05 ENCOUNTER — Telehealth: Payer: Self-pay | Admitting: Family Medicine

## 2022-02-05 NOTE — Telephone Encounter (Signed)
Patient came to pick up copy of form

## 2022-02-05 NOTE — Telephone Encounter (Signed)
Copied from [place] [id number]. Topic: General - Inquiry >> Feb 05, 2022  8:27 AM [name] wrote: Reason for CRM: pt would like to pick up a copy of her clearance form that has been sent to her dr twice.  Pt states since they keep saying they have not received, she would like to pick u a copy and take it to them.  Pt will be at your office at at 1 pm today if you could have that ready for her.  thanks

## 2022-02-09 ENCOUNTER — Other Ambulatory Visit: Payer: Self-pay | Admitting: *Deleted

## 2022-02-09 DIAGNOSIS — K219 Gastro-esophageal reflux disease without esophagitis: Secondary | ICD-10-CM

## 2022-02-09 MED ORDER — HYDROCHLOROTHIAZIDE 25 MG PO TABS: 25.0000 mg | ORAL_TABLET | Freq: Every day | ORAL | 1 refills | Status: DC

## 2022-02-09 MED ORDER — PANTOPRAZOLE SODIUM 40 MG PO TBEC: 40.0000 mg | DELAYED_RELEASE_TABLET | Freq: Every day | ORAL | 1 refills | Status: DC

## 2022-03-15 ENCOUNTER — Other Ambulatory Visit: Payer: Self-pay | Admitting: Family Medicine

## 2022-03-15 NOTE — Telephone Encounter (Signed)
Spoke with [facility] - Refill available. They will prepare. Requested Prescriptions  Pending Prescriptions Disp Refills   meloxicam (MOBIC) 15 MG tablet 90 tablet 1    Sig: Take 1 tablet (15 mg total) by mouth daily.     Analgesics:  COX2 Inhibitors Failed - 03/15/2022  4:08 PM      Failed - Manual Review: Labs are only required if the patient has taken medication for more than 8 weeks.      Failed - ALT in normal range and within 360 days    ALT  Date Value Ref Range Status  04/23/2019 17 0 - 44 U/L Final         Passed - HGB in normal range and within 360 days    Hemoglobin  Date Value Ref Range Status  07/27/2021 15.6 11.1 - 15.9 g/dL Final         Passed - Cr in normal range and within 360 days    Creatinine, Ser  Date Value Ref Range Status  07/27/2021 0.77 0.57 - 1.00 mg/dL Final   Creatinine, Urine  Date Value Ref Range Status  07/28/2015 110.00 mg/dL Final         Passed - HCT in normal range and within 360 days    Hematocrit  Date Value Ref Range Status  07/27/2021 45.9 34.0 - 46.6 % Final         Passed - AST in normal range and within 360 days    AST  Date Value Ref Range Status  07/27/2021 34 0 - 40 IU/L Final         Passed - eGFR is 30 or above and within 360 days    GFR calc Af Amer  Date Value Ref Range Status  04/23/2019 >60 >60 mL/min Final   GFR calc non Af Amer  Date Value Ref Range Status  04/23/2019 >60 >60 mL/min Final   eGFR  Date Value Ref Range Status  07/27/2021 94 >59 mL/min/1.73 Final         Passed - Patient is not pregnant      Passed - Valid encounter within last 12 months    Recent Outpatient Visits           2 months ago Encounter for preoperative examination for general surgical procedure   Primary Care at [facility], MD   5 months ago Essential hypertension   Primary Care at [facility], MD   6 months ago Essential hypertension   Primary Care at [facility],  MD   7 months ago Essential hypertension   Primary Care at [facility], Cari S, PA-C   8 months ago Essential hypertension   Primary Care at [facility], MD

## 2022-03-15 NOTE — Telephone Encounter (Signed)
Called pharmacy pt has refill available. [facility] will prepare refill.

## 2022-03-15 NOTE — Telephone Encounter (Signed)
Medication Refill - Medication: meloxicam (MOBIC) 15 MG tablet  Has the patient contacted their pharmacy? yes (Agent: If no, request that the patient contact the pharmacy for the refill. If patient does not wish to contact the pharmacy document the reason why and proceed with request.) (Agent: If yes, when and what did the pharmacy advise?)contact pcp  Preferred Pharmacy (with phone number or street name):  [facility] - [place], [place] - [address]   Phone: [phone]  Fax: [phone]            Has the patient been seen for an appointment in the last year OR does the patient have an upcoming appointment? yes  Agent: Please be advised that RX refills may take up to 3 business days. We ask that you follow-up with your pharmacy.

## 2022-04-03 ENCOUNTER — Ambulatory Visit (INDEPENDENT_AMBULATORY_CARE_PROVIDER_SITE_OTHER): Payer: BC Managed Care – PPO | Admitting: Family

## 2022-04-03 ENCOUNTER — Encounter: Payer: Self-pay | Admitting: Family

## 2022-04-03 ENCOUNTER — Ambulatory Visit: Payer: Self-pay | Admitting: *Deleted

## 2022-04-03 VITALS — BP 115/82 | HR 80 | Temp 98.3°F | Resp 16 | Wt 214.0 lb

## 2022-04-03 DIAGNOSIS — U071 COVID-19: Secondary | ICD-10-CM

## 2022-04-03 DIAGNOSIS — K056 Periodontal disease, unspecified: Secondary | ICD-10-CM | POA: Diagnosis not present

## 2022-04-03 DIAGNOSIS — K0510 Chronic gingivitis, plaque induced: Secondary | ICD-10-CM | POA: Diagnosis not present

## 2022-04-03 MED ORDER — PREDNISONE 10 MG PO TABS: ORAL_TABLET | ORAL | 0 refills | Status: AC

## 2022-04-03 MED ORDER — NIRMATRELVIR/RITONAVIR (PAXLOVID)TABLET: 3.0000 | ORAL_TABLET | Freq: Two times a day (BID) | ORAL | 0 refills | Status: AC

## 2022-04-03 NOTE — Telephone Encounter (Signed)
Summary: Covid Advice   Pt is calling to report that she tested positive for COVID on Sunday evening. Sx include gums swollen, loss of smell, congestion, top of head hurts to the touch, cough, mouth hurts, unable to chew. Please advise.         Reason for Disposition  [1] COVID-19 diagnosed by positive lab test (e.g., PCR, rapid self-test kit) AND [2] mild symptoms (e.g., cough, fever, others) AND [3] no complications or SOB  Answer Assessment - Initial Assessment Questions 1. COVID-19 DIAGNOSIS: "How do you know that you have COVID?" (e.g., positive lab test or self-test, diagnosed by doctor or NP/PA, symptoms after exposure).     Tested + COVID- Sunday evening 2. COVID-19 EXPOSURE: "Was there any known exposure to COVID before the symptoms began?" CDC Definition of close contact: within 6 feet (2 meters) for a total of 15 minutes or more over a 24-hour period.      Possible exposure at church 3. ONSET: "When did the COVID-19 symptoms start?"      Symptoms started-  Saturday 4. WORST SYMPTOM: "What is your worst symptom?" (e.g., cough, fever, shortness of breath, muscle aches)     Mouth/gum pain- swelling, feels like bumps in mouth 5. COUGH: "Do you have a cough?" If Yes, ask: "How bad is the cough?"       Yes- not as severe 6. FEVER: "Do you have a fever?" If Yes, ask: "What is your temperature, how was it measured, and when did it start?"     Low grade temp yesterday 7. RESPIRATORY STATUS: "Describe your breathing?" (e.g., normal; shortness of breath, wheezing, unable to speak)      Normal- just congestion 8. BETTER-SAME-WORSE: "Are you getting better, staying the same or getting worse compared to yesterday?"  If getting worse, ask, "In what way?"     Sore today- same- mouth pain 9. OTHER SYMPTOMS: "Do you have any other symptoms?"  (e.g., chills, fatigue, headache, loss of smell or taste, muscle pain, sore throat)     Headache, body ache 10. HIGH RISK DISEASE: "Do you have any chronic  medical problems?" (e.g., asthma, heart or lung disease, weak immune system, obesity, etc.)       High BP 11. VACCINE: "Have you had the COVID-19 vaccine?" If Yes, ask: "Which one, how many shots, when did you get it?"       Not current with booster  Protocols used: Coronavirus (COVID-19) Diagnosed or Suspected-A-AH

## 2022-04-03 NOTE — Telephone Encounter (Signed)
  Chief Complaint: + COVID Symptoms: body ache. Low grade temp. Mough/gums sore- heard to eat Frequency: symptoms started Saturday Pertinent Negatives: Patient denies SOB Disposition: [] ED /[] Urgent Care (no appt availability in office) / [x] Appointment(In office/virtual)/ []  [place] [facility]/ [] Home Care/ [] Refused Recommended Disposition /[] [place] [facility]/ []  Follow-up with PCP Additional Notes:

## 2022-04-03 NOTE — Progress Notes (Signed)
Patient ID: [name], female    DOB: [birth year]  MRN: [id number]  CC: Covid  Subjective: [name] is a 52 y.o. female who presents for Covid.   Her concerns today include:  04/03/2022 per triage RN note:   Chief Complaint: + COVID Symptoms: body ache. Low grade temp. Mough/gums sore- heard to eat Frequency: symptoms started Saturday Pertinent Negatives: Patient denies SOB Disposition: []ED /[]Urgent Care (no appt availability in office) / [x]Appointment(In office/virtual)/ [] [place] []Home Care/ []Refused Recommended Disposition /[][place] [facility]/ [] Follow-up with PCP Additional Notes:        Summary: Covid Advice    Pt is calling to report that she tested positive for COVID on Sunday evening. Sx include gums swollen, loss of smell, congestion, top of head hurts to the touch, cough, mouth hurts, unable to chew. Please advise.          Reason for Disposition  [1] COVID-19 diagnosed by positive lab test (e.g., PCR, rapid self-test kit) AND [2] mild symptoms (e.g., cough, fever, others) AND [7] no complications or SOB  Answer Assessment - Initial Assessment Questions 1. COVID-19 DIAGNOSIS: "How do you know that you have COVID?" (e.g., positive lab test or self-test, diagnosed by doctor or NP/PA, symptoms after exposure).     Tested + COVID- Sunday evening 2. COVID-19 EXPOSURE: "Was there any known exposure to COVID before the symptoms began?" CDC Definition of close contact: within 6 feet (2 meters) for a total of 15 minutes or more over a 24-hour period.      Possible exposure at church 3. ONSET: "When did the COVID-19 symptoms start?"      Symptoms started-  Saturday 4. WORST SYMPTOM: "What is your worst symptom?" (e.g., cough, fever, shortness of breath, muscle aches)     Mouth/gum pain- swelling, feels like bumps in mouth 5. COUGH: "Do you have a cough?" If Yes, ask: "How bad is the cough?"       Yes- not as severe 6. FEVER: "Do you have a  fever?" If Yes, ask: "What is your temperature, how was it measured, and when did it start?"     Low grade temp yesterday 7. RESPIRATORY STATUS: "Describe your breathing?" (e.g., normal; shortness of breath, wheezing, unable to speak)      Normal- just congestion 8. BETTER-SAME-WORSE: "Are you getting better, staying the same or getting worse compared to yesterday?"  If getting worse, ask, "In what way?"     Sore today- same- mouth pain 9. OTHER SYMPTOMS: "Do you have any other symptoms?"  (e.g., chills, fatigue, headache, loss of smell or taste, muscle pain, sore throat)     Headache, body ache 10. HIGH RISK DISEASE: "Do you have any chronic medical problems?" (e.g., asthma, heart or lung disease, weak immune system, obesity, etc.)       High BP 11. VACCINE: "Have you had the COVID-19 vaccine?" If Yes, ask: "Which one, how many shots, when did you get it?"       Not current with booster  Today's visit 04/03/2022: Patient's report consistent with triage RN noted call. Thinks she was exposed to Covid at church or work. Denies red flag symptoms. She does wear upper dentures where gums are swollen.  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     fexofenadine (ALLEGRA) 60 MG tablet Take 60 mg by mouth daily as needed for allergies or rhinitis (alternating with Zyrtec).     fluconazole (DIFLUCAN) 100 MG  tablet Take 1 tablet (100 mg total) by mouth daily. 15 tablet 0   FLUoxetine (PROZAC) 20 MG tablet Take 1 tablet (20 mg total) by mouth daily. 90 tablet 0   fluticasone (FLONASE) 50 MCG/ACT nasal spray Place 2 sprays into both nostrils daily. 16 g 6   hydrochlorothiazide (HYDRODIURIL) 25 MG tablet Take 1 tablet (25 mg total) by mouth daily. 90 tablet 1   HYDROcodone-acetaminophen (NORCO) 5-325 MG tablet Take 1 tablet by mouth every 6 (six) hours as needed for moderate pain. 15 tablet 0   meloxicam (MOBIC) 15 MG tablet Take 1 tablet (15 mg total) by mouth daily. 90 tablet 1   metFORMIN (GLUCOPHAGE) 1000 MG tablet Take 1 tablet (1,000 mg total) by mouth daily. 180 tablet 0   pantoprazole (PROTONIX) 40 MG tablet Take 1 tablet (40 mg total) by mouth daily. 90 tablet 1   potassium chloride SA (KLOR-CON M) 20 MEQ tablet Take 1 tablet (20 mEq total) by mouth daily. 90 tablet 0   SEMAGLUTIDE, 2 MG/DOSE, [place]      spironolactone (ALDACTONE) 25 MG tablet Take 1 tablet (25 mg total) by mouth daily. 90 tablet 1   No current facility-administered medications on file prior to visit.    Allergies  Allergen Reactions   Ibuprofen Anaphylaxis   Tramadol Nausea And Vomiting and Swelling    Social History   Socioeconomic History   Marital status: Married    Spouse name: Not on file   Number of children: Not on file   Years of education: Not on file   Highest education level: Not on file  Occupational History   Not on file  Tobacco Use   Smoking status: Former    Types: Cigarettes    Quit date: 03/06/2011    Years since quitting: 11.0   Smokeless tobacco: Never  Vaping Use   Vaping Use: Never used  Substance and Sexual Activity   Alcohol use: No    Alcohol/week: 0.0 standard drinks of alcohol   Drug use: No   Sexual activity: Yes  Other Topics Concern   Not on file  Social History Narrative   ** Merged History Encounter **       Social Determinants of Health   Financial Resource Strain:  Not on file  Food Insecurity: Not on file  Transportation Needs: Not on file  Physical Activity: Not on file  Stress: Not on file  Social Connections: Not on file  Intimate Partner Violence: Not on file    Family History  Problem Relation Age of Onset   Heart disease Mother    Diabetes Mother    Hypertension Mother    Cancer Father    Hypertension Father    Asthma Sister    Seizures Sister    Bipolar disorder Sister     Past Surgical History:  Procedure Laterality Date   ABDOMINAL EXPOSURE N/A 04/27/2019   Procedure: ABDOMINAL EXPOSURE;  Surgeon: Angelia Mould, MD;  Location: [facility] OR;  Service: Vascular;  Laterality: N/A;   ANTERIOR LUMBAR FUSION N/A 04/27/2019   Procedure: ANTERIOR LUMBAR INTERBODY FUSION LUMBAR FOUR-FIVE WITH INSTRUMENTATION AND ALLOGRAFT;  Surgeon: Phylliss Bob, MD;  Location: [place];  Service: Orthopedics;  Laterality: N/A;   CESAREAN SECTION N/A 07/28/2015   Procedure: CESAREAN SECTION;  Surgeon: Brien Few, MD;  Location: [place] [facility];  Service: Obstetrics;  Laterality: N/A;   ESOPHAGEAL MANOMETRY N/A 01/02/2017   Procedure: ESOPHAGEAL MANOMETRY (EM);  Surgeon: Ronnette Juniper, MD;  Location: [facility];  Service: Gastroenterology;  Laterality: N/A;   HERNIA REPAIR     hiatal hernia repair 03-06-17   INSERTION OF MESH N/A 03/06/2017   Procedure: INSERTION OF MESH;  Surgeon: Ralene Ok, MD;  Location: [facility];  Service: General;  Laterality: N/A;   TOE SURGERY Bilateral     ROS: Review of Systems Negative except as stated above  PHYSICAL EXAM: BP 115/82 (BP Location: Left Arm, Patient Position: Sitting, Cuff Size: Large)   Pulse 80   Temp 98.3 F (36.8 C)   Resp 16   Wt 214 lb (97.1 kg)   SpO2 98%   BMI 33.52 kg/m   Physical Exam HENT:     Head: Normocephalic and atraumatic.     Right Ear: Tympanic membrane, ear canal and external ear normal.     Left Ear: Tympanic membrane, ear canal and external ear normal.     Mouth/Throat:      Mouth: Mucous membranes are moist.     Dentition: Gingival swelling present.     Pharynx: Oropharynx is clear.  Eyes:     Extraocular Movements: Extraocular movements intact.     Conjunctiva/sclera: Conjunctivae normal.     Pupils: Pupils are equal, round, and reactive to light.  Cardiovascular:     Rate and Rhythm: Normal rate and regular rhythm.     Pulses: Normal pulses.     Heart sounds: Normal heart sounds.  Pulmonary:     Effort: Pulmonary effort is normal.     Breath sounds: Normal breath sounds.  Musculoskeletal:     Cervical back: Normal range of motion and neck supple.  Neurological:     General: No focal deficit present.     Mental Status: She is alert and oriented to person, place, and time.  Psychiatric:        Mood and Affect: Mood normal.        Behavior: Behavior normal.    ASSESSMENT AND PLAN: 1. COVID - Respiratory panel pending.  - Paxlovid as prescribed. Counseled on medication adherence.  - Follow-up with primary provider as scheduled.  - COVID-19, Flu A+B and RSV - nirmatrelvir/ritonavir (PAXLOVID) 20 x 150 MG & 10 x 100MG TABS; Take 3 tablets by mouth 2 (two) times daily for 5 days. (Take nirmatrelvir 150 mg two tablets twice daily for 5 days and ritonavir 100 mg one tablet twice daily for 5 days) Patient GFR is 94  Dispense: 30 tablet; Refill: 0  2. Gum inflammation 3. Sore gums - Prednisone as prescribed. Counseled on medication adherence.  - Follow-up with primary provider as scheduled.  - predniSONE (DELTASONE) 10 MG tablet; Take 6 tablets (60 mg total) by mouth daily with breakfast for 1 day, THEN 5 tablets (50 mg total) daily with breakfast for 1 day, THEN 4 tablets (40 mg total) daily with breakfast for 1 day, THEN 3 tablets (30 mg total) daily with breakfast for 1 day, THEN 2  tablets (20 mg total) daily with breakfast for 1 day, THEN 1 tablet (10 mg total) daily with breakfast for 1 day.  Dispense: 21 tablet; Refill: 0   Patient was given the  opportunity to ask questions.  Patient verbalized understanding of the plan and was able to repeat key elements of the plan. Patient was given clear instructions to go to Emergency Department or return to medical center if symptoms don't improve, worsen, or new problems develop.The patient verbalized understanding.   Orders Placed This Encounter  Procedures   COVID-19, Flu A+B and RSV     Requested Prescriptions   Signed Prescriptions Disp Refills   nirmatrelvir/ritonavir (PAXLOVID) 20 x 150 MG & 10 x 100MG TABS 30 tablet 0    Sig: Take 3 tablets by mouth 2 (two) times daily for 5 days. (Take nirmatrelvir 150 mg two tablets twice daily for 5 days and ritonavir 100 mg one tablet twice daily for 5 days) Patient GFR is 94   predniSONE (DELTASONE) 10 MG tablet 21 tablet 0    Sig: Take 6 tablets (60 mg total) by mouth daily with breakfast for 1 day, THEN 5 tablets (50 mg total) daily with breakfast for 1 day, THEN 4 tablets (40 mg total) daily with breakfast for 1 day, THEN 3 tablets (30 mg total) daily with breakfast for 1 day, THEN 2 tablets (20 mg total) daily with breakfast for 1 day, THEN 1 tablet (10 mg total) daily with breakfast for 1 day.    Return for Follow-Up or next available as scheduled with Dorna Mai, MD.  Camillia Herter, NP

## 2022-04-03 NOTE — Progress Notes (Signed)
Pt presents for testing positive for COVID -tested positive Sunday -symptoms include low grade fever, congestion, and runny nose

## 2022-04-03 NOTE — Patient Instructions (Signed)

## 2022-04-04 LAB — COVID-19, FLU A+B AND RSV
Influenza A, NAA: NOT DETECTED
Influenza B, NAA: NOT DETECTED
RSV, NAA: NOT DETECTED
SARS-CoV-2, NAA: DETECTED — AB

## 2022-06-12 ENCOUNTER — Other Ambulatory Visit: Payer: Self-pay | Admitting: Family Medicine

## 2022-06-13 ENCOUNTER — Other Ambulatory Visit: Payer: Self-pay | Admitting: Family Medicine

## 2022-06-13 DIAGNOSIS — K219 Gastro-esophageal reflux disease without esophagitis: Secondary | ICD-10-CM

## 2022-07-05 ENCOUNTER — Encounter: Payer: Self-pay | Admitting: Family Medicine

## 2022-07-05 ENCOUNTER — Ambulatory Visit (INDEPENDENT_AMBULATORY_CARE_PROVIDER_SITE_OTHER): Payer: BC Managed Care – PPO | Admitting: Family Medicine

## 2022-07-05 VITALS — BP 124/84 | HR 67 | Temp 98.1°F | Resp 16 | Wt 219.0 lb

## 2022-07-05 DIAGNOSIS — I10 Essential (primary) hypertension: Secondary | ICD-10-CM | POA: Diagnosis not present

## 2022-07-05 DIAGNOSIS — Z01818 Encounter for other preprocedural examination: Secondary | ICD-10-CM | POA: Diagnosis not present

## 2022-07-05 NOTE — Progress Notes (Signed)
Established Patient Office Visit  Subjective    Patient ID: [name], female    DOB: April 18, 1970  Age: 52 y.o. MRN: [id number]  CC: No chief complaint on file.   HPI [name] presents for surgical clearance for right knee replacement anticipated this summer. This was requested by Dr. Georgena Spurling.    Outpatient Encounter Medications as of 07/05/2022  Medication Sig   cetirizine (ZYRTEC) 10 MG tablet Take 10 mg by mouth daily as needed for allergies (alternating with Allegra).   fexofenadine (ALLEGRA) 60 MG tablet Take 60 mg by mouth daily as needed for allergies or rhinitis (alternating with Zyrtec).   fluconazole (DIFLUCAN) 100 MG tablet Take 1 tablet (100 mg total) by mouth daily.   FLUoxetine (PROZAC) 20 MG tablet Take 1 tablet (20 mg total) by mouth daily.   fluticasone (FLONASE) 50 MCG/ACT nasal spray Place 2 sprays into both nostrils daily.   hydrochlorothiazide (HYDRODIURIL) 25 MG tablet TAKE 1 TABLET BY MOUTH DAILY   HYDROcodone-acetaminophen (NORCO) 5-325 MG tablet Take 1 tablet by mouth every 6 (six) hours as needed for moderate pain.   meloxicam (MOBIC) 15 MG tablet Take 1 tablet (15 mg total) by mouth daily.   metFORMIN (GLUCOPHAGE) 1000 MG tablet Take 1 tablet (1,000 mg total) by mouth daily.   pantoprazole (PROTONIX) 40 MG tablet TAKE 1 TABLET BY MOUTH DAILY   potassium chloride SA (KLOR-CON M) 20 MEQ tablet Take 1 tablet (20 mEq total) by mouth daily.   SEMAGLUTIDE, 2 MG/DOSE, [place]    spironolactone (ALDACTONE) 25 MG tablet TAKE 1 TABLET BY MOUTH DAILY   No facility-administered encounter medications on file as of 07/05/2022.    Past Medical History:  Diagnosis Date   Anemia    Arthritis    Depression    due to perimenopausal phase per pt.   GERD (gastroesophageal reflux disease)    History of hiatal hernia    Postpartum care following cesarean delivery (4/27) 07/28/2015   Pregnancy induced hypertension    during pregnancy   Sleep apnea    no machine    Varicose veins    Vitamin D deficiency     Past Surgical History:  Procedure Laterality Date   ABDOMINAL EXPOSURE N/A 04/27/2019   Procedure: ABDOMINAL EXPOSURE;  Surgeon: Chuck Hint, MD;  Location: [facility] OR;  Service: Vascular;  Laterality: N/A;   ANTERIOR LUMBAR FUSION N/A 04/27/2019   Procedure: ANTERIOR LUMBAR INTERBODY FUSION LUMBAR FOUR-FIVE WITH INSTRUMENTATION AND ALLOGRAFT;  Surgeon: Estill Bamberg, MD;  Location: [facility] OR;  Service: Orthopedics;  Laterality: N/A;   CESAREAN SECTION N/A 07/28/2015   Procedure: CESAREAN SECTION;  Surgeon: Olivia Mackie, MD;  Location: [facility] ORS;  Service: Obstetrics;  Laterality: N/A;   ESOPHAGEAL MANOMETRY N/A 01/02/2017   Procedure: ESOPHAGEAL MANOMETRY (EM);  Surgeon: Kerin Salen, MD;  Location: [facility];  Service: Gastroenterology;  Laterality: N/A;   HERNIA REPAIR     hiatal hernia repair 03-06-17   INSERTION OF MESH N/A 03/06/2017   Procedure: INSERTION OF MESH;  Surgeon: Axel Filler, MD;  Location: [facility] ORS;  Service: General;  Laterality: N/A;   TOE SURGERY Bilateral     Family History  Problem Relation Age of Onset   Heart disease Mother    Diabetes Mother    Hypertension Mother    Cancer Father    Hypertension Father    Asthma Sister    Seizures Sister    Bipolar disorder Sister     Social History  Socioeconomic History   Marital status: Married    Spouse name: Not on file   Number of children: Not on file   Years of education: Not on file   Highest education level: Not on file  Occupational History   Not on file  Tobacco Use   Smoking status: Former    Types: Cigarettes    Quit date: 03/06/2011    Years since quitting: 11.3   Smokeless tobacco: Never  Vaping Use   Vaping Use: Never used  Substance and Sexual Activity   Alcohol use: No    Alcohol/week: 0.0 standard drinks of alcohol   Drug use: No   Sexual activity: Yes  Other Topics Concern   Not on file  Social History Narrative   ** Merged History  Encounter **       Social Determinants of Health   Financial Resource Strain: Not on file  Food Insecurity: Not on file  Transportation Needs: Not on file  Physical Activity: Not on file  Stress: Not on file  Social Connections: Not on file  Intimate Partner Violence: Not on file    Review of Systems  All other systems reviewed and are negative.       Objective    BP 124/84   Pulse 67   Temp 98.1 F (36.7 C) (Oral)   Resp 16   Wt 219 lb (99.3 kg)   SpO2 96%   BMI 34.30 kg/m   Physical Exam Vitals and nursing note reviewed.  Constitutional:      General: She is not in acute distress. Cardiovascular:     Rate and Rhythm: Normal rate and regular rhythm.  Pulmonary:     Effort: Pulmonary effort is normal.     Breath sounds: Normal breath sounds.  Abdominal:     Palpations: Abdomen is soft.     Tenderness: There is no abdominal tenderness.  Musculoskeletal:     Right knee: No deformity. Decreased range of motion. Tenderness present.  Neurological:     General: No focal deficit present.     Mental Status: She is alert and oriented to person, place, and time.         Assessment & Plan:   1. Encounter for preoperative examination for general surgical procedure Patient appears medically stable for the stated procedure.   2. Essential hypertension Appears stable   Return if symptoms worsen or fail to improve.   Tommie Raymond, MD

## 2022-07-09 ENCOUNTER — Encounter: Payer: Self-pay | Admitting: Family Medicine

## 2022-07-12 ENCOUNTER — Telehealth: Payer: Self-pay | Admitting: Family Medicine

## 2022-07-12 NOTE — Telephone Encounter (Signed)
Copied from CRM [id number]. Topic: General - Other >> Jul 11, 2022  4:26 PM Turkey B wrote: Reason for CRM: Patient, called in about getting lab orders done, that  is said she needs to have before her knee surgery

## 2022-07-12 NOTE — Telephone Encounter (Signed)
schedule

## 2022-07-18 ENCOUNTER — Other Ambulatory Visit: Payer: BC Managed Care – PPO | Admitting: Family Medicine

## 2022-07-18 DIAGNOSIS — Z01810 Encounter for preprocedural cardiovascular examination: Secondary | ICD-10-CM

## 2022-07-18 NOTE — Progress Notes (Signed)
Patient came in for pre-op labs for surgery

## 2022-07-19 LAB — CBC WITH DIFFERENTIAL/PLATELET
Basophils Absolute: 0 10*3/uL (ref 0.0–0.2)
Basos: 1 %
EOS (ABSOLUTE): 0.1 10*3/uL (ref 0.0–0.4)
Eos: 2 %
Hematocrit: 44 % (ref 34.0–46.6)
Hemoglobin: 14.4 g/dL (ref 11.1–15.9)
Immature Grans (Abs): 0 10*3/uL (ref 0.0–0.1)
Immature Granulocytes: 1 %
Lymphocytes Absolute: 2 10*3/uL (ref 0.7–3.1)
Lymphs: 33 %
MCH: 31.1 pg (ref 26.6–33.0)
MCHC: 32.7 g/dL (ref 31.5–35.7)
MCV: 95 fL (ref 79–97)
Monocytes Absolute: 0.3 10*3/uL (ref 0.1–0.9)
Monocytes: 5 %
Neutrophils Absolute: 3.7 10*3/uL (ref 1.4–7.0)
Neutrophils: 58 %
Platelets: 267 10*3/uL (ref 150–450)
RBC: 4.63 x10E6/uL (ref 3.77–5.28)
RDW: 13 % (ref 11.7–15.4)
WBC: 6.2 10*3/uL (ref 3.4–10.8)

## 2022-07-19 LAB — BASIC METABOLIC PANEL
BUN/Creatinine Ratio: 19 (ref 9–23)
BUN: 14 mg/dL (ref 6–24)
CO2: 24 mmol/L (ref 20–29)
Calcium: 9.5 mg/dL (ref 8.7–10.2)
Chloride: 104 mmol/L (ref 96–106)
Creatinine, Ser: 0.74 mg/dL (ref 0.57–1.00)
Glucose: 75 mg/dL (ref 70–99)
Potassium: 4.6 mmol/L (ref 3.5–5.2)
Sodium: 146 mmol/L — ABNORMAL HIGH (ref 134–144)
eGFR: 98 mL/min/{1.73_m2} (ref >59)

## 2022-07-30 ENCOUNTER — Encounter: Payer: Self-pay | Admitting: Family Medicine

## 2022-08-30 ENCOUNTER — Ambulatory Visit (INDEPENDENT_AMBULATORY_CARE_PROVIDER_SITE_OTHER): Payer: BC Managed Care – PPO | Admitting: Cardiovascular Disease

## 2022-08-30 ENCOUNTER — Encounter (HOSPITAL_BASED_OUTPATIENT_CLINIC_OR_DEPARTMENT_OTHER): Payer: Self-pay | Admitting: Cardiovascular Disease

## 2022-08-30 VITALS — BP 146/84 | HR 69 | Ht 67.0 in | Wt 221.1 lb

## 2022-08-30 DIAGNOSIS — R400 Somnolence: Secondary | ICD-10-CM

## 2022-08-30 DIAGNOSIS — R0683 Snoring: Secondary | ICD-10-CM | POA: Diagnosis not present

## 2022-08-30 DIAGNOSIS — Z8759 Personal history of other complications of pregnancy, childbirth and the puerperium: Secondary | ICD-10-CM

## 2022-08-30 DIAGNOSIS — F17200 Nicotine dependence, unspecified, uncomplicated: Secondary | ICD-10-CM

## 2022-08-30 DIAGNOSIS — Z5181 Encounter for therapeutic drug level monitoring: Secondary | ICD-10-CM

## 2022-08-30 DIAGNOSIS — O1490 Unspecified pre-eclampsia, unspecified trimester: Secondary | ICD-10-CM

## 2022-08-30 DIAGNOSIS — Z13220 Encounter for screening for lipoid disorders: Secondary | ICD-10-CM

## 2022-08-30 DIAGNOSIS — I10 Essential (primary) hypertension: Secondary | ICD-10-CM

## 2022-08-30 HISTORY — DX: Essential (primary) hypertension: I10

## 2022-08-30 MED ORDER — VALSARTAN 80 MG PO TABS: 80.0000 mg | ORAL_TABLET | Freq: Every day | ORAL | 3 refills | Status: DC

## 2022-08-30 NOTE — Patient Instructions (Addendum)
Medication Instructions:  START VALSARTAN 80 MG DAILY    Labwork: FASTLING LP/CMET/LPa IN 1 WEEK    Testing/Procedures: [facility] HOME SLEEP STUDY   CALCIUM SCORE - THIS WILL COST YOU $99 OUT OF POCKET    Follow-Up: 09/28/2022 8:30 am with Pharm D    Special Instructions:  MONITOR YOUR BLOOD PRESSURE TWICE A DAY, LOG IN THE BOOK PROVIDED. BRING THE BOOK AND YOUR BLOOD PRESSURE MACHINE TO YOUR FOLLOW UP IN 1 MONTH   WatchPAT?  Is a FDA cleared portable home sleep study test that uses a watch and 3 points of contact to monitor 7 different channels, including your heart rate, oxygen saturations, body position, snoring, and chest motion.  The study is easy to use from the comfort of your own home and accurately detect sleep apnea.  Before bed, you attach the chest sensor, attached the sleep apnea bracelet to your nondominant hand, and attach the finger probe.  After the study, the raw data is downloaded from the watch and scored for apnea events.   For more information: [email or web address]  Patient Testing Instructions:  Do not put battery into the device until bedtime when you are ready to begin the test. Please call the support number if you need assistance after following the instructions below: 24 hour support line- [phone] or [facility] support at [phone] (option 2)  Download the IntelWatchPAT One" app through the google play store or App Store  Be sure to turn on or enable access to bluetooth in settlings on your smartphone/ device  Make sure no other bluetooth devices are on and within the vicinity of your smartphone/ device and WatchPAT watch during testing.  Make sure to leave your smart phone/ device plugged in and charging all night.  When ready for bed:  Follow the instructions step by step in the WatchPAT One App to activate the testing device. For additional instructions, including video instruction, visit the WatchPAT One video on Youtube. You can  search for WatchPat One within Youtube (video is 4 minutes and 18 seconds) or enter: https://youtube/watch?v=BCce_vbiwxE Please note: You will be prompted to enter a Pin to connect via bluetooth when starting the test. The PIN will be assigned to you when you receive the test.  The device is disposable, but it recommended that you retain the device until you receive a call letting you know the study has been received and the results have been interpreted.  We will let you know if the study did not transmit to [place] properly after the test is completed. You do not need to call us to confirm the receipt of the test.  Please complete the test within 48 hours of receiving PIN.   Frequently Asked Questions:  What is Watch Dennie Bible one?  A single use fully disposable home sleep apnea testing device and will not need to be returned after completion.  What are the requirements to use WatchPAT one?  The be able to have a successful watchpat one sleep study, you should have your Watch pat one device, your smart phone, watch pat one app, your PIN number and Internet access What type of phone do I need?  You should have a smart phone that uses Android 5.1 and above or any Iphone with IOS 10 and above How can I download the WatchPAT one app?  Based on your device type search for WatchPAT one app either in google play for android devices or APP store for Iphone's Where will I get my PIN  for the study?  Your PIN will be provided by your physician's office. It is used for authentication and if you lose/forget your PIN, please reach out to your providers office.  I do not have Internet at home. Can I do WatchPAT one study?  WatchPAT One needs Internet connection throughout the night to be able to transmit the sleep data. You can use your home/local internet or your cellular's data package. However, it is always recommended to use home/local Internet. It is estimated that between 20MB-30MB will be used with each  study.However, the application will be looking for space in the phone to start the study.  What happens if I lose internet or bluetooth connection?  During the internet disconnection, your phone will not be able to transmit the sleep data. All the data, will be stored in your phone. As soon as the internet connection is back on, the phone will being sending the sleep data. During the bluetooth disconnection, WatchPAT one will not be able to to send the sleep data to your phone. Data will be kept in the [facility] one until two devices have bluetooth connection back on. As soon as the connection is back on, WatchPAT one will send the sleep data to the phone.  How long do I need to wear the WatchPAT one?  After you start the study, you should wear the device at least 6 hours.  How far should I keep my phone from the device?  During the night, your phone should be within 15 feet.  What happens if I leave the room for restroom or other reasons?  Leaving the room for any reason will not cause any problem. As soon as your get back to the room, both devices will reconnect and will continue to send the sleep data. Can I use my phone during the sleep study?  Yes, you can use your phone as usual during the study. But it is recommended to put your watchpat one on when you are ready to go to bed.  How will I get my study results?  A soon as you completed your study, your sleep data will be sent to the provider. They will then share the results with you when they are ready.

## 2022-08-30 NOTE — Progress Notes (Deleted)
Advanced Hypertension Clinic Initial Assessment:    Date:  08/30/2022   ID:  [name], DOB 1971-03-15, MRN [id number]  PCP:  Georganna Skeans, MD  Cardiologist:  None  Nephrologist:  Referring MD: Georganna Skeans, MD   CC: Hypertension  History of Present Illness:    [name] is a 52 y.o. female with a hx of pregnancy-induced hypertension, OSA, and GERD here to establish care in the Advanced Hypertension Clinic.    Previous antihypertensives:  Secondary Causes of Hypertension  Medications/Herbal: OCP, steroids, stimulants, antidepressants, weight loss medication, immune suppressants, NSAIDs, sympathomimetics, alcohol, caffeine, licorice, ginseng, St. John's wort, chemo  Sleep Apnea Renal artery stenosis Hyperaldosteronism Hyper/hypothyroidism Pheochromocytoma: palpitations, tachycardia, headache, diaphoresis (plasma metanephrines) Cushing's syndrome: Cushingoid facies, central obesity, proximal muscle weakness, and ecchymoses, adrenal incidentaloma (cortisol) Coarctation of the aorta  Past Medical History:  Diagnosis Date   Anemia    Arthritis    Depression    due to perimenopausal phase per pt.   GERD (gastroesophageal reflux disease)    History of hiatal hernia    Postpartum care following cesarean delivery (4/27) 07/28/2015   Pregnancy induced hypertension    during pregnancy   Sleep apnea    no machine   Varicose veins    Vitamin D deficiency     Past Surgical History:  Procedure Laterality Date   ABDOMINAL EXPOSURE N/A 04/27/2019   Procedure: ABDOMINAL EXPOSURE;  Surgeon: Chuck Hint, MD;  Location: [facility] OR;  Service: Vascular;  Laterality: N/A;   ANTERIOR LUMBAR FUSION N/A 04/27/2019   Procedure: ANTERIOR LUMBAR INTERBODY FUSION LUMBAR FOUR-FIVE WITH INSTRUMENTATION AND ALLOGRAFT;  Surgeon: Estill Bamberg, MD;  Location: [facility] OR;  Service: Orthopedics;  Laterality: N/A;   CESAREAN SECTION N/A 07/28/2015   Procedure: CESAREAN SECTION;  Surgeon:  Olivia Mackie, MD;  Location: [facility] ORS;  Service: Obstetrics;  Laterality: N/A;   ESOPHAGEAL MANOMETRY N/A 01/02/2017   Procedure: ESOPHAGEAL MANOMETRY (EM);  Surgeon: Kerin Salen, MD;  Location: [facility];  Service: Gastroenterology;  Laterality: N/A;   HERNIA REPAIR     hiatal hernia repair 03-06-17   INSERTION OF MESH N/A 03/06/2017   Procedure: INSERTION OF MESH;  Surgeon: Axel Filler, MD;  Location: [facility];  Service: General;  Laterality: N/A;   TOE SURGERY Bilateral     Current Medications: No outpatient medications have been marked as taking for the 08/30/22 encounter (Appointment) with Chilton Si, MD.     Allergies:   Ibuprofen and Tramadol   Social History   Socioeconomic History   Marital status: Married    Spouse name: Not on file   Number of children: Not on file   Years of education: Not on file   Highest education level: Not on file  Occupational History   Not on file  Tobacco Use   Smoking status: Former    Types: Cigarettes    Quit date: 03/06/2011    Years since quitting: 11.4   Smokeless tobacco: Never  Vaping Use   Vaping Use: Never used  Substance and Sexual Activity   Alcohol use: No    Alcohol/week: 0.0 standard drinks of alcohol   Drug use: No   Sexual activity: Yes  Other Topics Concern   Not on file  Social History Narrative   ** Merged History Encounter **       Social Determinants of Health   Financial Resource Strain: Not on file  Food Insecurity: Not on file  Transportation Needs: Not on file  Physical Activity:  Not on file  Stress: Not on file  Social Connections: Not on file     Family History: The patient's ***family history includes Asthma in her sister; Bipolar disorder in her sister; Cancer in her father; Diabetes in her mother; Heart disease in her mother; Hypertension in her father and mother; Seizures in her sister.  ROS:   Please see the history of present illness.    *** All other systems reviewed and are  negative.  EKGs/Labs/Other Studies Reviewed:    EKG:  EKG is *** ordered today.  The ekg ordered today demonstrates ***  Recent Labs: 07/18/2022: BUN 14; Creatinine, Ser 0.74; Hemoglobin 14.4; Platelets 267; Potassium 4.6; Sodium 146   Recent Lipid Panel    Component Value Date/Time   CHOL  08/22/2008 0536    116        ATP III CLASSIFICATION:  <200     mg/dL   Desirable  409-811  mg/dL   Borderline High  >=914    mg/dL   High          TRIG 62 08/22/2008 0536   HDL 27 (L) 08/22/2008 0536   CHOLHDL 4.3 08/22/2008 0536   VLDL 12 08/22/2008 0536   LDLCALC  08/22/2008 0536    77        Total Cholesterol/HDL:CHD Risk Coronary Heart Disease Risk Table                     Men   Women  1/2 Average Risk   3.4   3.3  Average Risk       5.0   4.4  2 X Average Risk   9.6   7.1  3 X Average Risk  23.4   11.0        Use the calculated Patient Ratio above and the CHD Risk Table to determine the patient's CHD Risk.        ATP III CLASSIFICATION (LDL):  <100     mg/dL   Optimal  782-956  mg/dL   Near or Above                    Optimal  130-159  mg/dL   Borderline  213-086  mg/dL   High  >578     mg/dL   Very High    Physical Exam:   VS:  There were no vitals taken for this visit. , BMI There is no height or weight on file to calculate BMI. GENERAL:  Well appearing HEENT: Pupils equal round and reactive, fundi not visualized, oral mucosa unremarkable NECK:  No jugular venous distention, waveform within normal limits, carotid upstroke brisk and symmetric, no bruits, no thyromegaly LYMPHATICS:  No cervical adenopathy LUNGS:  Clear to auscultation bilaterally HEART:  RRR.  PMI not displaced or sustained,S1 and S2 within normal limits, no S3, no S4, no clicks, no rubs, *** murmurs ABD:  Flat, positive bowel sounds normal in frequency in pitch, no bruits, no rebound, no guarding, no midline pulsatile mass, no hepatomegaly, no splenomegaly EXT:  2 plus pulses throughout, no edema, no  cyanosis no clubbing SKIN:  No rashes no nodules NEURO:  Cranial nerves II through XII grossly intact, motor grossly intact throughout PSYCH:  Cognitively intact, oriented to person place and time   ASSESSMENT/PLAN:    No problem-specific Assessment & Plan notes found for this encounter.   Screening for Secondary Hypertension: { Click here to document screening for secondary causes of HTN  :  [id number]}    Relevant Labs/Studies:    Latest Ref Rng & Units 07/18/2022    9:11 AM 07/27/2021    4:26 PM 04/23/2019    9:41 AM  Basic Labs  Sodium 134 - 144 mmol/L 146  140  137   Potassium 3.5 - 5.2 mmol/L 4.6  3.1  3.7   Creatinine 0.57 - 1.00 mg/dL 7.82  9.56  2.13        Latest Ref Rng & Units 07/27/2021    4:26 PM 08/22/2008    5:36 AM  Thyroid   TSH 0.450 - 4.500 uIU/mL 1.310  0.765 ***Test methodology is 3rd generation TSH***        Disposition:    FU with MD/PharmD in {gen number [id number]} {Days to years:10300}    Medication Adjustments/Labs and Tests Ordered: Current medicines are reviewed at length with the patient today.  Concerns regarding medicines are outlined above.  No orders of the defined types were placed in this encounter.  No orders of the defined types were placed in this encounter.    Signed, Chilton Si, MD  08/30/2022 8:05 AM    [facility]

## 2022-08-30 NOTE — Progress Notes (Signed)
Advanced Hypertension Clinic Initial Assessment:    Date:  08/30/2022   ID:  [name], DOB 1970-09-30, MRN [id number]  PCP:  Georganna Skeans, MD  Cardiologist:  None  Nephrologist:  Referring MD: Georganna Skeans, MD   CC: Hypertension  History of Present Illness:    [name] is a 52 y.o. female with a hx of hypertension, pregnancy-induced hypertension, OSA, and GERD here to establish care in the Advanced Hypertension Clinic.   Today, she confirms having hypertension since her pregnancy in 2017, complicated by preeclampsia. Her now 45 year old son had been delivered 1 week early. Ever since then she has struggled with controlling her blood pressures. She was not hypertensive prior to her pregnancy. Initially she had started on spironolactone, and later added HCTZ which also helps her swelling tremendously. She complains of bilateral LE swelling that has been ongoing for most of her life. Notes that there is a significant maternal family history of swelling issues. At home, she is monitoring her blood pressures occasionally. Sometimes her readings are up to 180/102 at the highest. She is not certain of her average blood pressures, but states her diastolic pressure is usually close to 85. In clinic today her blood pressure was initially 137/90, and 146/84 on recheck. She has upcoming right knee surgery scheduled for August 12th. Therefore she has not been exercising much lately due to her knee pain. 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 For exercise she does walk from her house to the top of the road and back, maybe 3 days a week. Afterwards she feels fatigued and breathes a little harder which she attributes to deconditioning. She denies anginal symptoms. Mostly she cooks her meals at home and monitors her sodium intake. Lately she hasn't had any caffeine in 2 months. Typically she drinks lemonade and water. She doesn't consume  alcohol; no smoking history. Current supplements include vitamin D. For pain management she is taking tylenol 4-5 times a week for her knee pain. At this time she is also on 2 mg Semaglutide. She believes her snoring was worse prior to her weight loss. She still feels tired when she wakes up in the mornings, but doesn't easily fall asleep during the day. She denies any palpitations, chest pain, lightheadedness, headaches, syncope, orthopnea, or PND.  Previous antihypertensives: N/a  Past Medical History:  Diagnosis Date   Anemia    Arthritis    Depression    due to perimenopausal phase per pt.   Essential hypertension 08/30/2022   GERD (gastroesophageal reflux disease)    History of hiatal hernia    Postpartum care following cesarean delivery (4/27) 07/28/2015   Pregnancy induced hypertension    during pregnancy   Sleep apnea    no machine   Varicose veins    Vitamin D deficiency     Past Surgical History:  Procedure Laterality Date   ABDOMINAL EXPOSURE N/A 04/27/2019   Procedure: ABDOMINAL EXPOSURE;  Surgeon: Chuck Hint, MD;  Location: [facility] OR;  Service: Vascular;  Laterality: N/A;   ANTERIOR LUMBAR FUSION N/A 04/27/2019   Procedure: ANTERIOR LUMBAR INTERBODY FUSION LUMBAR FOUR-FIVE WITH INSTRUMENTATION AND ALLOGRAFT;  Surgeon: Estill Bamberg, MD;  Location: [facility] OR;  Service: Orthopedics;  Laterality: N/A;   CESAREAN SECTION N/A 07/28/2015   Procedure: CESAREAN SECTION;  Surgeon: Olivia Mackie, MD;  Location: [facility] ORS;  Service: Obstetrics;  Laterality: N/A;   ESOPHAGEAL MANOMETRY N/A 01/02/2017  Procedure: ESOPHAGEAL MANOMETRY (EM);  Surgeon: Kerin Salen, MD;  Location: [facility];  Service: Gastroenterology;  Laterality: N/A;   HERNIA REPAIR     hiatal hernia repair 03-06-17   INSERTION OF MESH N/A 03/06/2017   Procedure: INSERTION OF MESH;  Surgeon: Axel Filler, MD;  Location: [facility] ORS;  Service: General;  Laterality: N/A;   TOE SURGERY Bilateral     Current  Medications: Current Meds  Medication Sig   cetirizine (ZYRTEC) 10 MG tablet Take 10 mg by mouth daily as needed for allergies (alternating with Allegra).   fexofenadine (ALLEGRA) 60 MG tablet Take 60 mg by mouth daily as needed for allergies or rhinitis (alternating with Zyrtec).   hydrochlorothiazide (HYDRODIURIL) 25 MG tablet TAKE 1 TABLET BY MOUTH DAILY   linaclotide (LINZESS) 290 MCG CAPS capsule Take 290 mcg by mouth daily before breakfast.   liothyronine (CYTOMEL) 5 MCG tablet Take 5 mcg by mouth daily.   meloxicam (MOBIC) 15 MG tablet Take 1 tablet (15 mg total) by mouth daily.   pantoprazole (PROTONIX) 40 MG tablet TAKE 1 TABLET BY MOUTH DAILY   SEMAGLUTIDE, 2 MG/DOSE, [place]    spironolactone (ALDACTONE) 25 MG tablet TAKE 1 TABLET BY MOUTH DAILY   [DISCONTINUED] valsartan (DIOVAN) 80 MG tablet Take 1 tablet (80 mg total) by mouth daily.     Allergies:   Ibuprofen and Tramadol   Social History   Socioeconomic History   Marital status: Married    Spouse name: Not on file   Number of children: Not on file   Years of education: Not on file   Highest education level: Not on file  Occupational History   Not on file  Tobacco Use   Smoking status: Former    Types: Cigarettes    Quit date: 03/06/2011    Years since quitting: 11.4   Smokeless tobacco: Never  Vaping Use   Vaping Use: Never used  Substance and Sexual Activity   Alcohol use: No    Alcohol/week: 0.0 standard drinks of alcohol   Drug use: No   Sexual activity: Yes  Other Topics Concern   Not on file  Social History Narrative   ** Merged History Encounter **       Social Determinants of Health   Financial Resource Strain: Not on file  Food Insecurity: No Food Insecurity (08/30/2022)   Hunger Vital Sign    Worried About Running Out of Food in the Last Year: Never true    Ran Out of Food in the Last Year: Never true  Transportation Needs: No Transportation Needs (08/30/2022)   PRAPARE - Doctor, general practice (Medical): No    Lack of Transportation (Non-Medical): No  Physical Activity: Inactive (08/30/2022)   Exercise Vital Sign    Days of Exercise per Week: 0 days    Minutes of Exercise per Session: 0 min  Stress: Not on file  Social Connections: Not on file     Family History: The patient's family history includes Asthma in her sister; Bipolar disorder in her sister; Cancer in her father; Diabetes in her mother; Heart attack (age of onset: 47) in her mother; Heart disease in her mother; Hypertension in her brother, father, mother, and sister; Seizures in her sister.  ROS:   Please see the history of present illness.    (+) Bilateral knee pain (+) Chronic LE edema (+) Exertional fatigue and mild DOE (+) Snoring All other systems reviewed and are negative.  EKGs/Labs/Other Studies  Reviewed:     EKG:  EKG is personally reviewed. 08/30/2022: Sinus rhythm. Rate 69 bpm.  Recent Labs: 07/18/2022: BUN 14; Creatinine, Ser 0.74; Hemoglobin 14.4; Platelets 267; Potassium 4.6; Sodium 146   Recent Lipid Panel    Component Value Date/Time   CHOL  08/22/2008 0536    116        ATP III CLASSIFICATION:  <200     mg/dL   Desirable  161-096  mg/dL   Borderline High  >=045    mg/dL   High          TRIG 62 08/22/2008 0536   HDL 27 (L) 08/22/2008 0536   CHOLHDL 4.3 08/22/2008 0536   VLDL 12 08/22/2008 0536   LDLCALC  08/22/2008 0536    77        Total Cholesterol/HDL:CHD Risk Coronary Heart Disease Risk Table                     Men   Women  1/2 Average Risk   3.4   3.3  Average Risk       5.0   4.4  2 X Average Risk   9.6   7.1  3 X Average Risk  23.4   11.0        Use the calculated Patient Ratio above and the CHD Risk Table to determine the patient's CHD Risk.        ATP III CLASSIFICATION (LDL):  <100     mg/dL   Optimal  409-811  mg/dL   Near or Above                    Optimal  130-159  mg/dL   Borderline  914-782  mg/dL   High  >956     mg/dL   Very High     Physical Exam:    VS:  BP (!) 146/84 (BP Location: Right Arm, Patient Position: Sitting, Cuff Size: Large)   Pulse 69   Ht 5\' 7"  (1.702 m)   Wt 221 lb 1.9 oz (100.3 kg)   BMI 34.63 kg/m  , BMI Body mass index is 34.63 kg/m. GENERAL:  Well appearing HEENT: Pupils equal round and reactive, fundi not visualized, oral mucosa unremarkable NECK:  No jugular venous distention, waveform within normal limits, carotid upstroke brisk and symmetric, no bruits, no thyromegaly LUNGS:  Clear to auscultation bilaterally HEART:  RRR.  PMI not displaced or sustained,S1 and S2 within normal limits, no S3, no S4, no clicks, no rubs, no murmurs ABD:  Flat, positive bowel sounds normal in frequency in pitch, no bruits, no rebound, no guarding, no midline pulsatile mass, no hepatomegaly, no splenomegaly EXT:  2 plus pulses throughout, no edema, no cyanosis, no clubbing SKIN:  No rashes, no nodules NEURO:  Cranial nerves II through XII grossly intact, motor grossly intact throughout PSYCH:  Cognitively intact, oriented to person place and time   ASSESSMENT/PLAN:    No problem-specific Assessment & Plan notes found for this encounter.   # Hypertension:   BP uncontrolled both here and at home.   - Continue spironolactone and hydrochlorothiazide - Add Valsartan 80mg  daily.  Check lipids and CMP in one week.  - She will consider remote patient monitoring for more frequent blood pressure assessment and medication adjustments - Work on increasing exercise to at least 150 minutes weekly. - Will get an Itamar sleep study to evaluate for sleep apnea.  +snoring and daytime somnolence  #  Knee pain and upcoming surgery: - Continue Tylenol for pain management as needed - No need for additional cardiology clearance for knee surgery.  She can achieve 4 METS without CP or SOB. - Encourage patient to consider water-based exercises after surgery for joint-friendly cardio  # Family history of premature  cardiovascular disease: - She will get a coronary calcium score - Check LP(a)    Screening for Secondary Hypertension:     08/30/2022   10:27 AM  Causes  Drugs/Herbals Screened     - Comments limits salt.  no caffeine.  no EtOH.  No NSAIDS.  Renovascular HTN Screened     - Comments check renal Dopplers  Sleep Apnea Screened     - Comments snoring, daytime somnolence  Thyroid Disease Screened  Hyperaldosteronism N/A  Pheochromocytoma N/A  Cushing's Syndrome N/A  Hyperparathyroidism N/A  Coarctation of the Aorta Screened     - Comments BP symmetric  Compliance Screened    Relevant Labs/Studies:    Latest Ref Rng & Units 07/18/2022    9:11 AM 07/27/2021    4:26 PM 04/23/2019    9:41 AM  Basic Labs  Sodium 134 - 144 mmol/L 146  140  137   Potassium 3.5 - 5.2 mmol/L 4.6  3.1  3.7   Creatinine 0.57 - 1.00 mg/dL 1.61  0.96  0.45        Disposition:    FU with APP/PharmD in 1 month for the next 3 months.   FU with Tani Virgo C. Duke Salvia, MD, [facility] in 4 months.  Medication Adjustments/Labs and Tests Ordered: Current medicines are reviewed at length with the patient today.  Concerns regarding medicines are outlined above.   Orders Placed This Encounter  Procedures   CT CARDIAC SCORING (SELF PAY ONLY)   Lipid panel   Comprehensive metabolic panel   Lipoprotein A (LPA)   EKG 12-Lead   Itamar Sleep Study   Meds ordered this encounter  Medications   DISCONTD: valsartan (DIOVAN) 80 MG tablet    Sig: Take 1 tablet (80 mg total) by mouth daily.    Dispense:  90 tablet    Refill:  3   valsartan (DIOVAN) 80 MG tablet    Sig: Take 1 tablet (80 mg total) by mouth daily.    Dispense:  90 tablet    Refill:  3   I,Mathew Stumpf,acting as a scribe for Chilton Si, MD.,have documented all relevant documentation on the behalf of Chilton Si, MD,as directed by  Chilton Si, MD while in the presence of Chilton Si, MD.  I, Lantz Hermann C. Duke Salvia, MD have reviewed all  documentation for this visit.  The documentation of the exam, diagnosis, procedures, and orders on 08/30/2022 are all accurate and complete.   Signed, Chilton Si, MD  08/30/2022 12:19 PM    [facility]

## 2022-09-05 ENCOUNTER — Ambulatory Visit
Admission: RE | Admit: 2022-09-05 | Discharge: 2022-09-05 | Disposition: A | Discharge status: Home / Self Care | Payer: BC Managed Care – PPO | Source: Ambulatory Visit | Attending: Cardiovascular Disease | Admitting: Cardiovascular Disease

## 2022-09-05 DIAGNOSIS — O1490 Unspecified pre-eclampsia, unspecified trimester: Secondary | ICD-10-CM | POA: Insufficient documentation

## 2022-09-11 LAB — LIPID PANEL
Chol/HDL Ratio: 3 ratio (ref 0.0–4.4)
Cholesterol, Total: 248 mg/dL — ABNORMAL HIGH (ref 100–199)
HDL: 83 mg/dL (ref >39)
LDL Chol Calc (NIH): 153 mg/dL — ABNORMAL HIGH (ref 0–99)
Triglycerides: 73 mg/dL (ref 0–149)
VLDL Cholesterol Cal: 12 mg/dL (ref 5–40)

## 2022-09-11 LAB — COMPREHENSIVE METABOLIC PANEL
ALT: 20 IU/L (ref 0–32)
AST: 18 IU/L (ref 0–40)
Albumin/Globulin Ratio: 1.7
Albumin: 4.1 g/dL (ref 3.8–4.9)
Alkaline Phosphatase: 86 IU/L (ref 44–121)
BUN/Creatinine Ratio: 14 (ref 9–23)
BUN: 11 mg/dL (ref 6–24)
Bilirubin Total: 1.4 mg/dL — ABNORMAL HIGH (ref 0.0–1.2)
CO2: 26 mmol/L (ref 20–29)
Calcium: 9.6 mg/dL (ref 8.7–10.2)
Chloride: 96 mmol/L (ref 96–106)
Creatinine, Ser: 0.81 mg/dL (ref 0.57–1.00)
Globulin, Total: 2.4 g/dL (ref 1.5–4.5)
Glucose: 92 mg/dL (ref 70–99)
Potassium: 4.4 mmol/L (ref 3.5–5.2)
Sodium: 136 mmol/L (ref 134–144)
Total Protein: 6.5 g/dL (ref 6.0–8.5)
eGFR: 88 mL/min/{1.73_m2} (ref >59)

## 2022-09-11 LAB — LIPOPROTEIN A (LPA): Lipoprotein (a): 37.3 nmol/L (ref <75.0)

## 2022-09-17 ENCOUNTER — Encounter (HOSPITAL_BASED_OUTPATIENT_CLINIC_OR_DEPARTMENT_OTHER): Payer: Self-pay | Admitting: Cardiovascular Disease

## 2022-09-21 ENCOUNTER — Encounter (HOSPITAL_BASED_OUTPATIENT_CLINIC_OR_DEPARTMENT_OTHER): Payer: Self-pay | Admitting: Cardiovascular Disease

## 2022-09-21 DIAGNOSIS — Z5181 Encounter for therapeutic drug level monitoring: Secondary | ICD-10-CM

## 2022-09-21 DIAGNOSIS — E785 Hyperlipidemia, unspecified: Secondary | ICD-10-CM

## 2022-09-21 MED ORDER — ROSUVASTATIN CALCIUM 40 MG PO TABS: 40.0000 mg | ORAL_TABLET | Freq: Every day | ORAL | 3 refills | Status: DC

## 2022-09-24 NOTE — Telephone Encounter (Signed)
A requested

## 2022-09-26 ENCOUNTER — Other Ambulatory Visit: Payer: Self-pay | Admitting: *Deleted

## 2022-09-26 MED ORDER — VALSARTAN 80 MG PO TABS: 80.0000 mg | ORAL_TABLET | Freq: Every day | ORAL | 3 refills | Status: DC

## 2022-09-26 MED ORDER — ROSUVASTATIN CALCIUM 40 MG PO TABS: 40.0000 mg | ORAL_TABLET | Freq: Every day | ORAL | 3 refills | Status: DC

## 2022-09-27 MED ORDER — ASPIRIN 81 MG PO TBEC: 81.0000 mg | DELAYED_RELEASE_TABLET | Freq: Every day | ORAL | 3 refills | Status: AC

## 2022-09-27 NOTE — Telephone Encounter (Signed)
Spoke with patient 6/26 regarding allergy to Ibuprofen  Per patient she took for years prior to son being born but afterward Ibuprofen and Excedrin both she had issues with feeling like "throat closing up" She had started ASA and was not having any issues  Discussed with Dr Duke Salvia and she would like patient to continue with ASA if is tolerating ok  Mychart message sent to patient

## 2022-09-28 ENCOUNTER — Encounter (HOSPITAL_BASED_OUTPATIENT_CLINIC_OR_DEPARTMENT_OTHER): Payer: Self-pay | Admitting: Pharmacist Clinician (PhC)/ Clinical Pharmacy Specialist

## 2022-09-28 ENCOUNTER — Ambulatory Visit (INDEPENDENT_AMBULATORY_CARE_PROVIDER_SITE_OTHER): Payer: BC Managed Care – PPO | Admitting: Pharmacist Clinician (PhC)/ Clinical Pharmacy Specialist

## 2022-09-28 VITALS — BP 112/77 | HR 82 | Ht 67.0 in | Wt 217.4 lb

## 2022-09-28 DIAGNOSIS — I10 Essential (primary) hypertension: Secondary | ICD-10-CM | POA: Diagnosis not present

## 2022-09-28 NOTE — Assessment & Plan Note (Signed)
Assessment: BP is controlled in office BP 112/77 mmHg;   Tolerates current medications well without any side effects Denies SOB, palpitation, chest pain, headaches,or swelling Reiterated the importance of regular exercise and low salt diet   Plan:  Cut spironolactone to 12.5 mg once daily Continue taking all other medications Patient to keep record of BP readings with heart rate and report to [place] at the next visit Patient to follow up with PharmD in 1 month  Labs ordered today:  none

## 2022-09-28 NOTE — Progress Notes (Unsigned)
Office Visit    Patient Name: [name] Date of Encounter: 10/02/2022  Primary Care Provider:  Georganna Skeans, MD Primary Cardiologist:  None  Chief Complaint    Hypertension - Advanced hypertension clinic  Past Medical History   OSA   GERD On pantoprazloe  obesity Using compounded semaglutide - through [facility]    Allergies  Allergen Reactions   Ibuprofen Anaphylaxis   Tramadol Nausea And Vomiting and Swelling    History of Present Illness    [name] is a 52 y.o. female patient who was referred to the Advanced Hypertension Clinic by Dr [name].  Patient reports developing hypertension in pregnancy in 2017, complicated by preeclampsia.  Since then she has struggled to control her BP.  She has been maintained on hctz and spironolactone, but notes home readings are as high as 180/102, with diastolic averaging around 85.  Over the last 3.5 years she has lost a total of 85 pounds using semaglutide from [facility] - weekly injections  Today she returns for follow up.  Has been doing well with the addition of valsartan, but does note that she occasionally gets dizzy if standing to fast or bending over.  Home readings show 12/53 reading are < 100 systolic.   Blood Pressure Goal:  130/80  Current Medications: hctz 25 mg every day (am), spironolactone 25 mg every day (am), valsartan 80 mg every day (pm)   Adherence Assessment  Do you ever forget to take your medication? [] Yes [x] No  Do you ever skip doses due to side effects? [] Yes [x] No  Do you have trouble affording your medicines? [] Yes [x] No  Are you ever unable to pick up your medication due to transportation difficulties? [] Yes [x] No   Adherence strategy: 7 day minder  Previously tried:     Family Hx: both parents with hypertension, mother died from MI; brother and sister both with hypertenison, brother deceased - non coronary issues; son 61    Social Hx:      Tobacco: no  Alcohol: no  Caffeine:  occasional tea, some coffee in winter months  Diet:  cooks during the week, out on weekends; variety of meats, has cut back on red meats, more fish and chicken, not much pork; vegetables mostly frozen, does eat salads; not snacking much; smaller portions 2/2 semaglutide,  Exercise: walk from her house to the top of the road and back, maybe 3 days a week - limited 2/2 upcoming TKA in August  Home BP readings: has wrist monitor, read within 5 points of office cuff AM 26 readings average 109/73  HR 82  (range 94-123/60-85) PM 27 readings average 112/72  HR 91  (range 97-159/61-96)     Accessory Clinical Findings    Lab Results  Component Value Date   CREATININE 0.81 09/10/2022   BUN 11 09/10/2022   NA 136 09/10/2022   K 4.4 09/10/2022   CL 96 09/10/2022   CO2 26 09/10/2022   Lab Results  Component Value Date   ALT 20 09/10/2022   AST 18 09/10/2022   ALKPHOS 86 09/10/2022   BILITOT 1.4 (H) 09/10/2022   No results found for: "HGBA1C"  Screening for Secondary Hypertension:      08/30/2022   10:27 AM 10/02/2022    7:04 AM  Causes  Drugs/Herbals Screened      - Comments limits salt.  no caffeine.  no EtOH.  No NSAIDS.   Renovascular HTN Screened      - Comments check  renal Dopplers   Sleep Apnea Screened      - Comments snoring, daytime somnolence   Thyroid Disease Screened Screened  Hyperaldosteronism N/A   Pheochromocytoma N/A   Cushing's Syndrome N/A   Hyperparathyroidism N/A   Coarctation of the Aorta Screened      - Comments BP symmetric   Compliance Screened     Relevant Labs/Studies:    Latest Ref Rng & Units 09/10/2022    9:57 AM 07/18/2022    9:11 AM 07/27/2021    4:26 PM  Basic Labs  Sodium 134 - 144 mmol/L 136  146  140   Potassium 3.5 - 5.2 mmol/L 4.4  4.6  3.1   Creatinine 0.57 - 1.00 mg/dL 4.09  8.11  9.14                  Home Medications    Current Outpatient Medications  Medication Sig Dispense Refill   aspirin EC 81 MG tablet Take 1 tablet  (81 mg total) by mouth daily. Swallow whole. 90 tablet 3   cetirizine (ZYRTEC) 10 MG tablet Take 10 mg by mouth daily as needed for allergies (alternating with Allegra).     fexofenadine (ALLEGRA) 60 MG tablet Take 60 mg by mouth daily as needed for allergies or rhinitis (alternating with Zyrtec).     hydrochlorothiazide (HYDRODIURIL) 25 MG tablet TAKE 1 TABLET BY MOUTH DAILY 90 tablet 3   liothyronine (CYTOMEL) 5 MCG tablet Take 5 mcg by mouth daily.     meloxicam (MOBIC) 15 MG tablet Take 1 tablet (15 mg total) by mouth daily. 90 tablet 1   pantoprazole (PROTONIX) 40 MG tablet TAKE 1 TABLET BY MOUTH DAILY 90 tablet 3   rosuvastatin (CRESTOR) 40 MG tablet Take 1 tablet (40 mg total) by mouth daily. 90 tablet 3   SEMAGLUTIDE, 2 MG/DOSE, [place]      spironolactone (ALDACTONE) 25 MG tablet TAKE 1 TABLET BY MOUTH DAILY 90 tablet 3   valsartan (DIOVAN) 80 MG tablet Take 1 tablet (80 mg total) by mouth daily. 90 tablet 3   linaclotide (LINZESS) 290 MCG CAPS capsule Take 290 mcg by mouth daily before breakfast.     No current facility-administered medications for this visit.     Assessment & Plan    Essential hypertension Assessment: BP is controlled in office BP 112/77 mmHg;   Tolerates current medications well without any side effects Denies SOB, palpitation, chest pain, headaches,or swelling Reiterated the importance of regular exercise and low salt diet   Plan:  Cut spironolactone to 12.5 mg once daily Continue taking all other medications Patient to keep record of BP readings with heart rate and report to [place] at the next visit Patient to follow up with PharmD in 1 month  Labs ordered today:  none   Phillips Hay PharmD CPP [facility]  [address] [place], [place] [address] [phone]

## 2022-09-28 NOTE — Patient Instructions (Signed)
Follow up appointment: August 2 at 2 pm at the [facility] office (above [employer])  Take your BP meds as follows: Cut spironolactone tablets in half and take 12.5 mg once daily  Check your blood pressure at home daily (if able) and keep record of the readings.  Hypertension "High blood pressure"  Hypertension is often called "The Silent Killer." It rarely causes symptoms until it is extremely  high or has done damage to other organs in the body. For this reason, you should have your  blood pressure checked regularly by your physician. We will check your blood pressure  every time you see a provider at one of our offices.   Your blood pressure reading consists of two numbers. Ideally, blood pressure should be  below 120/80. The first ("top") number is called the systolic pressure. It measures the  pressure in your arteries as your heart beats. The second ("bottom") number is called the diastolic pressure. It measures the pressure in your arteries as the heart relaxes between beats.  The benefits of getting your blood pressure under control are enormous. A 10-point  reduction in systolic blood pressure can reduce your risk of stroke by 27% and heart failure by 28%  Your blood pressure goal is < 130/80  To check your pressure at home you will need to:  1. Sit up in a chair, with feet flat on the floor and back supported. Do not cross your ankles or legs. 2. Rest your left arm so that the cuff is about heart level. If the cuff goes on your upper arm,  then just relax the arm on the table, arm of the chair or your lap. If you have a wrist cuff, we  suggest relaxing your wrist against your chest (think of it as Pledging the Flag with the  wrong arm).  3. Place the cuff snugly around your arm, about 1 inch above the crook of your elbow. The  cords should be inside the groove of your elbow.  4. Sit quietly, with the cuff in place, for about 5 minutes. After that 5 minutes press the power  button  to start a reading. 5. Do not talk or move while the reading is taking place.  6. Record your readings on a sheet of paper. Although most cuffs have a memory, it is often  easier to see a pattern developing when the numbers are all in front of you.  7. You can repeat the reading after 1-3 minutes if it is recommended  Make sure your bladder is empty and you have not had caffeine or tobacco within the last 30 min  Always bring your blood pressure log with you to your appointments. If you have not brought your monitor in to be double checked for accuracy, please bring it to your next appointment.  You can find a list of quality blood pressure cuffs at validatebp.org

## 2022-10-01 ENCOUNTER — Telehealth: Payer: Self-pay

## 2022-10-01 DIAGNOSIS — R400 Somnolence: Secondary | ICD-10-CM

## 2022-10-01 DIAGNOSIS — R0683 Snoring: Secondary | ICD-10-CM

## 2022-10-01 NOTE — Telephone Encounter (Signed)
**Note De-Identified Jolayne Branson Obfuscation** Per Alvino Chapel with [facility], the pts plan does not cover a Itamar-HST as it is considered investigational. The pts plan does cover a Split night, Plain NPSG, or a HST type 3. Reference #: [id number]  Forwarding this note to Dr Duke Salvia and her nurse for advisement to the pt.

## 2022-10-03 NOTE — Telephone Encounter (Signed)
Per Dr. Duke Salvia okay to cancel itamar and order split night sleep study.

## 2022-10-11 NOTE — Telephone Encounter (Signed)
[name] returned by patient! Unregistered in [place].

## 2022-10-12 ENCOUNTER — Encounter: Payer: Self-pay | Admitting: Family Medicine

## 2022-10-15 ENCOUNTER — Other Ambulatory Visit: Payer: Self-pay | Admitting: Family Medicine

## 2022-10-15 MED ORDER — MELOXICAM 15 MG PO TABS: 15.0000 mg | ORAL_TABLET | Freq: Every day | ORAL | 1 refills | Status: DC

## 2022-10-18 ENCOUNTER — Other Ambulatory Visit: Payer: BC Managed Care – PPO

## 2022-10-18 DIAGNOSIS — M1711 Unilateral primary osteoarthritis, right knee: Secondary | ICD-10-CM

## 2022-10-19 LAB — CBC WITH DIFFERENTIAL/PLATELET
Basophils Absolute: 0 10*3/uL (ref 0.0–0.2)
Basos: 1 %
EOS (ABSOLUTE): 0.1 10*3/uL (ref 0.0–0.4)
Eos: 1 %
Hematocrit: 41.3 % (ref 34.0–46.6)
Hemoglobin: 13.7 g/dL (ref 11.1–15.9)
Immature Grans (Abs): 0 10*3/uL (ref 0.0–0.1)
Immature Granulocytes: 1 %
Lymphocytes Absolute: 2.3 10*3/uL (ref 0.7–3.1)
Lymphs: 27 %
MCH: 30.8 pg (ref 26.6–33.0)
MCHC: 33.2 g/dL (ref 31.5–35.7)
MCV: 93 fL (ref 79–97)
Monocytes Absolute: 0.5 10*3/uL (ref 0.1–0.9)
Monocytes: 6 %
Neutrophils Absolute: 5.5 10*3/uL (ref 1.4–7.0)
Neutrophils: 64 %
Platelets: 249 10*3/uL (ref 150–450)
RBC: 4.45 x10E6/uL (ref 3.77–5.28)
RDW: 12.4 % (ref 11.7–15.4)
WBC: 8.4 10*3/uL (ref 3.4–10.8)

## 2022-10-19 LAB — CMP14+EGFR
ALT: 22 IU/L (ref 0–32)
AST: 21 IU/L (ref 0–40)
Albumin: 4 g/dL (ref 3.8–4.9)
Alkaline Phosphatase: 85 IU/L (ref 44–121)
BUN/Creatinine Ratio: 15 (ref 9–23)
BUN: 14 mg/dL (ref 6–24)
Bilirubin Total: 0.9 mg/dL (ref 0.0–1.2)
CO2: 22 mmol/L (ref 20–29)
Calcium: 9.6 mg/dL (ref 8.7–10.2)
Chloride: 99 mmol/L (ref 96–106)
Creatinine, Ser: 0.94 mg/dL (ref 0.57–1.00)
Globulin, Total: 2.6 g/dL (ref 1.5–4.5)
Glucose: 86 mg/dL (ref 70–99)
Potassium: 3.8 mmol/L (ref 3.5–5.2)
Sodium: 142 mmol/L (ref 134–144)
Total Protein: 6.6 g/dL (ref 6.0–8.5)
eGFR: 73 mL/min/{1.73_m2} (ref >59)

## 2022-11-02 ENCOUNTER — Ambulatory Visit
Payer: BC Managed Care – PPO | Attending: Cardiovascular Disease | Admitting: Pharmacist Clinician (PhC)/ Clinical Pharmacy Specialist

## 2022-11-02 ENCOUNTER — Encounter: Payer: Self-pay | Admitting: Pharmacist Clinician (PhC)/ Clinical Pharmacy Specialist

## 2022-11-02 VITALS — BP 101/69 | HR 72

## 2022-11-02 DIAGNOSIS — I10 Essential (primary) hypertension: Secondary | ICD-10-CM | POA: Diagnosis not present

## 2022-11-02 NOTE — Progress Notes (Unsigned)
Office Visit    Patient Name: [name] Date of Encounter: 11/02/2022  Primary Care Provider:  Georganna Skeans, MD Primary Cardiologist:  None  Chief Complaint    Hypertension - Advanced hypertension clinic  Past Medical History   OSA   GERD On pantoprazloe  obesity Using compounded semaglutide - through [facility]    Allergies  Allergen Reactions   Ibuprofen Anaphylaxis   Tramadol Nausea And Vomiting and Swelling    History of Present Illness    [name] is a 52 y.o. female patient who was referred to the [facility] by Dr Georganna Skeans.  Patient reports developing hypertension in pregnancy in 2017, complicated by preeclampsia.  Since then she has struggled to control her BP.  She has been maintained on hctz and spironolactone, but notes home readings are as high as 180/102, with diastolic averaging around 85.  Over the last 3.5 years she has lost a total of 85 pounds using semaglutide from [facility] - weekly injections.  When I saw her last month, her pressure was much improved at 112/77.  She reported occasional dizziness, especially with positional changes, so we decreased the spironolactone to 12.5 mg daily  Today she returns for follow up.   Some home readings systolic < 100, down to 94 lowest.  Stopped semaglutide in July before colonoscopy and will continue to hold, as she has upcoming knee surgery in August.   Her plan is to resume towards the end of August.  no increase in appetite; lost 80 lbs , may resume after TKA in August.  She tells me today that she has had several BP readings at home that were < 100 systolic, with the lowest being 94.    Blood Pressure Goal:  130/80  Current Medications: hctz 25 mg every day (am), spironolactone 12.5 mg every day (am), valsartan 80 mg every day (pm)   Adherence Assessment  Do you ever forget to take your medication? [] Yes [x] No  Do you ever skip doses due to side effects? [] Yes [x] No  Do you have trouble  affording your medicines? [] Yes [x] No  Are you ever unable to pick up your medication due to transportation difficulties? [] Yes [x] No   Adherence strategy: 7 day minder  Previously tried:     Family Hx: both parents with hypertension, mother died from MI; brother and sister both with hypertenison, brother deceased - non coronary issues; son 38    Social Hx:      Tobacco: no  Alcohol: no  Caffeine: occasional tea, some coffee in winter months  Diet:  cooks during the week, out on weekends; variety of meats, has cut back on red meats, more fish and chicken, not much pork; vegetables mostly frozen, does eat salads; not snacking much; smaller portions 2/2 semaglutide,  Exercise: walk from her house to the top of the road and back, maybe 3 days a week - limited 2/2 upcoming TKA in August  Home BP readings: has wrist monitor, read within 5 points of office cuff AM 26 readings average 109/73  HR 82  (range 94-123/60-85) PM 27 readings average 112/72  HR 91  (range 97-159/61-96)     Accessory Clinical Findings    Lab Results  Component Value Date   CREATININE 0.94 10/18/2022   BUN 14 10/18/2022   NA 142 10/18/2022   K 3.8 10/18/2022   CL 99 10/18/2022   CO2 22 10/18/2022   Lab Results  Component Value Date   ALT 22  10/18/2022   AST 21 10/18/2022   ALKPHOS 85 10/18/2022   BILITOT 0.9 10/18/2022   No results found for: "HGBA1C"  Screening for Secondary Hypertension:      08/30/2022   10:27 AM 10/02/2022    7:04 AM  Causes  Drugs/Herbals Screened      - Comments limits salt.  no caffeine.  no EtOH.  No NSAIDS.   Renovascular HTN Screened      - Comments check renal Dopplers   Sleep Apnea Screened      - Comments snoring, daytime somnolence   Thyroid Disease Screened Screened  Hyperaldosteronism N/A   Pheochromocytoma N/A   Cushing's Syndrome N/A   Hyperparathyroidism N/A   Coarctation of the Aorta Screened      - Comments BP symmetric   Compliance Screened      Relevant Labs/Studies:    Latest Ref Rng & Units 10/18/2022    9:09 AM 09/10/2022    9:57 AM 07/18/2022    9:11 AM  Basic Labs  Sodium 134 - 144 mmol/L 142  136  146   Potassium 3.5 - 5.2 mmol/L 3.8  4.4  4.6   Creatinine 0.57 - 1.00 mg/dL 7.82  9.56  2.13                  Home Medications    Current Outpatient Medications  Medication Sig Dispense Refill   aspirin EC 81 MG tablet Take 1 tablet (81 mg total) by mouth daily. Swallow whole. 90 tablet 3   cetirizine (ZYRTEC) 10 MG tablet Take 10 mg by mouth daily as needed for allergies (alternating with Allegra).     fexofenadine (ALLEGRA) 60 MG tablet Take 60 mg by mouth daily as needed for allergies or rhinitis (alternating with Zyrtec).     hydrochlorothiazide (HYDRODIURIL) 25 MG tablet TAKE 1 TABLET BY MOUTH DAILY 90 tablet 3   linaclotide (LINZESS) 290 MCG CAPS capsule Take 290 mcg by mouth daily before breakfast.     liothyronine (CYTOMEL) 5 MCG tablet Take 5 mcg by mouth daily.     meloxicam (MOBIC) 15 MG tablet Take 1 tablet (15 mg total) by mouth daily. 90 tablet 1   pantoprazole (PROTONIX) 40 MG tablet TAKE 1 TABLET BY MOUTH DAILY 90 tablet 3   rosuvastatin (CRESTOR) 40 MG tablet Take 1 tablet (40 mg total) by mouth daily. 90 tablet 3   SEMAGLUTIDE, 2 MG/DOSE, [place]      spironolactone (ALDACTONE) 25 MG tablet TAKE 1 TABLET BY MOUTH DAILY 90 tablet 3   valsartan (DIOVAN) 80 MG tablet Take 1 tablet (80 mg total) by mouth daily. 90 tablet 3   No current facility-administered medications for this visit.     Assessment & Plan    Essential hypertension Assessment: BP is controlled in office BP 101/69 mmHg;   Having few hypotensive episodes with dizziness; no syncopal events.  Some of those episodes were after being outside in the hot/humid weather.  Tolerates current medications well without any side effects Denies SOB, palpitation, chest pain, headaches,or swelling Reiterated the importance of regular exercise and low salt  diet   Plan:  Stop taking spironolactone Continue taking hydrochlorothiazide and valsartan  Patient to keep record of BP readings with heart rate and report to [place] at the next visit Patient to follow up with Dr. Duke Salvia in 6 months  Labs ordered today:  none   Phillips Hay PharmD CPP [facility]  [address] [place], [place]  [address] [phone] 

## 2022-11-02 NOTE — Patient Instructions (Signed)
Follow up appointment: 6 months with [facility] (we will send you a message to schedule)  Go to the lab in mid-September to check cholesterol labs  Take your BP meds as follows: STOP spironolactone.  Continue with other medications  Check your blood pressure at home daily (if able) and keep record of the readings.  Hypertension "High blood pressure"  Hypertension is often called "The Silent Killer." It rarely causes symptoms until it is extremely  high or has done damage to other organs in the body. For this reason, you should have your  blood pressure checked regularly by your physician. We will check your blood pressure  every time you see a provider at one of our offices.   Your blood pressure reading consists of two numbers. Ideally, blood pressure should be  below 120/80. The first ("top") number is called the systolic pressure. It measures the  pressure in your arteries as your heart beats. The second ("bottom") number is called the diastolic pressure. It measures the pressure in your arteries as the heart relaxes between beats.  The benefits of getting your blood pressure under control are enormous. A 10-point  reduction in systolic blood pressure can reduce your risk of stroke by 27% and heart failure by 28%  Your blood pressure goal is < 130/80  To check your pressure at home you will need to:  1. Sit up in a chair, with feet flat on the floor and back supported. Do not cross your ankles or legs. 2. Rest your left arm so that the cuff is about heart level. If the cuff goes on your upper arm,  then just relax the arm on the table, arm of the chair or your lap. If you have a wrist cuff, we  suggest relaxing your wrist against your chest (think of it as Pledging the Flag with the  wrong arm).  3. Place the cuff snugly around your arm, about 1 inch above the crook of your elbow. The  cords should be inside the groove of your elbow.  4. Sit quietly, with the  cuff in place, for about 5 minutes. After that 5 minutes press the power  button to start a reading. 5. Do not talk or move while the reading is taking place.  6. Record your readings on a sheet of paper. Although most cuffs have a memory, it is often  easier to see a pattern developing when the numbers are all in front of you.  7. You can repeat the reading after 1-3 minutes if it is recommended  Make sure your bladder is empty and you have not had caffeine or tobacco within the last 30 min  Always bring your blood pressure log with you to your appointments. If you have not brought your monitor in to be double checked for accuracy, please bring it to your next appointment.  You can find a list of quality blood pressure cuffs at validatebp.org

## 2022-11-02 NOTE — Assessment & Plan Note (Signed)
Assessment: BP is controlled in office BP 101/69 mmHg;   Having few hypotensive episodes with dizziness; no syncopal events.  Some of those episodes were after being outside in the hot/humid weather.  Tolerates current medications well without any side effects Denies SOB, palpitation, chest pain, headaches,or swelling Reiterated the importance of regular exercise and low salt diet   Plan:  Stop taking spironolactone Continue taking hydrochlorothiazide and valsartan  Patient to keep record of BP readings with heart rate and report to [place] at the next visit Patient to follow up with Dr. Duke Salvia in 6 months  Labs ordered today:  none

## 2022-11-07 ENCOUNTER — Encounter (HOSPITAL_BASED_OUTPATIENT_CLINIC_OR_DEPARTMENT_OTHER): Payer: BC Managed Care – PPO | Admitting: Cardiology

## 2022-11-09 ENCOUNTER — Encounter (HOSPITAL_BASED_OUTPATIENT_CLINIC_OR_DEPARTMENT_OTHER): Payer: BC Managed Care – PPO | Admitting: Cardiology

## 2023-01-08 ENCOUNTER — Ambulatory Visit (INDEPENDENT_AMBULATORY_CARE_PROVIDER_SITE_OTHER): Payer: BC Managed Care – PPO | Admitting: Family Medicine

## 2023-01-08 ENCOUNTER — Encounter: Payer: Self-pay | Admitting: Family Medicine

## 2023-01-08 VITALS — BP 115/81 | HR 88 | Temp 98.3°F | Resp 18 | Ht 67.0 in | Wt 249.0 lb

## 2023-01-08 DIAGNOSIS — E66812 Obesity, class 2: Secondary | ICD-10-CM | POA: Diagnosis not present

## 2023-01-08 DIAGNOSIS — I10 Essential (primary) hypertension: Secondary | ICD-10-CM | POA: Diagnosis not present

## 2023-01-08 DIAGNOSIS — E785 Hyperlipidemia, unspecified: Secondary | ICD-10-CM

## 2023-01-08 DIAGNOSIS — K219 Gastro-esophageal reflux disease without esophagitis: Secondary | ICD-10-CM | POA: Diagnosis not present

## 2023-01-08 DIAGNOSIS — G8929 Other chronic pain: Secondary | ICD-10-CM

## 2023-01-08 DIAGNOSIS — Z6839 Body mass index (BMI) 39.0-39.9, adult: Secondary | ICD-10-CM

## 2023-01-08 DIAGNOSIS — M25562 Pain in left knee: Secondary | ICD-10-CM

## 2023-01-08 DIAGNOSIS — B3731 Acute candidiasis of vulva and vagina: Secondary | ICD-10-CM

## 2023-01-08 MED ORDER — HYDROCODONE-ACETAMINOPHEN 5-325 MG PO TABS: 1.0000 | ORAL_TABLET | Freq: Four times a day (QID) | ORAL | 0 refills | Status: DC | PRN

## 2023-01-08 MED ORDER — MELOXICAM 15 MG PO TABS: 15.0000 mg | ORAL_TABLET | Freq: Every day | ORAL | 1 refills | Status: DC

## 2023-01-08 MED ORDER — PHENTERMINE HCL 37.5 MG PO TABS: 37.5000 mg | ORAL_TABLET | Freq: Every day | ORAL | 1 refills | Status: DC

## 2023-01-08 MED ORDER — PANTOPRAZOLE SODIUM 40 MG PO TBEC
40.0000 mg | DELAYED_RELEASE_TABLET | Freq: Every day | ORAL | 3 refills | Status: DC
Start: 2023-01-08 — End: 2023-06-25

## 2023-01-08 MED ORDER — HYDROCHLOROTHIAZIDE 25 MG PO TABS: 25.0000 mg | ORAL_TABLET | Freq: Every day | ORAL | 3 refills | Status: DC

## 2023-01-08 MED ORDER — FLUCONAZOLE 150 MG PO TABS: 150.0000 mg | ORAL_TABLET | Freq: Once | ORAL | 0 refills | Status: AC

## 2023-01-08 NOTE — Progress Notes (Unsigned)
Established Patient Office Visit  Subjective    Patient ID: [name], female    DOB: 1971-02-09  Age: 52 y.o. MRN: [id number]  CC:  Chief Complaint  Patient presents with   Follow-up    Left knee pain, medication refill    HPI [name] presents for follow up of chronic med issues. Patient also reports a vaginal yeast infection from 2 recent courses of antibiotics.   Outpatient Encounter Medications as of 01/08/2023  Medication Sig   aspirin EC 81 MG tablet Take 1 tablet (81 mg total) by mouth daily. Swallow whole.   cetirizine (ZYRTEC) 10 MG tablet Take 10 mg by mouth daily as needed for allergies (alternating with Allegra).   fexofenadine (ALLEGRA) 60 MG tablet Take 60 mg by mouth daily as needed for allergies or rhinitis (alternating with Zyrtec).   hydrochlorothiazide (HYDRODIURIL) 25 MG tablet TAKE 1 TABLET BY MOUTH DAILY   meloxicam (MOBIC) 15 MG tablet Take 1 tablet (15 mg total) by mouth daily.   pantoprazole (PROTONIX) 40 MG tablet TAKE 1 TABLET BY MOUTH DAILY   valsartan (DIOVAN) 80 MG tablet Take 1 tablet (80 mg total) by mouth daily.   liothyronine (CYTOMEL) 5 MCG tablet Take 5 mcg by mouth daily. (Patient not taking: Reported on 01/08/2023)   rosuvastatin (CRESTOR) 40 MG tablet Take 1 tablet (40 mg total) by mouth daily.   [DISCONTINUED] linaclotide (LINZESS) 290 MCG CAPS capsule Take 290 mcg by mouth daily before breakfast.   [DISCONTINUED] SEMAGLUTIDE, 2 MG/DOSE, [place]    No facility-administered encounter medications on file as of 01/08/2023.    Past Medical History:  Diagnosis Date   Anemia    Arthritis    Depression    due to perimenopausal phase per pt.   Essential hypertension 08/30/2022   GERD (gastroesophageal reflux disease)    History of hiatal hernia    Postpartum care following cesarean delivery (4/27) 07/28/2015   Pregnancy induced hypertension    during pregnancy   Sleep apnea    no machine   Varicose veins    Vitamin D deficiency      Past Surgical History:  Procedure Laterality Date   ABDOMINAL EXPOSURE N/A 04/27/2019   Procedure: ABDOMINAL EXPOSURE;  Surgeon: Chuck Hint, MD;  Location: [facility] OR;  Service: Vascular;  Laterality: N/A;   ANTERIOR LUMBAR FUSION N/A 04/27/2019   Procedure: ANTERIOR LUMBAR INTERBODY FUSION LUMBAR FOUR-FIVE WITH INSTRUMENTATION AND ALLOGRAFT;  Surgeon: Estill Bamberg, MD;  Location: [facility] OR;  Service: Orthopedics;  Laterality: N/A;   CESAREAN SECTION N/A 07/28/2015   Procedure: CESAREAN SECTION;  Surgeon: Olivia Mackie, MD;  Location: [facility] ORS;  Service: Obstetrics;  Laterality: N/A;   ESOPHAGEAL MANOMETRY N/A 01/02/2017   Procedure: ESOPHAGEAL MANOMETRY (EM);  Surgeon: Kerin Salen, MD;  Location: [facility] ENDOSCOPY;  Service: Gastroenterology;  Laterality: N/A;   HERNIA REPAIR     hiatal hernia repair 03-06-17   INSERTION OF MESH N/A 03/06/2017   Procedure: INSERTION OF MESH;  Surgeon: Axel Filler, MD;  Location: [facility] ORS;  Service: General;  Laterality: N/A;   TOE SURGERY Bilateral     Family History  Problem Relation Age of Onset   Heart attack Mother 27   Heart disease Mother    Diabetes Mother    Hypertension Mother    Cancer Father    Hypertension Father    Hypertension Sister    Asthma Sister    Seizures Sister    Bipolar disorder Sister    Hypertension Brother  Social History   Socioeconomic History   Marital status: Married    Spouse name: Not on file   Number of children: Not on file   Years of education: Not on file   Highest education level: 12th grade  Occupational History   Not on file  Tobacco Use   Smoking status: Former    Current packs/day: 0.00    Types: Cigarettes    Quit date: 03/06/2011    Years since quitting: 11.8   Smokeless tobacco: Never  Vaping Use   Vaping status: Never Used  Substance and Sexual Activity   Alcohol use: No    Alcohol/week: 0.0 standard drinks of alcohol   Drug use: No   Sexual activity: Yes  Other Topics Concern    Not on file  Social History Narrative   ** Merged History Encounter **       Social Determinants of Health   Financial Resource Strain: Low Risk  (01/06/2023)   Overall Financial Resource Strain (CARDIA)    Difficulty of Paying Living Expenses: Not very hard  Food Insecurity: No Food Insecurity (01/06/2023)   Hunger Vital Sign    Worried About Running Out of Food in the Last Year: Never true    Ran Out of Food in the Last Year: Never true  Transportation Needs: No Transportation Needs (01/06/2023)   PRAPARE - Administrator, Civil Service (Medical): No    Lack of Transportation (Non-Medical): No  Physical Activity: Insufficiently Active (01/06/2023)   Exercise Vital Sign    Days of Exercise per Week: 2 days    Minutes of Exercise per Session: 40 min  Stress: No Stress Concern Present (01/08/2023)   [employer] of Occupational Health - Occupational Stress Questionnaire    Feeling of Stress : Only a little  Social Connections: Socially Integrated (01/06/2023)   Social Connection and Isolation Panel [NHANES]    Frequency of Communication with Friends and Family: Three times a week    Frequency of Social Gatherings with Friends and Family: Once a week    Attends Religious Services: More than 4 times per year    Active Member of [employer] or Organizations: Yes    Attends Engineer, structural: More than 4 times per year    Marital Status: Married  Catering manager Violence: Not At Risk (01/08/2023)   Humiliation, Afraid, Rape, and Kick questionnaire    Fear of Current or Ex-Partner: No    Emotionally Abused: No    Physically Abused: No    Sexually Abused: No    Review of Systems  All other systems reviewed and are negative.       Objective    BP 115/81 (BP Location: Right Arm, Patient Position: Sitting, Cuff Size: Large)   Pulse 88   Temp 98.3 F (36.8 C) (Oral)   Resp 18   Ht 5\' 7"  (1.702 m)   Wt 249 lb (112.9 kg)   SpO2 95%   BMI 39.00 kg/m    Physical Exam Vitals and nursing note reviewed.  Constitutional:      General: She is not in acute distress.    Appearance: She is obese.  Cardiovascular:     Rate and Rhythm: Normal rate and regular rhythm.  Pulmonary:     Effort: Pulmonary effort is normal.     Breath sounds: Normal breath sounds.  Abdominal:     Palpations: Abdomen is soft.     Tenderness: There is no abdominal tenderness.  Musculoskeletal:  Left knee: No swelling. Decreased range of motion. Tenderness present.  Neurological:     General: No focal deficit present.     Mental Status: She is alert and oriented to person, place, and time.         Assessment & Plan:   1. Essential hypertension Appears stable. Continue   2. Hyperlipidemia, unspecified hyperlipidemia type Continue   3. Gastroesophageal reflux disease without esophagitis Continue   4. Class 2 severe obesity due to excess calories with serious comorbidity and body mass index (BMI) of 39.0 to 39.9 in adult (HCC)   5. Chronic pain of left knee Patient has replacement surgery scheduled for  11/18.   6. Yeast vaginitis Patient had 2 courses of antibiotics recently. Diflucan prescribed  No follow-ups on file.   Tommie Raymond, MD

## 2023-01-10 ENCOUNTER — Encounter: Payer: Self-pay | Admitting: Family Medicine

## 2023-01-17 ENCOUNTER — Ambulatory Visit: Payer: BC Managed Care – PPO | Admitting: Family Medicine

## 2023-02-01 ENCOUNTER — Other Ambulatory Visit: Payer: BC Managed Care – PPO

## 2023-02-01 DIAGNOSIS — M1712 Unilateral primary osteoarthritis, left knee: Secondary | ICD-10-CM

## 2023-02-01 NOTE — Progress Notes (Signed)
Patient came in  for pre surgical labs for TKA

## 2023-02-02 LAB — CBC WITH DIFFERENTIAL/PLATELET
Basophils Absolute: 0 x10E3/uL (ref 0.0–0.2)
Basos: 1 %
EOS (ABSOLUTE): 0.2 x10E3/uL (ref 0.0–0.4)
Eos: 4 %
Hematocrit: 41.3 % (ref 34.0–46.6)
Hemoglobin: 13.3 g/dL (ref 11.1–15.9)
Immature Grans (Abs): 0 x10E3/uL (ref 0.0–0.1)
Immature Granulocytes: 1 %
Lymphocytes Absolute: 1.9 x10E3/uL (ref 0.7–3.1)
Lymphs: 31 %
MCH: 31.3 pg (ref 26.6–33.0)
MCHC: 32.2 g/dL (ref 31.5–35.7)
MCV: 97 fL (ref 79–97)
Monocytes Absolute: 0.3 x10E3/uL (ref 0.1–0.9)
Monocytes: 5 %
Neutrophils Absolute: 3.6 x10E3/uL (ref 1.4–7.0)
Neutrophils: 58 %
Platelets: 227 x10E3/uL (ref 150–450)
RBC: 4.25 x10E6/uL (ref 3.77–5.28)
RDW: 11.8 % (ref 11.7–15.4)
WBC: 6.1 x10E3/uL (ref 3.4–10.8)

## 2023-02-02 LAB — CMP14+EGFR
ALT: 77 IU/L — ABNORMAL HIGH (ref 0–32)
AST: 56 IU/L — ABNORMAL HIGH (ref 0–40)
Albumin: 4.2 g/dL (ref 3.8–4.9)
Alkaline Phosphatase: 104 IU/L (ref 44–121)
BUN/Creatinine Ratio: 19 (ref 9–23)
BUN: 13 mg/dL (ref 6–24)
Bilirubin Total: 0.6 mg/dL (ref 0.0–1.2)
CO2: 26 mmol/L (ref 20–29)
Calcium: 9.6 mg/dL (ref 8.7–10.2)
Chloride: 97 mmol/L (ref 96–106)
Creatinine, Ser: 0.69 mg/dL (ref 0.57–1.00)
Globulin, Total: 2.4 g/dL (ref 1.5–4.5)
Glucose: 106 mg/dL — ABNORMAL HIGH (ref 70–99)
Potassium: 4.3 mmol/L (ref 3.5–5.2)
Sodium: 140 mmol/L (ref 134–144)
Total Protein: 6.6 g/dL (ref 6.0–8.5)
eGFR: 105 mL/min/1.73

## 2023-02-05 NOTE — Progress Notes (Signed)
Patient view results via mychart.

## 2023-04-18 ENCOUNTER — Encounter (HOSPITAL_BASED_OUTPATIENT_CLINIC_OR_DEPARTMENT_OTHER): Payer: BC Managed Care – PPO | Admitting: Cardiovascular Disease

## 2023-05-22 ENCOUNTER — Telehealth: Payer: Self-pay | Admitting: Family Medicine

## 2023-05-22 NOTE — Telephone Encounter (Signed)
 Called patient to inform her that we are operating on a 2 hour delay on 05/23/2023 and her appointment will now be at 1pm if she needs to cancel or change it she can call the office

## 2023-05-23 ENCOUNTER — Ambulatory Visit: Payer: BC Managed Care – PPO | Admitting: Family Medicine

## 2023-05-27 ENCOUNTER — Encounter (HOSPITAL_BASED_OUTPATIENT_CLINIC_OR_DEPARTMENT_OTHER): Payer: Self-pay | Admitting: Cardiovascular Disease

## 2023-05-27 ENCOUNTER — Ambulatory Visit (HOSPITAL_BASED_OUTPATIENT_CLINIC_OR_DEPARTMENT_OTHER): Payer: BC Managed Care – PPO | Admitting: Cardiovascular Disease

## 2023-05-27 VITALS — BP 128/78 | HR 74 | Ht 67.0 in | Wt 259.8 lb

## 2023-05-27 DIAGNOSIS — F17200 Nicotine dependence, unspecified, uncomplicated: Secondary | ICD-10-CM

## 2023-05-27 DIAGNOSIS — I10 Essential (primary) hypertension: Secondary | ICD-10-CM

## 2023-05-27 DIAGNOSIS — Z8759 Personal history of other complications of pregnancy, childbirth and the puerperium: Secondary | ICD-10-CM

## 2023-05-27 DIAGNOSIS — O1490 Unspecified pre-eclampsia, unspecified trimester: Secondary | ICD-10-CM

## 2023-05-27 DIAGNOSIS — Z6841 Body Mass Index (BMI) 40.0 and over, adult: Secondary | ICD-10-CM

## 2023-05-27 DIAGNOSIS — I2510 Atherosclerotic heart disease of native coronary artery without angina pectoris: Secondary | ICD-10-CM | POA: Diagnosis not present

## 2023-05-27 DIAGNOSIS — Z131 Encounter for screening for diabetes mellitus: Secondary | ICD-10-CM

## 2023-05-27 HISTORY — DX: Atherosclerotic heart disease of native coronary artery without angina pectoris: I25.10

## 2023-05-27 NOTE — Progress Notes (Signed)
 Advanced Hypertension Clinic Follow Up:    Date:  05/27/2023   ID:  [name], DOB 06/28/1970, MRN [id number]  PCP:  Georganna Skeans, MD  Cardiologist:  None  Nephrologist:  Referring MD: Georganna Skeans, MD   CC: Hypertension  History of Present Illness:    [name] is a 53 y.o. female with a hx of non-obstructive CAD, hypertension, pregnancy-induced hypertension, OSA, and GERD here for follow-up.  She was first seen in advanced hypertension clinic 08/2022. She confirms having hypertension since her pregnancy in 2017, complicated by preeclampsia. Her now 46 year old son had been delivered 1 week early. Ever since then she has struggled with controlling her blood pressures. She was not hypertensive prior to her pregnancy. Initially she had started on spironolactone, and later added HCTZ which also helps her swelling tremendously. She complains of bilateral LE swelling that has been ongoing for most of her life.  At her initial visit valsartan was added to spironolactone and HCTZ.  We recommend increasing her exercise.  A home sleep study was ordered but not yet performed.  She was cleared for knee surgery.  LP(a) was within normal limits.  [name] has a history of knee surgeries complicated by suture allergies, leading to infections at the incision sites. She is currently undergoing physical therapy and experiences persistent numbness and swelling, particularly in the knees, with the right knee more affected than the left. The swelling is concentrated in the knee area, and compression garments worn post-surgery caused discomfort and bruising, especially at the back of the leg. Compression socks used at home are ineffective as they roll down and do not alleviate the swelling.  Her blood pressure readings at home have been good, with recent measurements around 104/71, although a recent check showed 132/77. She is currently on hydrochlorothiazide and valsartan for blood pressure management. No  issues with breathing or shortness of breath when lying down.  She notes weight gain since the surgeries and has a craving for sweets. She is concerned about her weight and has an upcoming appointment with her medical doctor to address weight management. She reports not consuming much fried food and usually bakes meat.  Her glucose level was noted to be 106, and she recalls a previous A1c test indicating prediabetes, though it has not been checked recently. She also reports a history of elevated liver enzymes, possibly related to Tylenol use.      Previous antihypertensives: N/a  Past Medical History:  Diagnosis Date   Anemia    Arthritis    CAD in native artery 05/27/2023   Depression    due to perimenopausal phase per pt.   Essential hypertension 08/30/2022   GERD (gastroesophageal reflux disease)    History of hiatal hernia    Postpartum care following cesarean delivery (4/27) 07/28/2015   Pregnancy induced hypertension    during pregnancy   Sleep apnea    no machine   Varicose veins    Vitamin D deficiency     Past Surgical History:  Procedure Laterality Date   ABDOMINAL EXPOSURE N/A 04/27/2019   Procedure: ABDOMINAL EXPOSURE;  Surgeon: Chuck Hint, MD;  Location: [facility] OR;  Service: Vascular;  Laterality: N/A;   ANTERIOR LUMBAR FUSION N/A 04/27/2019   Procedure: ANTERIOR LUMBAR INTERBODY FUSION LUMBAR FOUR-FIVE WITH INSTRUMENTATION AND ALLOGRAFT;  Surgeon: Estill Bamberg, MD;  Location: [facility] OR;  Service: Orthopedics;  Laterality: N/A;   CESAREAN SECTION N/A 07/28/2015   Procedure: CESAREAN SECTION;  Surgeon: Olivia Mackie,  MD;  Location: [facility] ORS;  Service: Obstetrics;  Laterality: N/A;   ESOPHAGEAL MANOMETRY N/A 01/02/2017   Procedure: ESOPHAGEAL MANOMETRY (EM);  Surgeon: Kerin Salen, MD;  Location: [facility] ENDOSCOPY;  Service: Gastroenterology;  Laterality: N/A;   HERNIA REPAIR     hiatal hernia repair 03-06-17   INSERTION OF MESH N/A 03/06/2017   Procedure: INSERTION OF  MESH;  Surgeon: Axel Filler, MD;  Location: [facility] ORS;  Service: General;  Laterality: N/A;   TOE SURGERY Bilateral     Current Medications: Current Meds  Medication Sig   aspirin EC 81 MG tablet Take 1 tablet (81 mg total) by mouth daily. Swallow whole.   cetirizine (ZYRTEC) 10 MG tablet Take 10 mg by mouth daily as needed for allergies (alternating with Allegra).   fexofenadine (ALLEGRA) 60 MG tablet Take 60 mg by mouth daily as needed for allergies or rhinitis (alternating with Zyrtec).   hydrochlorothiazide (HYDRODIURIL) 25 MG tablet Take 1 tablet (25 mg total) by mouth daily.   meloxicam (MOBIC) 15 MG tablet Take 1 tablet (15 mg total) by mouth daily.   pantoprazole (PROTONIX) 40 MG tablet Take 1 tablet (40 mg total) by mouth daily.   valsartan (DIOVAN) 80 MG tablet Take 1 tablet (80 mg total) by mouth daily.     Allergies:   Aleve [naproxen], Excedrin tension headache [acetaminophen-caffeine], Ibuprofen, and Tramadol   Social History   Socioeconomic History   Marital status: Married    Spouse name: Not on file   Number of children: Not on file   Years of education: Not on file   Highest education level: 12th grade  Occupational History   Not on file  Tobacco Use   Smoking status: Former    Current packs/day: 0.00    Types: Cigarettes    Quit date: 03/06/2011    Years since quitting: 12.2   Smokeless tobacco: Never  Vaping Use   Vaping status: Never Used  Substance and Sexual Activity   Alcohol use: No    Alcohol/week: 0.0 standard drinks of alcohol   Drug use: No   Sexual activity: Yes  Other Topics Concern   Not on file  Social History Narrative   ** Merged History Encounter **       Social Drivers of Health   Financial Resource Strain: Low Risk  (01/06/2023)   Overall Financial Resource Strain (CARDIA)    Difficulty of Paying Living Expenses: Not very hard  Food Insecurity: No Food Insecurity (01/06/2023)   Hunger Vital Sign    Worried About Running Out  of Food in the Last Year: Never true    Ran Out of Food in the Last Year: Never true  Transportation Needs: No Transportation Needs (01/06/2023)   PRAPARE - Administrator, Civil Service (Medical): No    Lack of Transportation (Non-Medical): No  Physical Activity: Insufficiently Active (01/06/2023)   Exercise Vital Sign    Days of Exercise per Week: 2 days    Minutes of Exercise per Session: 40 min  Stress: No Stress Concern Present (01/08/2023)   [employer] of Occupational Health - Occupational Stress Questionnaire    Feeling of Stress : Only a little  Social Connections: Socially Integrated (01/06/2023)   Social Connection and Isolation Panel [NHANES]    Frequency of Communication with Friends and Family: Three times a week    Frequency of Social Gatherings with Friends and Family: Once a week    Attends Religious Services: More than 4 times per  year    Active Member of Clubs or Organizations: Yes    Attends Engineer, structural: More than 4 times per year    Marital Status: Married     Family History: The patient's family history includes Asthma in her sister; Bipolar disorder in her sister; Cancer in her father; Diabetes in her mother; Heart attack (age of onset: 54) in her mother; Heart disease in her mother; Hypertension in her brother, father, mother, and sister; Seizures in her sister.  ROS:   Please see the history of present illness.    (+) Bilateral knee pain (+) Chronic LE edema (+) Exertional fatigue and mild DOE (+) Snoring All other systems reviewed and are negative.  EKGs/Labs/Other Studies Reviewed:     EKG:  EKG is personally reviewed. 08/30/2022: Sinus rhythm. Rate 69 bpm.  Recent Labs: 02/01/2023: ALT 77; BUN 13; Creatinine, Ser 0.69; Hemoglobin 13.3; Platelets 227; Potassium 4.3; Sodium 140   Recent Lipid Panel    Component Value Date/Time   CHOL 248 (H) 09/10/2022 0957   TRIG 73 09/10/2022 0957   HDL 83 09/10/2022 0957    CHOLHDL 3.0 09/10/2022 0957   CHOLHDL 4.3 08/22/2008 0536   VLDL 12 08/22/2008 0536   LDLCALC 153 (H) 09/10/2022 0957    Physical Exam:    VS:  BP 128/78   Pulse 74   Ht 5\' 7"  (1.702 m)   Wt 259 lb 12.8 oz (117.8 kg)   SpO2 98%   BMI 40.69 kg/m  , BMI Body mass index is 40.69 kg/m. GENERAL:  Well appearing HEENT: Pupils equal round and reactive, fundi not visualized, oral mucosa unremarkable NECK:  No jugular venous distention, waveform within normal limits, carotid upstroke brisk and symmetric, no bruits, no thyromegaly LUNGS:  Clear to auscultation bilaterally HEART:  RRR.  PMI not displaced or sustained,S1 and S2 within normal limits, no S3, no S4, no clicks, no rubs, no murmurs ABD:  Flat, positive bowel sounds normal in frequency in pitch, no bruits, no rebound, no guarding, no midline pulsatile mass, no hepatomegaly, no splenomegaly EXT:  2 plus pulses throughout, no edema, no cyanosis, no clubbing SKIN:  No rashes, no nodules NEURO:  Cranial nerves II through XII grossly intact, motor grossly intact throughout PSYCH:  Cognitively intact, oriented to person place and time   ASSESSMENT/PLAN:    # Coronary Artery Disease Stable on Aspirin 81mg  daily and Rosuvastatin. -Continue current medications. -Check lipid panel today.  # Hypertension Blood pressure readings at home have been within normal range, slightly elevated in office today. Stable on Hydrochlorothiazide and Valsartan. -Continue current medications.  # Post Knee Surgery Reports of suture allergies and infections post-surgery. Currently undergoing physical therapy. -Continue physical therapy.  # Lower Extremity Swelling Noted bilateral lower extremity swelling, more pronounced on the right. No signs of lymphedema or pitting edema on examination. -Consider compression therapy.  # Weight Gain Patient reports weight gain and craving for sweets. -Refer to pharmacist to discuss potential use of GLP-1  medications for weight loss, pending insurance approval. -Check Hemoglobin A1c today.  # Liver Enzymes Elevated ALT noted in November, possibly due to excessive Tylenol use. -Check comprehensive metabolic panel today.  Follow-up in 1 year, or sooner if started on GLP-1 medication.     Screening for Secondary Hypertension:     08/30/2022   10:27 AM 10/02/2022    7:04 AM  Causes  Drugs/Herbals Screened      - Comments limits salt.  no caffeine.  no EtOH.  No NSAIDS.   Renovascular HTN Screened      - Comments check renal Dopplers   Sleep Apnea Screened      - Comments snoring, daytime somnolence   Thyroid Disease Screened Screened  Hyperaldosteronism N/A   Pheochromocytoma N/A   Cushing's Syndrome N/A   Hyperparathyroidism N/A   Coarctation of the Aorta Screened      - Comments BP symmetric   Compliance Screened     Relevant Labs/Studies:    Latest Ref Rng & Units 02/01/2023    8:52 AM 10/18/2022    9:09 AM 09/10/2022    9:57 AM  Basic Labs  Sodium 134 - 144 mmol/L 140  142  136   Potassium 3.5 - 5.2 mmol/L 4.3  3.8  4.4   Creatinine 0.57 - 1.00 mg/dL 1.61  0.96  0.45        Disposition:    FU with Enjoli Tidd C. Duke Salvia, MD, [facility] in 1 year  Medication Adjustments/Labs and Tests Ordered: Current medicines are reviewed at length with the patient today.  Concerns regarding medicines are outlined above.   Orders Placed This Encounter  Procedures   Lipid panel   Comprehensive metabolic panel   HgB A1c   AMB Referral to [facility]   No orders of the defined types were placed in this encounter.    Signed, Chilton Si, MD  05/27/2023 4:32 PM    [facility]

## 2023-05-27 NOTE — Patient Instructions (Signed)
 Medication Instructions:  Your physician recommends that you continue on your current medications as directed. Please refer to the Current Medication list given to you today.   Labwork: A1C/CMET/LIPID PANEL TODAY   Testing/Procedures: NONE  Follow-Up: 1 YEAR   PHARM D   Any Other Special Instructions Will Be Listed Below (If Applicable).     If you need a refill on your cardiac medications before your next appointment, please call your pharmacy.

## 2023-05-28 LAB — COMPREHENSIVE METABOLIC PANEL
ALT: 30 IU/L (ref 0–32)
AST: 29 IU/L (ref 0–40)
Albumin: 4.6 g/dL (ref 3.8–4.9)
Alkaline Phosphatase: 132 IU/L — ABNORMAL HIGH (ref 44–121)
BUN/Creatinine Ratio: 21 (ref 9–23)
BUN: 17 mg/dL (ref 6–24)
Bilirubin Total: 0.7 mg/dL (ref 0.0–1.2)
CO2: 28 mmol/L (ref 20–29)
Calcium: 9.8 mg/dL (ref 8.7–10.2)
Chloride: 95 mmol/L — ABNORMAL LOW (ref 96–106)
Creatinine, Ser: 0.82 mg/dL (ref 0.57–1.00)
Globulin, Total: 2.5 g/dL (ref 1.5–4.5)
Glucose: 89 mg/dL (ref 70–99)
Potassium: 3.5 mmol/L (ref 3.5–5.2)
Sodium: 141 mmol/L (ref 134–144)
Total Protein: 7.1 g/dL (ref 6.0–8.5)
eGFR: 86 mL/min/{1.73_m2} (ref >59)

## 2023-05-28 LAB — LIPID PANEL
Chol/HDL Ratio: 1.8 ratio (ref 0.0–4.4)
Cholesterol, Total: 151 mg/dL (ref 100–199)
HDL: 82 mg/dL (ref >39)
LDL Chol Calc (NIH): 56 mg/dL (ref 0–99)
Triglycerides: 68 mg/dL (ref 0–149)
VLDL Cholesterol Cal: 13 mg/dL (ref 5–40)

## 2023-05-28 LAB — HEMOGLOBIN A1C
Est. average glucose Bld gHb Est-mCnc: 126 mg/dL
Hgb A1c MFr Bld: 6 % — ABNORMAL HIGH (ref 4.8–5.6)

## 2023-06-05 ENCOUNTER — Encounter (HOSPITAL_BASED_OUTPATIENT_CLINIC_OR_DEPARTMENT_OTHER): Payer: Self-pay | Admitting: Cardiovascular Disease

## 2023-06-05 MED ORDER — ROSUVASTATIN CALCIUM 40 MG PO TABS: 40.0000 mg | ORAL_TABLET | Freq: Every day | ORAL | 3 refills | Status: DC

## 2023-06-05 MED ORDER — VALSARTAN 80 MG PO TABS: 80.0000 mg | ORAL_TABLET | Freq: Every day | ORAL | 3 refills | Status: DC

## 2023-06-09 ENCOUNTER — Other Ambulatory Visit: Payer: Self-pay | Admitting: Family Medicine

## 2023-06-25 ENCOUNTER — Encounter: Payer: Self-pay | Admitting: Family Medicine

## 2023-06-25 ENCOUNTER — Ambulatory Visit (INDEPENDENT_AMBULATORY_CARE_PROVIDER_SITE_OTHER): Payer: BC Managed Care – PPO | Admitting: Family Medicine

## 2023-06-25 ENCOUNTER — Encounter (HOSPITAL_BASED_OUTPATIENT_CLINIC_OR_DEPARTMENT_OTHER): Payer: Self-pay | Admitting: Cardiovascular Disease

## 2023-06-25 VITALS — BP 111/75 | HR 83 | Temp 98.1°F | Resp 16 | Ht 67.0 in | Wt 258.4 lb

## 2023-06-25 DIAGNOSIS — I10 Essential (primary) hypertension: Secondary | ICD-10-CM

## 2023-06-25 DIAGNOSIS — E66812 Obesity, class 2: Secondary | ICD-10-CM

## 2023-06-25 DIAGNOSIS — Z6839 Body mass index (BMI) 39.0-39.9, adult: Secondary | ICD-10-CM

## 2023-06-25 DIAGNOSIS — B3731 Acute candidiasis of vulva and vagina: Secondary | ICD-10-CM

## 2023-06-25 DIAGNOSIS — E785 Hyperlipidemia, unspecified: Secondary | ICD-10-CM

## 2023-06-25 DIAGNOSIS — K219 Gastro-esophageal reflux disease without esophagitis: Secondary | ICD-10-CM | POA: Diagnosis not present

## 2023-06-25 MED ORDER — FLUCONAZOLE 150 MG PO TABS: ORAL_TABLET | ORAL | 0 refills | Status: DC

## 2023-06-25 MED ORDER — MELOXICAM 15 MG PO TABS: 15.0000 mg | ORAL_TABLET | Freq: Every day | ORAL | 1 refills | Status: DC

## 2023-06-25 MED ORDER — HYDROCHLOROTHIAZIDE 25 MG PO TABS: 25.0000 mg | ORAL_TABLET | Freq: Every day | ORAL | 3 refills | Status: DC

## 2023-06-25 MED ORDER — GABAPENTIN 300 MG PO CAPS
300.0000 mg | ORAL_CAPSULE | Freq: Every day | ORAL | 3 refills | Status: DC
Start: 2023-06-25 — End: 2023-11-14

## 2023-06-25 MED ORDER — PANTOPRAZOLE SODIUM 40 MG PO TBEC
40.0000 mg | DELAYED_RELEASE_TABLET | Freq: Every day | ORAL | 3 refills | Status: DC
Start: 2023-06-25 — End: 2023-12-31

## 2023-06-26 ENCOUNTER — Encounter: Payer: Self-pay | Admitting: Family Medicine

## 2023-06-26 NOTE — Progress Notes (Signed)
 Established Patient Office Visit  Subjective    Patient ID: [name], female    DOB: 02/10/1971  Age: 53 y.o. MRN: [id number]  CC:  Chief Complaint  Patient presents with   Medication Refill    Swelling, and nerve pain    HPI [name] presents for follow up of hypertension. She also reports persistent vaginal yeast infection and would like a refill of her neurontin which she says helps her nerve pain.   Outpatient Encounter Medications as of 06/25/2023  Medication Sig   aspirin EC 81 MG tablet Take 1 tablet (81 mg total) by mouth daily. Swallow whole.   cetirizine (ZYRTEC) 10 MG tablet Take 10 mg by mouth daily as needed for allergies (alternating with Allegra).   fexofenadine (ALLEGRA) 60 MG tablet Take 60 mg by mouth daily as needed for allergies or rhinitis (alternating with Zyrtec).   fluconazole (DIFLUCAN) 150 MG tablet Take 1 po on day 1 and day 4 and day 7   gabapentin (NEURONTIN) 300 MG capsule Take 1 capsule (300 mg total) by mouth at bedtime.   rosuvastatin (CRESTOR) 40 MG tablet Take 1 tablet (40 mg total) by mouth daily.   valsartan (DIOVAN) 80 MG tablet Take 1 tablet (80 mg total) by mouth daily.   [DISCONTINUED] hydrochlorothiazide (HYDRODIURIL) 25 MG tablet Take 1 tablet (25 mg total) by mouth daily.   [DISCONTINUED] meloxicam (MOBIC) 15 MG tablet Take 1 tablet (15 mg total) by mouth daily.   [DISCONTINUED] pantoprazole (PROTONIX) 40 MG tablet Take 1 tablet (40 mg total) by mouth daily.   hydrochlorothiazide (HYDRODIURIL) 25 MG tablet Take 1 tablet (25 mg total) by mouth daily.   liothyronine (CYTOMEL) 5 MCG tablet Take 5 mcg by mouth daily. (Patient not taking: Reported on 05/27/2023)   meloxicam (MOBIC) 15 MG tablet Take 1 tablet (15 mg total) by mouth daily.   pantoprazole (PROTONIX) 40 MG tablet Take 1 tablet (40 mg total) by mouth daily.   No facility-administered encounter medications on file as of 06/25/2023.    Past Medical History:  Diagnosis Date    Anemia    Arthritis    CAD in native artery 05/27/2023   Depression    due to perimenopausal phase per pt.   Essential hypertension 08/30/2022   GERD (gastroesophageal reflux disease)    History of hiatal hernia    Postpartum care following cesarean delivery (4/27) 07/28/2015   Pregnancy induced hypertension    during pregnancy   Sleep apnea    no machine   Varicose veins    Vitamin D deficiency     Past Surgical History:  Procedure Laterality Date   ABDOMINAL EXPOSURE N/A 04/27/2019   Procedure: ABDOMINAL EXPOSURE;  Surgeon: Chuck Hint, MD;  Location: [facility] OR;  Service: Vascular;  Laterality: N/A;   ANTERIOR LUMBAR FUSION N/A 04/27/2019   Procedure: ANTERIOR LUMBAR INTERBODY FUSION LUMBAR FOUR-FIVE WITH INSTRUMENTATION AND ALLOGRAFT;  Surgeon: Estill Bamberg, MD;  Location: [facility] OR;  Service: Orthopedics;  Laterality: N/A;   CESAREAN SECTION N/A 07/28/2015   Procedure: CESAREAN SECTION;  Surgeon: Olivia Mackie, MD;  Location: [facility] ORS;  Service: Obstetrics;  Laterality: N/A;   ESOPHAGEAL MANOMETRY N/A 01/02/2017   Procedure: ESOPHAGEAL MANOMETRY (EM);  Surgeon: Kerin Salen, MD;  Location: [facility]COPY;  Service: Gastroenterology;  Laterality: N/A;   HERNIA REPAIR     hiatal hernia repair 03-06-17   INSERTION OF MESH N/A 03/06/2017   Procedure: INSERTION OF MESH;  Surgeon: Axel Filler, MD;  Location:  [facility] ORS;  Service: General;  Laterality: N/A;   TOE SURGERY Bilateral     Family History  Problem Relation Age of Onset   Heart attack Mother 63   Heart disease Mother    Diabetes Mother    Hypertension Mother    Cancer Father    Hypertension Father    Hypertension Sister    Asthma Sister    Seizures Sister    Bipolar disorder Sister    Hypertension Brother     Social History   Socioeconomic History   Marital status: Married    Spouse name: Not on file   Number of children: Not on file   Years of education: Not on file   Highest education level: 12th grade   Occupational History   Not on file  Tobacco Use   Smoking status: Former    Current packs/day: 0.00    Types: Cigarettes    Quit date: 03/06/2011    Years since quitting: 12.3   Smokeless tobacco: Never  Vaping Use   Vaping status: Never Used  Substance and Sexual Activity   Alcohol use: No    Alcohol/week: 0.0 standard drinks of alcohol   Drug use: No   Sexual activity: Yes  Other Topics Concern   Not on file  Social History Narrative   ** Merged History Encounter **       Social Drivers of Health   Financial Resource Strain: Low Risk  (06/20/2023)   Overall Financial Resource Strain (CARDIA)    Difficulty of Paying Living Expenses: Not very hard  Food Insecurity: No Food Insecurity (06/20/2023)   Hunger Vital Sign    Worried About Running Out of Food in the Last Year: Never true    Ran Out of Food in the Last Year: Never true  Transportation Needs: No Transportation Needs (06/20/2023)   PRAPARE - Administrator, Civil Service (Medical): No    Lack of Transportation (Non-Medical): No  Physical Activity: Insufficiently Active (06/20/2023)   Exercise Vital Sign    Days of Exercise per Week: 3 days    Minutes of Exercise per Session: 30 min  Stress: No Stress Concern Present (06/20/2023)   [employer] of Occupational Health - Occupational Stress Questionnaire    Feeling of Stress : Only a little  Social Connections: Socially Integrated (06/20/2023)   Social Connection and Isolation Panel [NHANES]    Frequency of Communication with Friends and Family: Three times a week    Frequency of Social Gatherings with Friends and Family: Once a week    Attends Religious Services: More than 4 times per year    Active Member of [employer] or Organizations: Yes    Attends Engineer, structural: More than 4 times per year    Marital Status: Married  Catering manager Violence: Not At Risk (01/08/2023)   Humiliation, Afraid, Rape, and Kick questionnaire    Fear of  Current or Ex-Partner: No    Emotionally Abused: No    Physically Abused: No    Sexually Abused: No    Review of Systems  All other systems reviewed and are negative.       Objective    BP 111/75   Pulse 83   Temp 98.1 F (36.7 C) (Oral)   Resp 16   Ht 5\' 7"  (1.702 m)   Wt 258 lb 6.4 oz (117.2 kg)   SpO2 97%   BMI 40.47 kg/m   Physical Exam Vitals and nursing note reviewed.  Constitutional:      General: She is not in acute distress.    Appearance: She is obese.  Cardiovascular:     Rate and Rhythm: Normal rate and regular rhythm.  Pulmonary:     Effort: Pulmonary effort is normal.     Breath sounds: Normal breath sounds.  Abdominal:     Palpations: Abdomen is soft.     Tenderness: There is no abdominal tenderness.  Neurological:     General: No focal deficit present.     Mental Status: She is alert and oriented to person, place, and time.         Assessment & Plan:   Essential hypertension  Hyperlipidemia, unspecified hyperlipidemia type  Gastroesophageal reflux disease without esophagitis -     Pantoprazole Sodium; Take 1 tablet (40 mg total) by mouth daily.  Dispense: 90 tablet; Refill: 3  Class 2 severe obesity due to excess calories with serious comorbidity and body mass index (BMI) of 39.0 to 39.9 in adult [facility])  Yeast vaginitis  Other orders -     Fluconazole; Take 1 po on day 1 and day 4 and day 7  Dispense: 1 tablet; Refill: 0 -     hydroCHLOROthiazide; Take 1 tablet (25 mg total) by mouth daily.  Dispense: 90 tablet; Refill: 3 -     Meloxicam; Take 1 tablet (15 mg total) by mouth daily.  Dispense: 90 tablet; Refill: 1 -     Gabapentin; Take 1 capsule (300 mg total) by mouth at bedtime.  Dispense: 90 capsule; Refill: 3     Return in about 6 months (around 12/26/2023) for follow up.   Tommie Raymond, MD

## 2023-07-04 NOTE — Progress Notes (Signed)
 Patient ID: [name]                 DOB: 1971-02-11                    MRN: [id number]     HPI: [name] is a 53 y.o. female patient referred to pharmacy clinic by Dr. Duke Salvia to initiate GLP1-RA therapy. PMH is significant for non-obstructive CAD, hypertension, pregnancy-induced hypertension, OSA, and GERD, and obesity. Most recent BMI 41.0.  Pre surgery (11/2022 and 02/2023) weight: 218 lbs Baseline weight and BMI: 262 lbs, BMI 41.0 Current meds that affect weight: hydrochlorothiazide 25 mg (patient stopped taking liothyronine 4 months ago)  Patient has undergone double knee surgeries complicated by suture allergies, leading to infections at the incision sites. She is currently working with PT as she experiences persistent numbness and swelling. Compression socks are not helpful for swelling reduction. She notes ~44 lb weight gain since the surgeries. Patient has been making major changes to lifestyle, including a balanced healthy diet and exercise (walking, weight lifting, PT exercises) every day. She is discouraged that she is not seeing weight loss progress despite her efforts. Fasting glucose 89, last A1C 6.0% (05/27/23). Of note, she was previously on liothyronine but indicated she does not have a history of hypothyroidism. Last TSH 1.3 in 2023. She stopped this medication 4 months ago, and did not notice any changes in her weight at the time. Was prescribed by [facility].   At today's visit, discussed potential GLP-1 options for weight loss, Wegovy and Zepbound. Unfortunately, her insurance [employer] does not cover these medications. Discussed alternative pricing options including discounted cash price on [employer], which is out of her budget. Patient does have a history of sleep apnea, and her previous sleep study a few years ago indicated she has mild OSA without need for CPAP, discussed possibility of getting Zepbound covered under OSA indication but she would require repeat testing  for insurance approval purposes.    Diet: major changes to diet since knee surgeries. No fried food, balanced protein, salads and vegetables Drinks: lots of water, very occasional juice  Exercise: walks every day, lifts weights daily, strength exercises at PT/home   Family History: Mother: diabetes, MI (age 47), heart disease, HTN Father: cancer, HTN Sister: asthma, HTN Brother: HTN   Social History:  Former cigarette smoker, quit 2012. Does not drink alcohol or use illicit drugs.  Labs: Lab Results  Component Value Date   HGBA1C 6.0 (H) 05/27/2023    Wt Readings from Last 1 Encounters:  06/25/23 258 lb 6.4 oz (117.2 kg)    BP Readings from Last 1 Encounters:  06/25/23 111/75   Pulse Readings from Last 1 Encounters:  06/25/23 83       Component Value Date/Time   CHOL 151 05/27/2023 1032   TRIG 68 05/27/2023 1032   HDL 82 05/27/2023 1032   CHOLHDL 1.8 05/27/2023 1032   CHOLHDL 4.3 08/22/2008 0536   VLDL 12 08/22/2008 0536   LDLCALC 56 05/27/2023 1032    Past Medical History:  Diagnosis Date   Anemia    Arthritis    CAD in native artery 05/27/2023   Depression    due to perimenopausal phase per pt.   Essential hypertension 08/30/2022   GERD (gastroesophageal reflux disease)    History of hiatal hernia    Postpartum care following cesarean delivery (4/27) 07/28/2015   Pregnancy induced hypertension    during pregnancy   Sleep apnea  no machine   Varicose veins    Vitamin D deficiency     Current Outpatient Medications on File Prior to Visit  Medication Sig Dispense Refill   aspirin EC 81 MG tablet Take 1 tablet (81 mg total) by mouth daily. Swallow whole. 90 tablet 3   cetirizine (ZYRTEC) 10 MG tablet Take 10 mg by mouth daily as needed for allergies (alternating with Allegra).     fexofenadine (ALLEGRA) 60 MG tablet Take 60 mg by mouth daily as needed for allergies or rhinitis (alternating with Zyrtec).     fluconazole (DIFLUCAN) 150 MG tablet Take  1 po on day 1 and day 4 and day 7 1 tablet 0   gabapentin (NEURONTIN) 300 MG capsule Take 1 capsule (300 mg total) by mouth at bedtime. 90 capsule 3   hydrochlorothiazide (HYDRODIURIL) 25 MG tablet Take 1 tablet (25 mg total) by mouth daily. 90 tablet 3   liothyronine (CYTOMEL) 5 MCG tablet Take 5 mcg by mouth daily. (Patient not taking: Reported on 05/27/2023)     meloxicam (MOBIC) 15 MG tablet Take 1 tablet (15 mg total) by mouth daily. 90 tablet 1   pantoprazole (PROTONIX) 40 MG tablet Take 1 tablet (40 mg total) by mouth daily. 90 tablet 3   rosuvastatin (CRESTOR) 40 MG tablet Take 1 tablet (40 mg total) by mouth daily. 90 tablet 3   valsartan (DIOVAN) 80 MG tablet Take 1 tablet (80 mg total) by mouth daily. 90 tablet 3   No current facility-administered medications on file prior to visit.    Allergies  Allergen Reactions   Aleve [Naproxen] Shortness Of Breath   Excedrin Tension Headache [Acetaminophen-Caffeine] Shortness Of Breath   Ibuprofen Anaphylaxis   Tramadol Nausea And Vomiting and Swelling     Assessment/Plan:  1. Weight loss   Assessment:  Patient has experienced 44 lb weight gain since her double knee replacement surgeries 8 months ago.  She is very committed to healthy habits including eating well, staying well hydrated, and exercising regularly.  Candidate for GLP-1 therapy with weight loss indication given BMI > 30. Not a candidate for Ozempic/Mounjaro given A1C < 6.5% and no history of DM.  Plan: Patient's insurance does not cover either Wegovy or Zepbound for weight loss.  She is agreeable to undergoing another sleep study and possible titration study both for her own health and to explore possible OSA indication for Zepbound (at this time not covered).  Recognized patient's efforts in lifestyle modifications and recommended she continue to keep up with her healthy eating and exercise regimen for weight loss as well as her overall health.    Thank  you,  Lendon Ka, PharmD Candidate 2025 APPE [facility] Extern 07/08/23  Olene Floss, Pharm.D, BCACP, BCPS, CPP [facility] Division of [facility] [address], [place], [place] [address]  Phone: [phone]; Fax: [phone]

## 2023-07-08 ENCOUNTER — Ambulatory Visit: Payer: BC Managed Care – PPO | Attending: Cardiovascular Disease | Admitting: Pharmacist

## 2023-07-08 ENCOUNTER — Telehealth (HOSPITAL_BASED_OUTPATIENT_CLINIC_OR_DEPARTMENT_OTHER): Payer: Self-pay | Admitting: *Deleted

## 2023-07-08 ENCOUNTER — Encounter: Payer: Self-pay | Admitting: Pharmacist

## 2023-07-08 ENCOUNTER — Encounter: Payer: Self-pay | Admitting: Family Medicine

## 2023-07-08 VITALS — Wt 262.0 lb

## 2023-07-08 DIAGNOSIS — I10 Essential (primary) hypertension: Secondary | ICD-10-CM

## 2023-07-08 DIAGNOSIS — Z6841 Body Mass Index (BMI) 40.0 and over, adult: Secondary | ICD-10-CM | POA: Diagnosis not present

## 2023-07-08 DIAGNOSIS — R0683 Snoring: Secondary | ICD-10-CM

## 2023-07-08 DIAGNOSIS — E66813 Obesity, class 3: Secondary | ICD-10-CM | POA: Diagnosis not present

## 2023-07-08 DIAGNOSIS — R400 Somnolence: Secondary | ICD-10-CM

## 2023-07-08 DIAGNOSIS — E669 Obesity, unspecified: Secondary | ICD-10-CM | POA: Insufficient documentation

## 2023-07-08 NOTE — Telephone Encounter (Signed)
 Patients insurance did not cover Itamar home sleep study when originally ordered , Dr Duke Salvia approved split night  Will try ordering Donnie Coffin again to see if insurance will cover, if not will order split night

## 2023-07-08 NOTE — Progress Notes (Signed)
 Order placed and sent to sleep pool

## 2023-07-08 NOTE — Telephone Encounter (Signed)
-----   Message from Olene Floss sent at 07/08/2023  2:20 PM EDT ----- I think she needs new order for sleep study ----- Message ----- From: Demetrios Loll, LPN Sent: 04/07/1094   1:06 PM EDT To: Olene Floss, RPH-CPP  I think so because there are no active sleep orders in the pts appt desk. Thanks, Larita Fife ----- Message ----- From: Olene Floss, RPH-CPP Sent: 07/08/2023  12:42 PM EDT To: [facility] Sleep Studies  Does order for sleep study need to be re-entered?

## 2023-07-09 NOTE — Addendum Note (Signed)
**Note De-Identified [name] Obfuscation** Addended by: Demetrios Loll on: 07/09/2023 08:44 AM   Modules accepted: Orders

## 2023-07-09 NOTE — Telephone Encounter (Signed)
**Note De-Identified Aspen Lawrance Obfuscation** Per Morrie Sheldon with [employer] [place], [place] Code: 09811 (Itamar-HST) is considered investigational and is not covered. Per Morrie Sheldon, CPT Code: 91478 (Split Night Sleep Study) is covered. Reference #: [id number]  I have ordered the Spilt Night Sleep Study and have transferred it to the sleep lab so they can contact the pt to schedule the test.

## 2023-07-30 ENCOUNTER — Encounter: Payer: Self-pay | Admitting: Family Medicine

## 2023-07-30 ENCOUNTER — Ambulatory Visit (INDEPENDENT_AMBULATORY_CARE_PROVIDER_SITE_OTHER): Admitting: Family Medicine

## 2023-07-30 VITALS — BP 119/81 | HR 61 | Wt 271.6 lb

## 2023-07-30 DIAGNOSIS — Z7689 Persons encountering health services in other specified circumstances: Secondary | ICD-10-CM | POA: Diagnosis not present

## 2023-07-30 DIAGNOSIS — Z6841 Body Mass Index (BMI) 40.0 and over, adult: Secondary | ICD-10-CM | POA: Diagnosis not present

## 2023-07-30 DIAGNOSIS — E66813 Obesity, class 3: Secondary | ICD-10-CM

## 2023-07-30 MED ORDER — TIRZEPATIDE 2.5 MG/0.5ML ~~LOC~~ SOAJ: 2.5000 mg | SUBCUTANEOUS | 0 refills | Status: DC

## 2023-07-31 ENCOUNTER — Encounter: Payer: Self-pay | Admitting: Family Medicine

## 2023-07-31 NOTE — Progress Notes (Signed)
 Established Patient Office Visit  Subjective    Patient ID: [name], female    DOB: [birth year]  Age: 53 y.o. MRN: [id number]  CC:  Chief Complaint  Patient presents with   Weight Management Screening    HPI [name] presents for routine weight management. She reports that she has not been successful with phentermine . She would like to discuss other options. She is prediabetic.   Outpatient Encounter Medications as of 07/30/2023  Medication Sig   aspirin  EC 81 MG tablet Take 1 tablet (81 mg total) by mouth daily. Swallow whole.   cetirizine (ZYRTEC) 10 MG tablet Take 10 mg by mouth daily as needed for allergies (alternating with Allegra).   fexofenadine (ALLEGRA) 60 MG tablet Take 60 mg by mouth daily as needed for allergies or rhinitis (alternating with Zyrtec).   fluconazole  (DIFLUCAN ) 150 MG tablet Take 1 po on day 1 and day 4 and day 7   gabapentin  (NEURONTIN ) 300 MG capsule Take 1 capsule (300 mg total) by mouth at bedtime.   hydrochlorothiazide  (HYDRODIURIL ) 25 MG tablet Take 1 tablet (25 mg total) by mouth daily.   liothyronine (CYTOMEL) 5 MCG tablet Take 5 mcg by mouth daily.   meloxicam  (MOBIC ) 15 MG tablet Take 1 tablet (15 mg total) by mouth daily.   pantoprazole  (PROTONIX ) 40 MG tablet Take 1 tablet (40 mg total) by mouth daily.   rosuvastatin  (CRESTOR ) 40 MG tablet Take 1 tablet (40 mg total) by mouth daily.   tirzepatide (MOUNJARO) 2.5 MG/0.5ML Pen Inject 2.5 mg into the skin once a week.   valsartan  (DIOVAN ) 80 MG tablet Take 1 tablet (80 mg total) by mouth daily.   No facility-administered encounter medications on file as of 07/30/2023.    Past Medical History:  Diagnosis Date   Anemia    Arthritis    CAD in native artery 05/27/2023   Depression    due to perimenopausal phase per pt.   Essential hypertension 08/30/2022   GERD (gastroesophageal reflux disease)    History of hiatal hernia    Postpartum care following cesarean delivery (4/27) 07/28/2015    Pregnancy induced hypertension    during pregnancy   Sleep apnea    no machine   Varicose veins    Vitamin D deficiency     Past Surgical History:  Procedure Laterality Date   ABDOMINAL EXPOSURE N/A 04/27/2019   Procedure: ABDOMINAL EXPOSURE;  Surgeon: Dannis Dy, MD;  Location: [facility] OR;  Service: Vascular;  Laterality: N/A;   ANTERIOR LUMBAR FUSION N/A 04/27/2019   Procedure: ANTERIOR LUMBAR INTERBODY FUSION LUMBAR FOUR-FIVE WITH INSTRUMENTATION AND ALLOGRAFT;  Surgeon: Virl Grimes, MD;  Location: [facility] OR;  Service: Orthopedics;  Laterality: N/A;   CESAREAN SECTION N/A 07/28/2015   Procedure: CESAREAN SECTION;  Surgeon: Meriam Stamp, MD;  Location: [facility] ORS;  Service: Obstetrics;  Laterality: N/A;   ESOPHAGEAL MANOMETRY N/A 01/02/2017   Procedure: ESOPHAGEAL MANOMETRY (EM);  Surgeon: Genell Ken, MD;  Location: [facility] ENDOSCOPY;  Service: Gastroenterology;  Laterality: N/A;   HERNIA REPAIR     hiatal hernia repair 03-06-17   INSERTION OF MESH N/A 03/06/2017   Procedure: INSERTION OF MESH;  Surgeon: Shela Derby, MD;  Location: [facility] ORS;  Service: General;  Laterality: N/A;   TOE SURGERY Bilateral     Family History  Problem Relation Age of Onset   Heart attack Mother 69   Heart disease Mother    Diabetes Mother    Hypertension Mother    Cancer  Father    Hypertension Father    Hypertension Sister    Asthma Sister    Seizures Sister    Bipolar disorder Sister    Hypertension Brother     Social History   Socioeconomic History   Marital status: Married    Spouse name: Not on file   Number of children: Not on file   Years of education: Not on file   Highest education level: 12th grade  Occupational History   Not on file  Tobacco Use   Smoking status: Former    Current packs/day: 0.00    Types: Cigarettes    Quit date: 03/06/2011    Years since quitting: 12.4   Smokeless tobacco: Never  Vaping Use   Vaping status: Never Used  Substance and Sexual Activity    Alcohol use: No    Alcohol/week: 0.0 standard drinks of alcohol   Drug use: No   Sexual activity: Yes  Other Topics Concern   Not on file  Social History Narrative   ** Merged History Encounter **       Social Drivers of Health   Financial Resource Strain: Low Risk  (06/20/2023)   Overall Financial Resource Strain (CARDIA)    Difficulty of Paying Living Expenses: Not very hard  Food Insecurity: No Food Insecurity (06/20/2023)   Hunger Vital Sign    Worried About Running Out of Food in the Last Year: Never true    Ran Out of Food in the Last Year: Never true  Transportation Needs: No Transportation Needs (06/20/2023)   PRAPARE - Administrator, Civil Service (Medical): No    Lack of Transportation (Non-Medical): No  Physical Activity: Insufficiently Active (06/20/2023)   Exercise Vital Sign    Days of Exercise per Week: 3 days    Minutes of Exercise per Session: 30 min  Stress: No Stress Concern Present (06/20/2023)   [employer] of Occupational Health - Occupational Stress Questionnaire    Feeling of Stress : Only a little  Social Connections: Socially Integrated (06/20/2023)   Social Connection and Isolation Panel [NHANES]    Frequency of Communication with Friends and Family: Three times a week    Frequency of Social Gatherings with Friends and Family: Once a week    Attends Religious Services: More than 4 times per year    Active Member of [employer] or Organizations: Yes    Attends Engineer, structural: More than 4 times per year    Marital Status: Married  Catering manager Violence: Not At Risk (01/08/2023)   Humiliation, Afraid, Rape, and Kick questionnaire    Fear of Current or Ex-Partner: No    Emotionally Abused: No    Physically Abused: No    Sexually Abused: No    Review of Systems  All other systems reviewed and are negative.       Objective    BP 119/81 (BP Location: Right Arm, Patient Position: Sitting, Cuff Size: Normal)   Pulse 61    Wt 271 lb 9.6 oz (123.2 kg)   SpO2 95%   BMI 42.54 kg/m   Physical Exam Vitals and nursing note reviewed.  Constitutional:      General: She is not in acute distress.    Appearance: She is obese.  Cardiovascular:     Rate and Rhythm: Normal rate and regular rhythm.  Pulmonary:     Effort: Pulmonary effort is normal.     Breath sounds: Normal breath sounds.  Abdominal:  Palpations: Abdomen is soft.     Tenderness: There is no abdominal tenderness.  Neurological:     General: No focal deficit present.     Mental Status: She is alert and oriented to person, place, and time.         Assessment & Plan:   Encounter for weight management  Class 3 severe obesity due to excess calories with serious comorbidity and body mass index (BMI) of 40.0 to 44.9 in adult [facility])  Other orders -     Tirzepatide; Inject 2.5 mg into the skin once a week.  Dispense: 2 mL; Refill: 0     Return in about 4 weeks (around 08/27/2023) for follow up, weight management.   Arlo Lama, MD

## 2023-08-15 ENCOUNTER — Ambulatory Visit: Admitting: Family Medicine

## 2023-10-10 ENCOUNTER — Ambulatory Visit: Payer: Self-pay

## 2023-10-10 ENCOUNTER — Ambulatory Visit
Admission: EM | Admit: 2023-10-10 | Discharge: 2023-10-10 | Disposition: A | Discharge status: Home / Self Care | Attending: Emergency Medicine | Admitting: Emergency Medicine

## 2023-10-10 DIAGNOSIS — M5441 Lumbago with sciatica, right side: Secondary | ICD-10-CM

## 2023-10-10 DIAGNOSIS — M5442 Lumbago with sciatica, left side: Secondary | ICD-10-CM | POA: Diagnosis not present

## 2023-10-10 MED ORDER — CYCLOBENZAPRINE HCL 10 MG PO TABS: 10.0000 mg | ORAL_TABLET | Freq: Every day | ORAL | 0 refills | Status: DC

## 2023-10-10 MED ORDER — PREDNISONE 10 MG (21) PO TBPK: ORAL_TABLET | Freq: Every day | ORAL | 0 refills | Status: DC

## 2023-10-10 MED ORDER — KETOROLAC TROMETHAMINE 30 MG/ML IJ SOLN
30.0000 mg | Freq: Once | INTRAMUSCULAR | Status: AC
  Administered 2023-10-10: 30 mg via INTRAMUSCULAR

## 2023-10-10 NOTE — ED Provider Notes (Signed)
 [name]    CSN: [id number] Arrival date & time: 10/10/23  1413      History   Chief Complaint Chief Complaint  Patient presents with   Leg Pain    HPI [name] is a 53 y.o. female.   Patient presents for evaluation of pain starting at the right buttocks radiating down the posterior of the right leg with numbness present to the bilateral feet beginning 2 weeks ago without precipitating event, injury or trauma.  Numbness affecting the entirety of the foot.  Pain exacerbated with standing and with lying.  Over the past 3 days has had interference with sleeping.  Endorses that when the leg is stretched out it causes severe pain described as a throbbing.  Has attempted use of ibuprofen  and magnesium.  History of bilateral knee replacements.  Denies urinary or bowel incontinence.   Past Medical History:  Diagnosis Date   Anemia    Arthritis    CAD in native artery 05/27/2023   Depression    due to perimenopausal phase per pt.   Essential hypertension 08/30/2022   GERD (gastroesophageal reflux disease)    History of hiatal hernia    Postpartum care following cesarean delivery (4/27) 07/28/2015   Pregnancy induced hypertension    during pregnancy   Sleep apnea    no machine   Varicose veins    Vitamin D deficiency     Patient Active Problem List   Diagnosis Date Noted   Obesity 07/08/2023   CAD in native artery 05/27/2023   Essential hypertension 08/30/2022   Radiculopathy 04/27/2019   S/P Nissen fundoplication (without gastrostomy tube) procedure 03/06/2017   Preeclampsia 07/28/2015   Postpartum care following cesarean delivery (4/27) 07/28/2015   No significant past medical history    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   HYPOKALEMIA 09/24/2008   TOBACCO ABUSE 09/24/2008   PHLEBITIS&THROMBOPHLEB SUP VESSELS LOWER EXTREM 03/03/2007    Past Surgical History:  Procedure Laterality Date   ABDOMINAL EXPOSURE N/A 04/27/2019   Procedure: ABDOMINAL  EXPOSURE;  Surgeon: Eliza Lonni RAMAN, MD;  Location: [facility] OR;  Service: Vascular;  Laterality: N/A;   ANTERIOR LUMBAR FUSION N/A 04/27/2019   Procedure: ANTERIOR LUMBAR INTERBODY FUSION LUMBAR FOUR-FIVE WITH INSTRUMENTATION AND ALLOGRAFT;  Surgeon: Beuford Anes, MD;  Location: [facility] OR;  Service: Orthopedics;  Laterality: N/A;   CESAREAN SECTION N/A 07/28/2015   Procedure: CESAREAN SECTION;  Surgeon: Charlie Flowers, MD;  Location: [facility] ORS;  Service: Obstetrics;  Laterality: N/A;   ESOPHAGEAL MANOMETRY N/A 01/02/2017   Procedure: ESOPHAGEAL MANOMETRY (EM);  Surgeon: Saintclair Jasper, MD;  Location: [facility] ENDOSCOPY;  Service: Gastroenterology;  Laterality: N/A;   HERNIA REPAIR     hiatal hernia repair 03-06-17   INSERTION OF MESH N/A 03/06/2017   Procedure: INSERTION OF MESH;  Surgeon: Rubin Calamity, MD;  Location: [facility] ORS;  Service: General;  Laterality: N/A;   KNEE SURGERY Bilateral    R knee nov 2024, L knee aug 2024   TOE SURGERY Bilateral     OB History     Gravida  1   Para  1   Term  1   Preterm  0   AB  0   Living  1      SAB  0   IAB  0   Ectopic  0   Multiple  0   Live Births  1            Home Medications    Prior to Admission  medications   Medication Sig Start Date End Date Taking? Authorizing Provider  cyclobenzaprine  (FLEXERIL ) 10 MG tablet Take 1 tablet (10 mg total) by mouth at bedtime. 10/10/23  Yes Bert Ptacek R, NP  predniSONE  (STERAPRED UNI-PAK 21 TAB) 10 MG (21) TBPK tablet Take by mouth daily. Take 6 tabs by mouth daily  for 1 days, then 5 tabs for 1 days, then 4 tabs for 1 days, then 3 tabs for 1 days, 2 tabs for 1 days, then 1 tab by mouth daily for 1 days 10/10/23  Yes Mael Delap, Shelba R, NP  aspirin  EC 81 MG tablet Take 1 tablet (81 mg total) by mouth daily. Swallow whole. 09/27/22   Raford Riggs, MD  cetirizine (ZYRTEC) 10 MG tablet Take 10 mg by mouth daily as needed for allergies (alternating with Allegra).    [provider]   fexofenadine (ALLEGRA) 60 MG tablet Take 60 mg by mouth daily as needed for allergies or rhinitis (alternating with Zyrtec).    [provider]  fluconazole  (DIFLUCAN ) 150 MG tablet Take 1 po on day 1 and day 4 and day 7 Patient not taking: Reported on 10/10/2023 06/25/23   Tanda Bleacher, MD  gabapentin  (NEURONTIN ) 300 MG capsule Take 1 capsule (300 mg total) by mouth at bedtime. Patient not taking: Reported on 10/10/2023 06/25/23   Tanda Bleacher, MD  hydrochlorothiazide  (HYDRODIURIL ) 25 MG tablet Take 1 tablet (25 mg total) by mouth daily. 06/25/23   Tanda Bleacher, MD  liothyronine (CYTOMEL) 5 MCG tablet Take 5 mcg by mouth daily. Patient not taking: Reported on 10/10/2023 06/07/22   [provider]  meloxicam  (MOBIC ) 15 MG tablet Take 1 tablet (15 mg total) by mouth daily. 06/25/23   Tanda Bleacher, MD  pantoprazole  (PROTONIX ) 40 MG tablet Take 1 tablet (40 mg total) by mouth daily. 06/25/23   Tanda Bleacher, MD  rosuvastatin  (CRESTOR ) 40 MG tablet Take 1 tablet (40 mg total) by mouth daily. 06/05/23 09/03/23  Raford Riggs, MD  tirzepatide  (MOUNJARO ) 2.5 MG/0.5ML Pen Inject 2.5 mg into the skin once a week. Patient not taking: Reported on 10/10/2023 07/30/23   Tanda Bleacher, MD  valsartan  (DIOVAN ) 80 MG tablet Take 1 tablet (80 mg total) by mouth daily. 06/05/23   Raford Riggs, MD    Family History Family History  Problem Relation Age of Onset   Heart attack Mother 33   Heart disease Mother    Diabetes Mother    Hypertension Mother    Cancer Father    Hypertension Father    Hypertension Sister    Asthma Sister    Seizures Sister    Bipolar disorder Sister    Hypertension Brother     Social History Social History   Tobacco Use   Smoking status: Former    Current packs/day: 0.00    Types: Cigarettes    Quit date: 03/06/2011    Years since quitting: 12.6   Smokeless tobacco: Never  Vaping Use   Vaping status: Never Used  Substance Use Topics   Alcohol use:  No    Alcohol/week: 0.0 standard drinks of alcohol   Drug use: No     Allergies   Aleve [naproxen], Excedrin tension headache [acetaminophen -caffeine], Ibuprofen , and Tramadol   Review of Systems Review of Systems   Physical Exam Triage Vital Signs ED Triage Vitals  Encounter Vitals Group     BP 10/10/23 1419 108/77     Girls Systolic BP Percentile --      Girls Diastolic  BP Percentile --      Boys Systolic BP Percentile --      Boys Diastolic BP Percentile --      Pulse Rate 10/10/23 1419 90     Resp 10/10/23 1419 19     Temp 10/10/23 1419 97.7 F (36.5 C)     Temp Source 10/10/23 1419 Oral     SpO2 10/10/23 1419 98 %     Weight --      Height --      Head Circumference --      Peak Flow --      Pain Score 10/10/23 1418 9     Pain Loc --      Pain Education --      Exclude from Growth Chart --    No data found.  Updated Vital Signs BP 108/77 (BP Location: Left Arm)   Pulse 90   Temp 97.7 F (36.5 C) (Oral)   Resp 19   LMP  (LMP Unknown) Comment: 3 months ago  SpO2 98%   Visual Acuity Right Eye Distance:   Left Eye Distance:   Bilateral Distance:    Right Eye Near:   Left Eye Near:    Bilateral Near:     Physical Exam Constitutional:      Appearance: Normal appearance.  Eyes:     Extraocular Movements: Extraocular movements intact.  Pulmonary:     Effort: Pulmonary effort is normal.  Musculoskeletal:     Comments: Tenderness present to the right lower lumbar region extending into the buttocks, no ecchymosis swelling or deformity, able to sit erect without complication, positive straight leg test to the right side  Neurological:     Mental Status: She is alert and oriented to person, place, and time. Mental status is at baseline.      UC Treatments / Results  Labs (all labs ordered are listed, but only abnormal results are displayed) Labs Reviewed - No data to display  EKG   Radiology No results found.  Procedures Procedures  (including critical care time)  Medications Ordered in UC Medications  ketorolac  (TORADOL ) 30 MG/ML injection 30 mg (30 mg Intramuscular Given 10/10/23 1445)    Initial Impression / Assessment and Plan / UC Course  I have reviewed the triage vital signs and the nursing notes.  Pertinent labs & imaging results that were available during my care of the patient were reviewed by me and considered in my medical decision making (see chart for details).  Acute right-sided low back pain with bilateral sciatica  Etiology most likely muscular, denies injury therefore deferring imaging Toradol  IM given and prescribed prednisone  and Flexeril  for home use recommended supportive care through [facility], heat massage stretching with activity as tolerated, given written handout of back exercises and walker referral to orthopedics if no improvement seen Final Clinical Impressions(s) / UC Diagnoses   Final diagnoses:  Acute right-sided low back pain with bilateral sciatica     Discharge Instructions      Your pain is most likely caused by irritation to the muscles compressing the nerve causing the pain and numbness to your legs and feet  You have been given an injection of Toradol  to reduce inflammation and pain and ideally will start to see relief within the hour  Starting tomorrow take prednisone  every morning with food as directed to continue the viral process, may use Tylenol  or any topical medicines  May use muscle relaxant at bedtime for additional relief and allow you to  rest  You may use heating pad in 15 minute intervals as needed for additional comfort  Begin stretching affected area daily for 10 minutes as tolerated to further loosen muscles   When lying down place pillow underneath and between knees for support  Can try sleeping without pillow on firm mattress   Practice good posture: head back, shoulders back, chest forward, pelvis back and weight distributed evenly on both legs  If  pain persist after recommended treatment or reoccurs if may be beneficial to follow up with orthopedic specialist for evaluation, this doctor specializes in the bones and can manage your symptoms long-term with options such as but not limited to imaging, medications or physical therapy      ED Prescriptions     Medication Sig Dispense Auth. Provider   predniSONE  (STERAPRED UNI-PAK 21 TAB) 10 MG (21) TBPK tablet Take by mouth daily. Take 6 tabs by mouth daily  for 1 days, then 5 tabs for 1 days, then 4 tabs for 1 days, then 3 tabs for 1 days, 2 tabs for 1 days, then 1 tab by mouth daily for 1 days 21 tablet Xylah Early R, NP   cyclobenzaprine  (FLEXERIL ) 10 MG tablet Take 1 tablet (10 mg total) by mouth at bedtime. 10 tablet Enrica Corliss R, NP      PDMP not reviewed this encounter.   Teresa Shelba SAUNDERS, NP 10/10/23 1500

## 2023-10-10 NOTE — Discharge Instructions (Signed)
 Your pain is most likely caused by irritation to the muscles compressing the nerve causing the pain and numbness to your legs and feet  You have been given an injection of Toradol  to reduce inflammation and pain and ideally will start to see relief within the hour  Starting tomorrow take prednisone  every morning with food as directed to continue the viral process, may use Tylenol  or any topical medicines  May use muscle relaxant at bedtime for additional relief and allow you to rest  You may use heating pad in 15 minute intervals as needed for additional comfort  Begin stretching affected area daily for 10 minutes as tolerated to further loosen muscles   When lying down place pillow underneath and between knees for support  Can try sleeping without pillow on firm mattress   Practice good posture: head back, shoulders back, chest forward, pelvis back and weight distributed evenly on both legs  If pain persist after recommended treatment or reoccurs if may be beneficial to follow up with orthopedic specialist for evaluation, this doctor specializes in the bones and can manage your symptoms long-term with options such as but not limited to imaging, medications or physical therapy

## 2023-10-10 NOTE — ED Triage Notes (Signed)
 Pt being seen in UC for R leg pain for approx 2 weeks, but has been getting worse over last few days, gets worse at night, affecting sleep. Pt states that pain radiates down leg with numbness in certain areas. Pt reports hx of knee replacement surgery R knee nov 2024.

## 2023-10-10 NOTE — Telephone Encounter (Signed)
 FYI Only or Action Required?: FYI only for provider.  Patient was last seen in primary care on 07/30/2023 by Tanda Bleacher, MD.  Called Nurse Triage reporting No chief complaint on file..  Symptoms began several weeks ago.  Interventions attempted: OTC medications: Ibuprofen .  Symptoms are: gradually worsening.  Triage Disposition: See HCP Within 4 Hours (Or PCP Triage)  Patient/caregiver understands and will follow disposition?: Yes  Copied from CRM [id number]. Topic: Clinical - Red Word Triage >> Oct 10, 2023 11:44 AM Myrick T wrote: Red Word that prompted transfer to Nurse Triage: patient called stated she has pain in her back down into her buttocks and back of her legs. She has great pain in hte leg below the knee but her feet are numb. When she is sitting it is a throbbing pain.  Reason for Disposition  [1] Pain radiates into the thigh or further down the leg AND [2] both legs  Answer Assessment - Initial Assessment Questions 1. ONSET: When did the pain begin? (e.g., minutes, hours, days)     Two weeks ago  2. LOCATION: Where does it hurt? (upper, mid or lower back)     Lower back  3. SEVERITY: How bad is the pain?  (e.g., Scale 1-10; mild, moderate, or severe)     Moderate To Severe  4. PATTERN: Is the pain constant? (e.g., yes, no; constant, intermittent)      Constant  5. RADIATION: Does the pain shoot into your legs or somewhere else?     Down both legs  6. CAUSE:  What do you think is causing the back pain?      Unsure  7. BACK OVERUSE:  Any recent lifting of heavy objects, strenuous work or exercise?     No  8. MEDICINES: What have you taken so far for the pain? (e.g., nothing, acetaminophen , NSAIDS)     Ibuprofen   9. NEUROLOGIC SYMPTOMS: Do you have any weakness, numbness, or problems with bowel/bladder control?     Numbness in lower legs  10. OTHER SYMPTOMS: Do you have any other symptoms? (e.g., fever, abdomen pain, burning with  urination, blood in urine)       No  11. PREGNANCY: Is there any chance you are pregnant? When was your last menstrual period?       No and No  Protocols used: Back Pain-A-AH

## 2023-10-18 ENCOUNTER — Ambulatory Visit (HOSPITAL_BASED_OUTPATIENT_CLINIC_OR_DEPARTMENT_OTHER): Attending: Cardiovascular Disease | Admitting: Cardiology

## 2023-10-18 VITALS — Ht 67.0 in | Wt 255.0 lb

## 2023-10-18 DIAGNOSIS — G4733 Obstructive sleep apnea (adult) (pediatric): Secondary | ICD-10-CM | POA: Diagnosis not present

## 2023-10-18 DIAGNOSIS — I10 Essential (primary) hypertension: Secondary | ICD-10-CM | POA: Insufficient documentation

## 2023-10-18 DIAGNOSIS — R400 Somnolence: Secondary | ICD-10-CM

## 2023-10-18 DIAGNOSIS — Z6841 Body Mass Index (BMI) 40.0 and over, adult: Secondary | ICD-10-CM | POA: Diagnosis not present

## 2023-10-18 DIAGNOSIS — G4736 Sleep related hypoventilation in conditions classified elsewhere: Secondary | ICD-10-CM | POA: Diagnosis not present

## 2023-10-18 DIAGNOSIS — R0683 Snoring: Secondary | ICD-10-CM | POA: Diagnosis present

## 2023-10-22 NOTE — Procedures (Signed)
 Indications for Polysomnography The patient is a 53 year old Female who is 5' 7 and weighs 255.0 lbs.  Her BMI equals 40.0.  A diagnostic polysomnogram was performed to evaluate for obstructive sleep apnea.  After 128.0 minutes of sleep time the patient exhibited sufficient  respiratory events qualifying her for a CPAP trial which was then initiated.  Medication was administered at 2036.pantoprazoleValsartanRosuvastatinFiber SupplementAdvil PMZyrtec Polysomnogram Data A full night polysomnogram was performed recording the standard physiologic parameters including EEG, EOG, EMG, EKG, nasal and oral airflow.  Respiratory parameters of chest and abdominal movements are recorded with Pe[place]-Crystal motion transducers.   Oxygen saturation was recorded by pulse oximetry.  Sleep Architecture The total recording time of the diagnostic portion of the study was 163.0 minutes.  The total sleep time was 128.0 minutes.  During the diagnostic portion of the study, the patient spent 3.5% of total sleep time in Stage N1, 96.5% in Stage N2, 0.0% in  Stages N3, and 0.0% in REM.   Sleep latency was 12.0 minutes.  REM latency was - minutes.  Sleep Efficiency was 78.5%.  Wake after Sleep Onset time was 23.0 minutes.  At 12:19:29 AM the patient was placed on PAP treatment and was titrated at pressures ranging from 5 cm/H2O up to 12 cm/H20.  The total recording time of the treatment portion of the study was 297.1 minutes.  The total sleep time was 277.5 minutes.   During the treatment portion of the study, the patient spent 5.0% of total sleep time in Stage N1, 76.8% in Stage N2, 0.0% in Stages N3, and 18.2% in REM.   Sleep latency was 0.0 minutes.  REM latency was 45.0 minutes.  Sleep Efficiency was 93.4%.  Wake  after Sleep Onset time was 19.5 minutes.  Respiratory Events During the diagnostic portion of the study, the polysomnogram revealed a presence of 0 obstructive, 0 central, and 0 mixed apneas resulting in an  Apnea index of 0 events per hour.  There were 101 hypopneas (GreaterEqual to3% desaturation and/or arousal)  resulting in an Apnea\Hypopnea Index (AHI GreaterEqual to3% desaturation and/or arousal) of 47.3 events per hour.  There were 69 hypopneas (GreaterEqual to4% desaturation) resulting in an Apnea\Hypopnea Index (AHI GreaterEqual to4% desaturation) of 32.3  events per hour.  There were 4 Respiratory Effort Related Arousals resulting in a RERA index of 1.9 events per hour. The Respiratory Disturbance Index is 49.2 events per hour.  The snore index was 0 events per hour.  Mean oxygen saturation was 89.4%.   The lowest oxygen saturation during sleep was 84.0%.  Time spent LessEqual to88% oxygen saturation was  minutes ().  During the treatment portion of the study, the polysomnogram revealed a presence of 1 obstructive, 2 central, and mixed 0 apneas resulting in an Apnea index of 0.6 events per hour.  There were 43 hypopneas (GreaterEqual to3% desaturation and/or arousal)  resulting in an Apnea\Hypopnea Index (AHI GreaterEqual to3% desaturation and/or arousal) of 9.9 events per hour.  There were 32 hypopneas (GreaterEqual to4% desaturation) resulting in an Apnea\Hypopnea Index (AHI GreaterEqual to4% desaturation) of 7.6  events per hour.  There were 0 respiratory Effort Related Arousals resulting in a RERA index of 0 events per hour. The Respiratory Disturbance Index is 9.9 events per hour.  The snore index was 0 events per hour.  Mean oxygen saturation was 92.6%.  The  lowest oxygen saturation during sleep was 82.0%.  Time spent LessEqual to88% oxygen saturation was  minutes ().  Limb Activity  During the diagnostic portion of the study, there were 9 limb movements recorded.  Of this total, 0 were classified as PLMs.  Of the PLMs, 0 were associated with arousals.  The Limb Movement index was 4.2 per hour while the PLM index was - per hour.  During the treatment portion of the study, there were 42 limb  movements recorded.  Of this total, 31 were classified as PLMs.  Of the PLMs, 6 were associated with arousals.  The Limb Movement index was 9.1 per hour while the PLM index was 6.7 per hour.  Cardiac Summary During the diagnostic portion of the study, the average pulse rate was 89.0 bpm.  The minimum pulse rate was 75.0 bpm while the maximum pulse rate was 107.0 bpm.  Normal sinus rhythm with rare PVCs  During the treatment portion of the study, the average pulse rate was 75.8 bpm.  The minimum pulse rate was 57.0 bpm while the maximum pulse rate was 99.0 bpm.  Diagnosis: Severe obstructive sleep apnea Nocturnal hypoxemia  Recommendations: 1. Recommend a trial of ResMed CPAP at 12 cm H2O with heated humidity and small to medium Fisher and Paykel Evora mask. 2. Close follow-up is necessary to ensure success with CPAP therapy for maximum benefit. 3. A follow-up oximetry study on CPAP is recommended to assess the adequacy of therapy and determine the need for supplemental oxygen or the potential need for Bi-level therapy.  An arterial blood gas to determine the adequacy of baseline ventilation and  oxygenation should also be considered. 4. Healthy sleep recommendations include:  adequate nightly sleep (normal 7-9 hrs/night), avoidance of caffeine after noon and alcohol near bedtime, and maintaining a sleep environment that is cool, dark and quiet. 5. Weight loss for overweight patients is recommended.  Even modest amounts of weight loss can significantly improve the severity of sleep apnea. 6. Snoring recommendations include:  weight loss where appropriate, side sleeping, and avoidance of alcohol before bed. 7. Operation of motor vehicle should be avoided when sleepy.    This study was personally reviewed and electronically signed by: Wilbert Bihari, MD Accredited Board Certified in Sleep Medicine Date/Time: 10/22/2023 1:18PM

## 2023-10-27 ENCOUNTER — Ambulatory Visit: Payer: Self-pay | Admitting: Cardiovascular Disease

## 2023-10-27 DIAGNOSIS — R400 Somnolence: Secondary | ICD-10-CM

## 2023-10-27 DIAGNOSIS — I10 Essential (primary) hypertension: Secondary | ICD-10-CM

## 2023-10-27 DIAGNOSIS — R0683 Snoring: Secondary | ICD-10-CM

## 2023-11-04 NOTE — Telephone Encounter (Signed)
 The patient has been notified of the result and verbalized understanding.  All questions (if any) were answered. [name], CMA 11/04/2023 4:33 PM     Upon patient request DME selection is [facility] Patient understands he will be contacted by [facility] to set up his cpap. Patient understands to call if [facility] does not contact him with new setup in a timely manner. Patient understands they will be called once confirmation has been received from [facility] that they have received their new machine to schedule 10 week follow up appointment.   [facility] notified of new cpap order  Please add to airview Patient was grateful for the call and thanked me.

## 2023-11-14 ENCOUNTER — Ambulatory Visit (INDEPENDENT_AMBULATORY_CARE_PROVIDER_SITE_OTHER): Admitting: Family Medicine

## 2023-11-14 ENCOUNTER — Encounter: Payer: Self-pay | Admitting: Family Medicine

## 2023-11-14 VITALS — BP 119/81 | HR 72 | Ht 67.0 in | Wt 263.8 lb

## 2023-11-14 DIAGNOSIS — M79605 Pain in left leg: Secondary | ICD-10-CM

## 2023-11-14 DIAGNOSIS — M79604 Pain in right leg: Secondary | ICD-10-CM

## 2023-11-14 NOTE — Progress Notes (Signed)
 Established Patient Office Visit  Subjective    Patient ID: [name], female    DOB: 02-May-1970  Age: 53 y.o. MRN: [id number]  CC:  Chief Complaint  Patient presents with   Leg Pain    Pt reports numbness in toes on both feet and pain in her right leg.    HPI [name] presents for pain and swelling of right lower leg that has been persistent and worsening for the past month. Sx are worse at the end of the day and also includes numbness. Denies known trauma or injury. Patient has lesser symptoms in the left lower leg. Patient taking ibuprofen  and tylenol  for sx with minimal to no relief.   Outpatient Encounter Medications as of 11/14/2023  Medication Sig   aspirin  EC 81 MG tablet Take 1 tablet (81 mg total) by mouth daily. Swallow whole.   cetirizine (ZYRTEC) 10 MG tablet Take 10 mg by mouth daily as needed for allergies (alternating with Allegra).   fexofenadine (ALLEGRA) 60 MG tablet Take 60 mg by mouth daily as needed for allergies or rhinitis (alternating with Zyrtec).   meloxicam  (MOBIC ) 15 MG tablet Take 1 tablet (15 mg total) by mouth daily.   pantoprazole  (PROTONIX ) 40 MG tablet Take 1 tablet (40 mg total) by mouth daily.   rosuvastatin  (CRESTOR ) 40 MG tablet Take 1 tablet (40 mg total) by mouth daily.   valsartan  (DIOVAN ) 80 MG tablet Take 1 tablet (80 mg total) by mouth daily.   cyclobenzaprine  (FLEXERIL ) 10 MG tablet Take 1 tablet (10 mg total) by mouth at bedtime. (Patient not taking: Reported on 11/14/2023)   fluconazole  (DIFLUCAN ) 150 MG tablet Take 1 po on day 1 and day 4 and day 7 (Patient not taking: Reported on 10/10/2023)   gabapentin  (NEURONTIN ) 300 MG capsule Take 1 capsule (300 mg total) by mouth at bedtime. (Patient not taking: Reported on 10/10/2023)   hydrochlorothiazide  (HYDRODIURIL ) 25 MG tablet Take 1 tablet (25 mg total) by mouth daily.   liothyronine (CYTOMEL) 5 MCG tablet Take 5 mcg by mouth daily. (Patient not taking: Reported on 10/10/2023)   predniSONE   (STERAPRED UNI-PAK 21 TAB) 10 MG (21) TBPK tablet Take by mouth daily. Take 6 tabs by mouth daily  for 1 days, then 5 tabs for 1 days, then 4 tabs for 1 days, then 3 tabs for 1 days, 2 tabs for 1 days, then 1 tab by mouth daily for 1 days (Patient not taking: Reported on 11/14/2023)   tirzepatide  (MOUNJARO ) 2.5 MG/0.5ML Pen Inject 2.5 mg into the skin once a week. (Patient not taking: Reported on 10/10/2023)   No facility-administered encounter medications on file as of 11/14/2023.    Past Medical History:  Diagnosis Date   Anemia    Arthritis    CAD in native artery 05/27/2023   Depression    due to perimenopausal phase per pt.   Essential hypertension 08/30/2022   GERD (gastroesophageal reflux disease)    History of hiatal hernia    Postpartum care following cesarean delivery (4/27) 07/28/2015   Pregnancy induced hypertension    during pregnancy   Sleep apnea    no machine   Varicose veins    Vitamin D deficiency     Past Surgical History:  Procedure Laterality Date   ABDOMINAL EXPOSURE N/A 04/27/2019   Procedure: ABDOMINAL EXPOSURE;  Surgeon: Eliza Lonni RAMAN, MD;  Location: [facility] OR;  Service: Vascular;  Laterality: N/A;   ANTERIOR LUMBAR FUSION N/A 04/27/2019   Procedure:  ANTERIOR LUMBAR INTERBODY FUSION LUMBAR FOUR-FIVE WITH INSTRUMENTATION AND ALLOGRAFT;  Surgeon: Beuford Anes, MD;  Location: [facility] OR;  Service: Orthopedics;  Laterality: N/A;   CESAREAN SECTION N/A 07/28/2015   Procedure: CESAREAN SECTION;  Surgeon: Charlie Flowers, MD;  Location: [facility] ORS;  Service: Obstetrics;  Laterality: N/A;   ESOPHAGEAL MANOMETRY N/A 01/02/2017   Procedure: ESOPHAGEAL MANOMETRY (EM);  Surgeon: Saintclair Jasper, MD;  Location: [facility];  Service: Gastroenterology;  Laterality: N/A;   HERNIA REPAIR     hiatal hernia repair 03-06-17   INSERTION OF MESH N/A 03/06/2017   Procedure: INSERTION OF MESH;  Surgeon: Rubin Calamity, MD;  Location: [facility] ORS;  Service: General;  Laterality: N/A;    KNEE SURGERY Bilateral    R knee nov 2024, L knee aug 2024   TOE SURGERY Bilateral     Family History  Problem Relation Age of Onset   Heart attack Mother 53   Heart disease Mother    Diabetes Mother    Hypertension Mother    Cancer Father    Hypertension Father    Hypertension Sister    Asthma Sister    Seizures Sister    Bipolar disorder Sister    Hypertension Brother     Social History   Socioeconomic History   Marital status: Married    Spouse name: Not on file   Number of children: Not on file   Years of education: Not on file   Highest education level: 12th grade  Occupational History   Not on file  Tobacco Use   Smoking status: Former    Current packs/day: 0.00    Types: Cigarettes    Quit date: 03/06/2011    Years since quitting: 12.7   Smokeless tobacco: Never  Vaping Use   Vaping status: Never Used  Substance and Sexual Activity   Alcohol use: No    Alcohol/week: 0.0 standard drinks of alcohol   Drug use: No   Sexual activity: Yes  Other Topics Concern   Not on file  Social History Narrative   ** Merged History Encounter **       Social Drivers of Health   Financial Resource Strain: Low Risk  (11/13/2023)   Overall Financial Resource Strain (CARDIA)    Difficulty of Paying Living Expenses: Not very hard  Food Insecurity: No Food Insecurity (11/13/2023)   Hunger Vital Sign    Worried About Running Out of Food in the Last Year: Never true    Ran Out of Food in the Last Year: Never true  Transportation Needs: No Transportation Needs (11/13/2023)   PRAPARE - Administrator, Civil Service (Medical): No    Lack of Transportation (Non-Medical): No  Physical Activity: Insufficiently Active (11/13/2023)   Exercise Vital Sign    Days of Exercise per Week: 3 days    Minutes of Exercise per Session: 30 min  Stress: No Stress Concern Present (11/13/2023)   [employer] of Occupational Health - Occupational Stress Questionnaire    Feeling of  Stress: Only a little  Social Connections: Socially Integrated (11/13/2023)   Social Connection and Isolation Panel    Frequency of Communication with Friends and Family: Twice a week    Frequency of Social Gatherings with Friends and Family: Once a week    Attends Religious Services: More than 4 times per year    Active Member of [employer] or Organizations: Yes    Attends Banker Meetings: More than 4 times per year  Marital Status: Married  Catering manager Violence: Not At Risk (01/08/2023)   Humiliation, Afraid, Rape, and Kick questionnaire    Fear of Current or Ex-Partner: No    Emotionally Abused: No    Physically Abused: No    Sexually Abused: No    Review of Systems  All other systems reviewed and are negative.       Objective    BP 119/81   Pulse 72   Ht 5' 7 (1.702 m)   Wt 263 lb 12.8 oz (119.7 kg)   LMP  (LMP Unknown) Comment: 3 months ago  SpO2 97%   BMI 41.32 kg/m   Physical Exam Vitals and nursing note reviewed.  Constitutional:      General: She is not in acute distress. Cardiovascular:     Rate and Rhythm: Normal rate and regular rhythm.  Pulmonary:     Effort: Pulmonary effort is normal.     Breath sounds: Normal breath sounds.  Musculoskeletal:     Right lower leg: Swelling and tenderness present. Edema present.     Left lower leg: Tenderness present. No swelling. Edema present.  Neurological:     General: No focal deficit present.     Mental Status: She is alert and oriented to person, place, and time.         Assessment & Plan:   Bilateral leg pain -     Ambulatory referral to Vascular Surgery     Return if symptoms worsen or fail to improve.   Tanda Raguel SQUIBB, MD

## 2023-11-21 ENCOUNTER — Telehealth: Payer: Self-pay | Admitting: *Deleted

## 2023-11-21 NOTE — Telephone Encounter (Signed)
 Called patients results lmtcb.

## 2023-11-21 NOTE — Telephone Encounter (Signed)
-----   Message from Wilbert Bihari sent at 10/22/2023  1:19 PM EDT ----- Please let patient know that they have sleep apnea with successful PAP titration.  PAP ordered placed in Epic.  Followup in 6 weeks after starting PAP therapy.  Needs ONO on CPAP

## 2023-11-25 NOTE — Telephone Encounter (Signed)
 The patient has been notified of the result and verbalized understanding.  All questions (if any) were answered. [name], CMA 11/25/2023 9:06 AM.

## 2023-11-27 ENCOUNTER — Encounter: Payer: Self-pay | Admitting: Family Medicine

## 2023-11-27 NOTE — Progress Notes (Signed)
 [name] MRN: [id number] DOB: [birth year] (age: 53 y.o.)  Is in clinic today for follow up. 1 year out from TKR right. Overall doing well.  Very happy with surgery   On exam this is a well-nourished well-developed individual in no acute distress.  They are alert and oriented x3.  Mood and affect are age-appropriate.  They are cooperative with the exam.  They ambulate without difficulty or assistive device.  Head is normocephalic atraumatic.  Neck is supple without meningismus.  Cranial nerves II through XII are grossly intact without deficit.  Skin is warm dry and intact without any signs of infection.  Musculoskeletal examination of the right/left knee: ROM estimated to be -5-125 Well-healed surgical incision.  Range of motion full.  Muscle strength is 4+ out of 5 when compared bilaterally.  Knee is ligamentously stable to stress testing.  Impression/plan  1. S/P TKR (total knee replacement) using cement, right       Some concern for underlying infection/lucey on RIGHT knee. But she has no problems as of now. Recommend repeat XR in 6 months to compare lucency   X-rays reviewed by me today show status post total knee arthroplasty.  Hardware well aligned.  No obvious signs of loosening.  At this point time They will follow-up with us  on a as needed basis or every 2 years for repeat radiographs.

## 2023-12-03 ENCOUNTER — Other Ambulatory Visit: Payer: Self-pay

## 2023-12-03 DIAGNOSIS — M79606 Pain in leg, unspecified: Secondary | ICD-10-CM

## 2023-12-03 DIAGNOSIS — M79604 Pain in right leg: Secondary | ICD-10-CM

## 2023-12-16 ENCOUNTER — Encounter: Payer: Self-pay | Admitting: Emergency Medicine

## 2023-12-16 ENCOUNTER — Ambulatory Visit (INDEPENDENT_AMBULATORY_CARE_PROVIDER_SITE_OTHER): Payer: Self-pay | Admitting: Primary Care

## 2023-12-16 ENCOUNTER — Ambulatory Visit: Payer: Self-pay

## 2023-12-16 ENCOUNTER — Ambulatory Visit: Admission: EM | Admit: 2023-12-16 | Discharge: 2023-12-16 | Disposition: A | Discharge status: Home / Self Care

## 2023-12-16 DIAGNOSIS — G5703 Lesion of sciatic nerve, bilateral lower limbs: Secondary | ICD-10-CM

## 2023-12-16 MED ORDER — CYCLOBENZAPRINE HCL 10 MG PO TABS: 10.0000 mg | ORAL_TABLET | Freq: Every day | ORAL | 0 refills | Status: AC

## 2023-12-16 MED ORDER — KETOROLAC TROMETHAMINE 60 MG/2ML IM SOLN
60.0000 mg | Freq: Once | INTRAMUSCULAR | Status: AC
  Administered 2023-12-16: 60 mg via INTRAMUSCULAR

## 2023-12-16 MED ORDER — PREDNISONE 10 MG (21) PO TBPK: ORAL_TABLET | Freq: Every day | ORAL | 0 refills | Status: AC

## 2023-12-16 NOTE — Telephone Encounter (Signed)
 FYI Only or Action Required?: Action required by provider: request for appointment.  Patient was last seen in primary care on 11/14/2023 by Tanda Bleacher, MD.  Called Nurse Triage reporting Leg Pain.  Symptoms began several months ago.  Interventions attempted: Nothing.  Symptoms are: gradually worsening.  Triage Disposition: See HCP Within 4 Hours (Or PCP Triage)  Patient/caregiver understands and will follow disposition?: YesCopied from CRM #[id number]. Topic: Clinical - Red Word Triage >> Dec 16, 2023  8:39 AM Tiffany B wrote: Red Word that prompted transfer to Nurse Triage: Experiencing severe nerve pain and numbness in her right leg with a tingling sensation in her toes. Reason for Disposition  [1] SEVERE pain (e.g., excruciating, unable to do any normal activities) AND [2] not improved after 2 hours of pain medicine  Answer Assessment - Initial Assessment Questions Symptoms are worsening. Pt was referred to vascular dr. Appt is 10/16. No office appts. Pt is being seen at [facility] at 0930.      1. ONSET: When did the pain start?      months 2. LOCATION: Where is the pain located?      Right leg 3. PAIN: How bad is the pain?    (Scale 1-10; or mild, moderate, severe)     severe 4. WORK OR EXERCISE: Has there been any recent work or exercise that involved this part of the body?      na 5. CAUSE: What do you think is causing the leg pain?     Not sure 6. OTHER SYMPTOMS: Do you have any other symptoms? (e.g., chest pain, back pain, breathing difficulty, swelling, rash, fever, numbness, weakness)     Tingling and numbness in leg/toes  Protocols used: Leg Pain-A-AH

## 2023-12-16 NOTE — Telephone Encounter (Signed)
 FYI Only or Action Required?: FYI only for provider.  Patient was last seen in primary care on 11/14/2023 by Tanda Bleacher, MD.  Called Nurse Triage reporting Advice Only.   Triage Disposition: Duplicate Contact Calls  Patient/caregiver understands and will follow disposition?: Unsure       Copied from CRM #[id number]. Topic: Clinical - Red Word Triage >> Dec 16, 2023  9:38 AM Revonda D wrote: Red Word that prompted transfer to Nurse Triage: Experiencing severe nerve pain and numbness in her right leg with a tingling sensation in her toes.  UPDATE: Pt is requesting to speak with a nurse again. Pt stated that she was scheduled for an appt today at 9:30am but when she arrived they stated that the provider wasn't there. Reason for Disposition  Caller has already spoken with another triager or doctor (or NP/PA, pharmacist) AND has further questions AND triager able to answer questions.  Answer Assessment - Initial Assessment Questions 1. REASON FOR CALL: What is the main reason for your call? or How can I best help you?     Pt was scheduled AV today, but provider was not at [facility] office. Triager attempted to schedule with [facility], but no access. Triager notified pt of current wait time at [facility]-- pt will go as walk-in. 2. SYMPTOMS : Do you have any symptoms?      See alternate NT encounter - pt reports sx have not changed since last triage 3. OTHER QUESTIONS: Do you have any other questions?     N/a  Protocols used: Information Only Call - No Triage-A-AH, No Contact or Duplicate Contact Call-A-AH

## 2023-12-16 NOTE — Discharge Instructions (Addendum)
 You were seen today for pain in both legs, worse on the right side, that has been going on for about a month. 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 You are starting a prednisone  taper and your gabapentin  dose has been increased to three times a day, though you may reduce to twice daily if you feel too drowsy. Continue taking meloxicam  as prescribed and do not take other over-the-counter NSAIDs such as ibuprofen  or Aleve while on this medication. If needed, you may take Tylenol  (acetaminophen ) 1000 mg every six hours for additional pain relief. This equals two 500 mg tablets at a time. Be careful not to take more than 4000 mg of Tylenol  in a 24-hour period.  Applying ice to your buttocks for 20 minutes at a time with a towel barrier can help reduce inflammation, and gentle stretching or physical therapy exercises may improve flexibility and decrease irritation of the nerve. Follow up with an orthopedic specialist was recommended for further evaluation, and you should continue with your scheduled vascular appointment on October 16 unless your symptoms improve or resolve, in which case you may cancel. Please also follow up with your primary care provider to review your progress. Go to the emergency department right away if you develop sudden or worsening pain, new numbness or weakness, difficulty walking, loss of balance, or changes in bowel or bladder control.

## 2023-12-16 NOTE — Telephone Encounter (Signed)
 Patient has appointment on 9/30 is there anyway to double her ?

## 2023-12-16 NOTE — ED Provider Notes (Signed)
 [facility]    CSN: [id number] Arrival date & time: 12/16/23  1419      History   Chief Complaint Chief Complaint  Patient presents with   Nerve Pain     HPI [name] is a 53 y.o. female.   Discussed the use of AI scribe software for clinical note transcription with the patient, who gave verbal consent to proceed.   Patient presents with bilateral leg pain, primarily in the right leg, for one month. The pain is described as shooting and throbbing, originating from the buttocks and radiating down both legs, with the right leg being more affected.  The pain began approximately one month ago and has persisted since then. It travels from the patient's leg to the buttock area and then to the left leg. In August, the patient sought care at an urgent care facility where the pain was attributed to a back issue. Treatment included a steroid injection and prednisone , which provided temporary relief.  Last month, the patient's primary care physician examined the leg, noting pain upon palpation, and subsequently referred the patient to a vascular specialist. The patient reports no varicose veins or discoloration in the lower extremities. A previous knee replacement was over a year ago. Patient denies any issues with her knee.   The patient is currently taking meloxicam  daily and gabapentin  300 mg at bedtime. Previous use of Flexeril  was helpful.   The following sections of the patient's history were reviewed and updated as appropriate: allergies, current medications, past family history, past medical history, past social history, past surgical history, and problem list.       Past Medical History:  Diagnosis Date   Anemia    Arthritis    CAD in native artery 05/27/2023   Depression    due to perimenopausal phase per pt.   Essential hypertension 08/30/2022   GERD (gastroesophageal reflux disease)    History of hiatal hernia    Postpartum care following cesarean  delivery (4/27) 07/28/2015   Pregnancy induced hypertension    during pregnancy   Sleep apnea    no machine   Varicose veins    Vitamin D deficiency     Patient Active Problem List   Diagnosis Date Noted   Obesity 07/08/2023   CAD in native artery 05/27/2023   Essential hypertension 08/30/2022   Radiculopathy 04/27/2019   S/P Nissen fundoplication (without gastrostomy tube) procedure 03/06/2017   Preeclampsia 07/28/2015   Postpartum care following cesarean delivery (4/27) 07/28/2015   No significant past medical history    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   HYPOKALEMIA 09/24/2008   TOBACCO ABUSE 09/24/2008   PHLEBITIS&THROMBOPHLEB SUP VESSELS LOWER EXTREM 03/03/2007    Past Surgical History:  Procedure Laterality Date   ABDOMINAL EXPOSURE N/A 04/27/2019   Procedure: ABDOMINAL EXPOSURE;  Surgeon: Eliza Lonni RAMAN, MD;  Location: [facility] OR;  Service: Vascular;  Laterality: N/A;   ANTERIOR LUMBAR FUSION N/A 04/27/2019   Procedure: ANTERIOR LUMBAR INTERBODY FUSION LUMBAR FOUR-FIVE WITH INSTRUMENTATION AND ALLOGRAFT;  Surgeon: Beuford Anes, MD;  Location: [facility] OR;  Service: Orthopedics;  Laterality: N/A;   CESAREAN SECTION N/A 07/28/2015   Procedure: CESAREAN SECTION;  Surgeon: Charlie Flowers, MD;  Location: [facility] ORS;  Service: Obstetrics;  Laterality: N/A;   ESOPHAGEAL MANOMETRY N/A 01/02/2017   Procedure: ESOPHAGEAL MANOMETRY (EM);  Surgeon: Saintclair Jasper, MD;  Location: [facility];  Service: Gastroenterology;  Laterality: N/A;   HERNIA REPAIR     hiatal hernia repair 03-06-17   INSERTION OF  MESH N/A 03/06/2017   Procedure: INSERTION OF MESH;  Surgeon: Rubin Calamity, MD;  Location: [facility] ORS;  Service: General;  Laterality: N/A;   KNEE SURGERY Bilateral    R knee nov 2024, L knee aug 2024   TOE SURGERY Bilateral     OB History     Gravida  1   Para  1   Term  1   Preterm  0   AB  0   Living  1      SAB  0   IAB  0   Ectopic  0   Multiple  0    Live Births  1            Home Medications    Prior to Admission medications   Medication Sig Start Date End Date Taking? Authorizing Provider  cyclobenzaprine  (FLEXERIL ) 10 MG tablet Take 1 tablet (10 mg total) by mouth at bedtime. 12/16/23  Yes Iola Lukes, FNP  linaclotide [facility]) 290 MCG CAPS capsule take orally 1 PO 30 minutes before the first meal of the day; Duration: 90 days 08/28/22  Yes [provider]  predniSONE  (STERAPRED UNI-PAK 21 TAB) 10 MG (21) TBPK tablet Take by mouth daily. Take 6 tabs by mouth daily  for 2 days, then 5 tabs for 2 days, then 4 tabs for 2 days, then 3 tabs for 2 days, 2 tabs for 2 days, then 1 tab by mouth daily for 2 days 12/16/23  Yes Iola Lukes, FNP  aspirin  EC 81 MG tablet Take 1 tablet (81 mg total) by mouth daily. Swallow whole. 09/27/22   Raford Riggs, MD  cetirizine (ZYRTEC) 10 MG tablet Take 10 mg by mouth daily as needed for allergies (alternating with Allegra).    [provider]  fexofenadine (ALLEGRA) 60 MG tablet Take 60 mg by mouth daily as needed for allergies or rhinitis (alternating with Zyrtec).    [provider]  hydrochlorothiazide  (HYDRODIURIL ) 25 MG tablet Take 1 tablet (25 mg total) by mouth daily. 06/25/23   Tanda Bleacher, MD  meloxicam  (MOBIC ) 15 MG tablet Take 1 tablet (15 mg total) by mouth daily. 06/25/23   Tanda Bleacher, MD  pantoprazole  (PROTONIX ) 40 MG tablet Take 1 tablet (40 mg total) by mouth daily. 06/25/23   Tanda Bleacher, MD  rosuvastatin  (CRESTOR ) 40 MG tablet Take 1 tablet (40 mg total) by mouth daily. 06/05/23 11/14/23  Raford Riggs, MD  valsartan  (DIOVAN ) 80 MG tablet Take 1 tablet (80 mg total) by mouth daily. 06/05/23   Raford Riggs, MD    Family History Family History  Problem Relation Age of Onset   Heart attack Mother 62   Heart disease Mother    Diabetes Mother    Hypertension Mother    Cancer Father    Hypertension Father    Hypertension Sister     Asthma Sister    Seizures Sister    Bipolar disorder Sister    Hypertension Brother     Social History Social History   Tobacco Use   Smoking status: Former    Current packs/day: 0.00    Types: Cigarettes    Quit date: 03/06/2011    Years since quitting: 12.7    Passive exposure: Past   Smokeless tobacco: Never  Vaping Use   Vaping status: Never Used  Substance Use Topics   Alcohol use: No    Alcohol/week: 0.0 standard drinks of alcohol   Drug use: No     Allergies  Aleve [naproxen], Excedrin tension headache [acetaminophen -caffeine], Ibuprofen , and Tramadol   Review of Systems Review of Systems  Musculoskeletal:  Positive for arthralgias. Negative for back pain and gait problem.  Neurological:  Negative for weakness and numbness.  All other systems reviewed and are negative.    Physical Exam Triage Vital Signs ED Triage Vitals  Encounter Vitals Group     BP 12/16/23 1456 132/88     Girls Systolic BP Percentile --      Girls Diastolic BP Percentile --      Boys Systolic BP Percentile --      Boys Diastolic BP Percentile --      Pulse Rate 12/16/23 1456 80     Resp 12/16/23 1456 18     Temp 12/16/23 1456 98.1 F (36.7 C)     Temp Source 12/16/23 1456 Oral     SpO2 12/16/23 1456 97 %     Weight 12/16/23 1456 263 lb 14.3 oz (119.7 kg)     Height --      Head Circumference --      Peak Flow --      Pain Score 12/16/23 1454 10     Pain Loc --      Pain Education --      Exclude from Growth Chart --    No data found.  Updated Vital Signs BP 132/88 (BP Location: Left Arm)   Pulse 80   Temp 98.1 F (36.7 C) (Oral)   Resp 18   Wt 263 lb 14.3 oz (119.7 kg)   LMP  (LMP Unknown) Comment: 3 months ago  SpO2 97%   BMI 41.33 kg/m   Visual Acuity Right Eye Distance:   Left Eye Distance:   Bilateral Distance:    Right Eye Near:   Left Eye Near:    Bilateral Near:     Physical Exam Vitals reviewed.  Constitutional:      General: She is awake. She  is not in acute distress.    Appearance: Normal appearance. She is well-developed. She is obese. She is not ill-appearing, toxic-appearing or diaphoretic.  HENT:     Head: Normocephalic.     Mouth/Throat:     Mouth: Mucous membranes are moist.  Cardiovascular:     Rate and Rhythm: Normal rate and regular rhythm.  Pulmonary:     Effort: Pulmonary effort is normal.     Breath sounds: Normal breath sounds.  Abdominal:     Palpations: Abdomen is soft.     Tenderness: There is no right CVA tenderness or left CVA tenderness.  Musculoskeletal:        General: Normal range of motion.     Cervical back: Normal, normal range of motion and neck supple.     Thoracic back: Normal.     Lumbar back: No swelling, deformity, lacerations, spasms or tenderness. Normal range of motion. Negative right straight leg raise test and negative left straight leg raise test.       Legs:     Comments: Pain in both buttocks, more pronounced on the right, with radiation down the posterior lower extremities. Strength and sensation are intact bilaterally. Range of motion is limited secondary to pain. Neurovascular status is intact.  Skin:    General: Skin is warm and dry.  Neurological:     General: No focal deficit present.     Mental Status: She is alert and oriented to person, place, and time.     Cranial Nerves: Cranial nerves 2-12  are intact.     Sensory: Sensation is intact.     Motor: Motor function is intact. No weakness.     Coordination: Coordination is intact.     Gait: Gait is intact.  Psychiatric:        Mood and Affect: Mood normal.        Speech: Speech normal.        Behavior: Behavior normal. Behavior is cooperative.      UC Treatments / Results  Labs (all labs ordered are listed, but only abnormal results are displayed) Labs Reviewed - No data to display  EKG   Radiology No results found.  Procedures Procedures (including critical care time)  Medications Ordered in  UC Medications  ketorolac  (TORADOL ) injection 60 mg (60 mg Intramuscular Given 12/16/23 1753)    Initial Impression / Assessment and Plan / UC Course  I have reviewed the triage vital signs and the nursing notes.  Pertinent labs & imaging results that were available during my care of the patient were reviewed by me and considered in my medical decision making (see chart for details).     The patient was evaluated for bilateral leg pain present for one month, more pronounced on the right, described as shooting and throbbing from the buttocks radiating down the legs. She previously had temporary relief with a steroid injection and prednisone  in August, and her primary care visit last month reproduced pain with leg palpation, prompting a vascular referral. Current symptoms and exam are most consistent with piriformis syndrome, where the piriformis muscle compresses the sciatic nerve, rather than a vascular cause, as there are no varicose veins, discoloration, or other vascular findings. A prednisone  taper was initiated, and gabapentin  was increased to 300 mg three times daily, with the option to reduce to twice daily if drowsiness occurs. She was advised to continue meloxicam  as prescribed, avoid additional over-the-counter NSAIDs, and may use Tylenol  for added pain relief. Supportive measures including ice application to the buttocks for 20 minutes at a time and referral to physical therapy for stretching and strengthening were discussed. Referral to orthopedics for further evaluation was recommended, and she should keep her scheduled vascular appointment on October 16 unless symptoms significantly improve or resolve, in which case cancellation may be reasonable. Emergency precautions were reviewed, including return for worsening pain, weakness, numbness, or bowel and bladder changes.  Today's evaluation has revealed no signs of a dangerous process. Discussed diagnosis with patient and/or guardian.  Patient and/or guardian aware of their diagnosis, possible red flag symptoms to watch out for and need for close follow up. Patient and/or guardian understands verbal and written discharge instructions. Patient and/or guardian comfortable with plan and disposition.  Patient and/or guardian has a clear mental status at this time, good insight into illness (after discussion and teaching) and has clear judgment to make decisions regarding their care  Documentation was completed with the aid of voice recognition software. Transcription may contain typographical errors.  Final Clinical Impressions(s) / UC Diagnoses   Final diagnoses:  Piriformis syndrome of both sides     Discharge Instructions      You were seen today for pain in both legs, worse on the right side, that has been going on for about a month. 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 You are starting a prednisone  taper and your gabapentin  dose has been  increased to three times a day, though you may reduce to twice daily if you feel too drowsy. Continue taking meloxicam  as prescribed and do not take other over-the-counter NSAIDs such as ibuprofen  or Aleve while on this medication. If needed, you may take Tylenol  (acetaminophen ) 1000 mg every six hours for additional pain relief. This equals two 500 mg tablets at a time. Be careful not to take more than 4000 mg of Tylenol  in a 24-hour period.  Applying ice to your buttocks for 20 minutes at a time with a towel barrier can help reduce inflammation, and gentle stretching or physical therapy exercises may improve flexibility and decrease irritation of the nerve. Follow up with an orthopedic specialist was recommended for further evaluation, and you should continue with your scheduled vascular appointment on October 16 unless your symptoms improve or resolve, in  which case you may cancel. Please also follow up with your primary care provider to review your progress. Go to the emergency department right away if you develop sudden or worsening pain, new numbness or weakness, difficulty walking, loss of balance, or changes in bowel or bladder control.      ED Prescriptions     Medication Sig Dispense Auth. Provider   predniSONE  (STERAPRED UNI-PAK 21 TAB) 10 MG (21) TBPK tablet Take by mouth daily. Take 6 tabs by mouth daily  for 2 days, then 5 tabs for 2 days, then 4 tabs for 2 days, then 3 tabs for 2 days, 2 tabs for 2 days, then 1 tab by mouth daily for 2 days 42 tablet Aahna Rossa, [place], FNP   cyclobenzaprine  (FLEXERIL ) 10 MG tablet Take 1 tablet (10 mg total) by mouth at bedtime. 10 tablet Iola Lukes, FNP      PDMP not reviewed this encounter.   Iola Lukes, [place] 12/16/23 [phone]

## 2023-12-16 NOTE — ED Triage Notes (Signed)
 Pt presents c/o leg and nerve pain in right leg  x 7 days. Pt reports she has an appt with vascular doctor on 01/16/24. Pt states the pain woke her at 2 am and her toes on the right foot are numb.

## 2023-12-16 NOTE — Telephone Encounter (Signed)
 Pt went to UC.

## 2023-12-31 ENCOUNTER — Ambulatory Visit (INDEPENDENT_AMBULATORY_CARE_PROVIDER_SITE_OTHER): Admitting: Family Medicine

## 2023-12-31 ENCOUNTER — Encounter: Payer: Self-pay | Admitting: Family Medicine

## 2023-12-31 VITALS — BP 124/86 | HR 84 | Ht 67.0 in | Wt 268.6 lb

## 2023-12-31 DIAGNOSIS — K219 Gastro-esophageal reflux disease without esophagitis: Secondary | ICD-10-CM

## 2023-12-31 DIAGNOSIS — M79605 Pain in left leg: Secondary | ICD-10-CM

## 2023-12-31 DIAGNOSIS — E66813 Obesity, class 3: Secondary | ICD-10-CM

## 2023-12-31 DIAGNOSIS — I10 Essential (primary) hypertension: Secondary | ICD-10-CM | POA: Diagnosis not present

## 2023-12-31 DIAGNOSIS — E785 Hyperlipidemia, unspecified: Secondary | ICD-10-CM | POA: Diagnosis not present

## 2023-12-31 DIAGNOSIS — M79604 Pain in right leg: Secondary | ICD-10-CM

## 2023-12-31 DIAGNOSIS — Z6841 Body Mass Index (BMI) 40.0 and over, adult: Secondary | ICD-10-CM

## 2023-12-31 MED ORDER — PANTOPRAZOLE SODIUM 40 MG PO TBEC: 40.0000 mg | DELAYED_RELEASE_TABLET | Freq: Every day | ORAL | 3 refills | Status: AC

## 2023-12-31 MED ORDER — MELOXICAM 15 MG PO TABS: 15.0000 mg | ORAL_TABLET | Freq: Every day | ORAL | 1 refills | Status: DC

## 2023-12-31 MED ORDER — HYDROCHLOROTHIAZIDE 25 MG PO TABS: 25.0000 mg | ORAL_TABLET | Freq: Every day | ORAL | 3 refills | Status: AC

## 2023-12-31 MED ORDER — DULOXETINE HCL 20 MG PO CPEP: 20.0000 mg | ORAL_CAPSULE | Freq: Every day | ORAL | 1 refills | Status: DC

## 2023-12-31 NOTE — Progress Notes (Signed)
 Established Patient Office Visit  Subjective    Patient ID: [name], female    DOB: 11/05/1970  Age: 53 y.o. MRN: [id number]  CC:  Chief Complaint  Patient presents with   Medical Management of Chronic Issues    Pt reports pain in her legs and wanting some medicine to help    HPI [name] presents for bilateral leg pain. Patient reports her appt has been changed from October 2 to the 16th. . She reports persistent pain.   Outpatient Encounter Medications as of 12/31/2023  Medication Sig   aspirin  EC 81 MG tablet Take 1 tablet (81 mg total) by mouth daily. Swallow whole.   cetirizine (ZYRTEC) 10 MG tablet Take 10 mg by mouth daily as needed for allergies (alternating with Allegra).   DULoxetine (CYMBALTA) 20 MG capsule Take 1 capsule (20 mg total) by mouth daily.   fexofenadine (ALLEGRA) 60 MG tablet Take 60 mg by mouth daily as needed for allergies or rhinitis (alternating with Zyrtec).   rosuvastatin  (CRESTOR ) 40 MG tablet Take 1 tablet (40 mg total) by mouth daily.   valsartan  (DIOVAN ) 80 MG tablet Take 1 tablet (80 mg total) by mouth daily.   [DISCONTINUED] hydrochlorothiazide  (HYDRODIURIL ) 25 MG tablet Take 1 tablet (25 mg total) by mouth daily.   [DISCONTINUED] meloxicam  (MOBIC ) 15 MG tablet Take 1 tablet (15 mg total) by mouth daily.   [DISCONTINUED] pantoprazole  (PROTONIX ) 40 MG tablet Take 1 tablet (40 mg total) by mouth daily.   cyclobenzaprine  (FLEXERIL ) 10 MG tablet Take 1 tablet (10 mg total) by mouth at bedtime. (Patient not taking: Reported on 12/31/2023)   hydrochlorothiazide  (HYDRODIURIL ) 25 MG tablet Take 1 tablet (25 mg total) by mouth daily.   linaclotide (LINZESS) 290 MCG CAPS capsule take orally 1 PO 30 minutes before the first meal of the day; Duration: 90 days (Patient not taking: Reported on 12/31/2023)   meloxicam  (MOBIC ) 15 MG tablet Take 1 tablet (15 mg total) by mouth daily.   pantoprazole  (PROTONIX ) 40 MG tablet Take 1 tablet (40 mg total) by mouth  daily.   predniSONE  (STERAPRED UNI-PAK 21 TAB) 10 MG (21) TBPK tablet Take by mouth daily. Take 6 tabs by mouth daily  for 2 days, then 5 tabs for 2 days, then 4 tabs for 2 days, then 3 tabs for 2 days, 2 tabs for 2 days, then 1 tab by mouth daily for 2 days (Patient not taking: Reported on 12/31/2023)   No facility-administered encounter medications on file as of 12/31/2023.    Past Medical History:  Diagnosis Date   Anemia    Arthritis    CAD in native artery 05/27/2023   Depression    due to perimenopausal phase per pt.   Essential hypertension 08/30/2022   GERD (gastroesophageal reflux disease)    History of hiatal hernia    Postpartum care following cesarean delivery (4/27) 07/28/2015   Pregnancy induced hypertension    during pregnancy   Sleep apnea    no machine   Varicose veins    Vitamin D deficiency     Past Surgical History:  Procedure Laterality Date   ABDOMINAL EXPOSURE N/A 04/27/2019   Procedure: ABDOMINAL EXPOSURE;  Surgeon: Eliza Lonni RAMAN, MD;  Location: [facility] OR;  Service: Vascular;  Laterality: N/A;   ANTERIOR LUMBAR FUSION N/A 04/27/2019   Procedure: ANTERIOR LUMBAR INTERBODY FUSION LUMBAR FOUR-FIVE WITH INSTRUMENTATION AND ALLOGRAFT;  Surgeon: Beuford Anes, MD;  Location: [facility] OR;  Service: Orthopedics;  Laterality: N/A;  CESAREAN SECTION N/A 07/28/2015   Procedure: CESAREAN SECTION;  Surgeon: Charlie Flowers, MD;  Location: [facility];  Service: Obstetrics;  Laterality: N/A;   ESOPHAGEAL MANOMETRY N/A 01/02/2017   Procedure: ESOPHAGEAL MANOMETRY (EM);  Surgeon: Saintclair Jasper, MD;  Location: [facility];  Service: Gastroenterology;  Laterality: N/A;   HERNIA REPAIR     hiatal hernia repair 03-06-17   INSERTION OF MESH N/A 03/06/2017   Procedure: INSERTION OF MESH;  Surgeon: Rubin Calamity, MD;  Location: [facility];  Service: General;  Laterality: N/A;   KNEE SURGERY Bilateral    R knee nov 2024, L knee aug 2024   TOE SURGERY Bilateral     Family History   Problem Relation Age of Onset   Heart attack Mother 59   Heart disease Mother    Diabetes Mother    Hypertension Mother    Cancer Father    Hypertension Father    Hypertension Sister    Asthma Sister    Seizures Sister    Bipolar disorder Sister    Hypertension Brother     Social History   Socioeconomic History   Marital status: Married    Spouse name: Not on file   Number of children: Not on file   Years of education: Not on file   Highest education level: 12th grade  Occupational History   Not on file  Tobacco Use   Smoking status: Former    Current packs/day: 0.00    Types: Cigarettes    Quit date: 03/06/2011    Years since quitting: 12.8    Passive exposure: Past   Smokeless tobacco: Never  Vaping Use   Vaping status: Never Used  Substance and Sexual Activity   Alcohol use: No    Alcohol/week: 0.0 standard drinks of alcohol   Drug use: No   Sexual activity: Yes  Other Topics Concern   Not on file  Social History Narrative   ** Merged History Encounter **       Social Drivers of Health   Financial Resource Strain: Low Risk  (11/13/2023)   Overall Financial Resource Strain (CARDIA)    Difficulty of Paying Living Expenses: Not very hard  Food Insecurity: No Food Insecurity (11/13/2023)   Hunger Vital Sign    Worried About Running Out of Food in the Last Year: Never true    Ran Out of Food in the Last Year: Never true  Transportation Needs: No Transportation Needs (11/13/2023)   PRAPARE - Administrator, Civil Service (Medical): No    Lack of Transportation (Non-Medical): No  Physical Activity: Insufficiently Active (11/13/2023)   Exercise Vital Sign    Days of Exercise per Week: 3 days    Minutes of Exercise per Session: 30 min  Stress: No Stress Concern Present (11/13/2023)   [employer] of Occupational Health - Occupational Stress Questionnaire    Feeling of Stress: Only a little  Social Connections: Socially Integrated (11/13/2023)    Social Connection and Isolation Panel    Frequency of Communication with Friends and Family: Twice a week    Frequency of Social Gatherings with Friends and Family: Once a week    Attends Religious Services: More than 4 times per year    Active Member of [employer] or Organizations: Yes    Attends Banker Meetings: More than 4 times per year    Marital Status: Married  Catering manager Violence: Not At Risk (01/08/2023)   Humiliation, Afraid, Rape, and Kick questionnaire  Fear of Current or Ex-Partner: No    Emotionally Abused: No    Physically Abused: No    Sexually Abused: No    Review of Systems  All other systems reviewed and are negative.       Objective    BP 124/86   Pulse 84   Ht 5' 7 (1.702 m)   Wt 268 lb 9.6 oz (121.8 kg)   LMP  (LMP Unknown) Comment: 3 months ago  SpO2 98%   BMI 42.07 kg/m   Physical Exam Vitals and nursing note reviewed.  Constitutional:      General: She is not in acute distress.    Appearance: She is obese.  Cardiovascular:     Rate and Rhythm: Normal rate and regular rhythm.  Pulmonary:     Effort: Pulmonary effort is normal.     Breath sounds: Normal breath sounds.  Abdominal:     Palpations: Abdomen is soft.     Tenderness: There is no abdominal tenderness.  Neurological:     General: No focal deficit present.     Mental Status: She is alert and oriented to person, place, and time.         Assessment & Plan:   Essential hypertension  Hyperlipidemia, unspecified hyperlipidemia type  Gastroesophageal reflux disease without esophagitis -     Pantoprazole  Sodium; Take 1 tablet (40 mg total) by mouth daily.  Dispense: 90 tablet; Refill: 3  Bilateral leg pain  Class 3 severe obesity due to excess calories with serious comorbidity and body mass index (BMI) of 40.0 to 44.9 in adult  Other orders -     hydroCHLOROthiazide ; Take 1 tablet (25 mg total) by mouth daily.  Dispense: 90 tablet; Refill: 3 -      Meloxicam ; Take 1 tablet (15 mg total) by mouth daily.  Dispense: 90 tablet; Refill: 1 -     DULoxetine HCl; Take 1 capsule (20 mg total) by mouth daily.  Dispense: 30 capsule; Refill: 1   Patient would like to consider weight loss medication  No follow-ups on file.   Tanda Raguel SQUIBB, MD

## 2024-01-02 ENCOUNTER — Encounter (HOSPITAL_COMMUNITY)

## 2024-01-02 ENCOUNTER — Encounter: Admitting: Vascular Surgery

## 2024-01-10 ENCOUNTER — Ambulatory Visit (HOSPITAL_BASED_OUTPATIENT_CLINIC_OR_DEPARTMENT_OTHER): Admitting: Orthopaedic Surgery

## 2024-01-10 ENCOUNTER — Encounter (HOSPITAL_BASED_OUTPATIENT_CLINIC_OR_DEPARTMENT_OTHER): Payer: Self-pay

## 2024-01-10 ENCOUNTER — Ambulatory Visit (INDEPENDENT_AMBULATORY_CARE_PROVIDER_SITE_OTHER)

## 2024-01-10 DIAGNOSIS — M545 Low back pain, unspecified: Secondary | ICD-10-CM

## 2024-01-10 NOTE — Progress Notes (Signed)
 Chief Complaint: Right worse than left leg pain     History of Present Illness:    [name] is a 53 y.o. female presents with ongoing lower back pain that does radiate to the right worse than the left.  She is status post a L4-L5 fusion in 2021 with Dr. Beuford.  She did do extremely well with resolution of her paresthesias although unfortunately she has had a recurrence of radiating pain and numbness down the right leg as well as the left leg to a lesser extent.  She has been on Cymbalta for this.  She has trialed the prednisone  with some limited relief although her numbness is progressing now prickly on the right side    PMH/PSH/Family History/Social History/Meds/Allergies:    Past Medical History:  Diagnosis Date   Anemia    Arthritis    CAD in native artery 05/27/2023   Depression    due to perimenopausal phase per pt.   Essential hypertension 08/30/2022   GERD (gastroesophageal reflux disease)    History of hiatal hernia    Postpartum care following cesarean delivery (4/27) 07/28/2015   Pregnancy induced hypertension    during pregnancy   Sleep apnea    no machine   Varicose veins    Vitamin D deficiency    Past Surgical History:  Procedure Laterality Date   ABDOMINAL EXPOSURE N/A 04/27/2019   Procedure: ABDOMINAL EXPOSURE;  Surgeon: Eliza Lonni RAMAN, MD;  Location: [facility] OR;  Service: Vascular;  Laterality: N/A;   ANTERIOR LUMBAR FUSION N/A 04/27/2019   Procedure: ANTERIOR LUMBAR INTERBODY FUSION LUMBAR FOUR-FIVE WITH INSTRUMENTATION AND ALLOGRAFT;  Surgeon: Beuford Anes, MD;  Location: [facility] OR;  Service: Orthopedics;  Laterality: N/A;   CESAREAN SECTION N/A 07/28/2015   Procedure: CESAREAN SECTION;  Surgeon: Charlie Flowers, MD;  Location: [facility] ORS;  Service: Obstetrics;  Laterality: N/A;   ESOPHAGEAL MANOMETRY N/A 01/02/2017   Procedure: ESOPHAGEAL MANOMETRY (EM);  Surgeon: Saintclair Jasper, MD;  Location: [facility];  Service: Gastroenterology;  Laterality:  N/A;   HERNIA REPAIR     hiatal hernia repair 03-06-17   INSERTION OF MESH N/A 03/06/2017   Procedure: INSERTION OF MESH;  Surgeon: Rubin Calamity, MD;  Location: [facility] ORS;  Service: General;  Laterality: N/A;   KNEE SURGERY Bilateral    R knee nov 2024, L knee aug 2024   TOE SURGERY Bilateral    Social History   Socioeconomic History   Marital status: Married    Spouse name: Not on file   Number of children: Not on file   Years of education: Not on file   Highest education level: 12th grade  Occupational History   Not on file  Tobacco Use   Smoking status: Former    Current packs/day: 0.00    Types: Cigarettes    Quit date: 03/06/2011    Years since quitting: 12.8    Passive exposure: Past   Smokeless tobacco: Never  Vaping Use   Vaping status: Never Used  Substance and Sexual Activity   Alcohol use: No    Alcohol/week: 0.0 standard drinks of alcohol   Drug use: No   Sexual activity: Yes  Other Topics Concern   Not on file  Social History Narrative   ** Merged History Encounter **       Social Drivers of Health   Financial Resource Strain: Low Risk  (11/13/2023)   Overall Financial Resource Strain (CARDIA)    Difficulty of Paying Living Expenses: Not very hard  Food Insecurity: No Food Insecurity (11/13/2023)   Hunger Vital Sign    Worried About Running Out of Food in the Last Year: Never true    Ran Out of Food in the Last Year: Never true  Transportation Needs: No Transportation Needs (11/13/2023)   PRAPARE - Administrator, Civil Service (Medical): No    Lack of Transportation (Non-Medical): No  Physical Activity: Insufficiently Active (11/13/2023)   Exercise Vital Sign    Days of Exercise per Week: 3 days    Minutes of Exercise per Session: 30 min  Stress: No Stress Concern Present (11/13/2023)   [employer] of Occupational Health - Occupational Stress Questionnaire    Feeling of Stress: Only a little  Social Connections: Socially Integrated  (11/13/2023)   Social Connection and Isolation Panel    Frequency of Communication with Friends and Family: Twice a week    Frequency of Social Gatherings with Friends and Family: Once a week    Attends Religious Services: More than 4 times per year    Active Member of [employer] or Organizations: Yes    Attends Engineer, structural: More than 4 times per year    Marital Status: Married   Family History  Problem Relation Age of Onset   Heart attack Mother 36   Heart disease Mother    Diabetes Mother    Hypertension Mother    Cancer Father    Hypertension Father    Hypertension Sister    Asthma Sister    Seizures Sister    Bipolar disorder Sister    Hypertension Brother    Allergies  Allergen Reactions   Aleve [Naproxen] Shortness Of Breath   Excedrin Tension Headache [Acetaminophen -Caffeine] Shortness Of Breath   Ibuprofen  Anaphylaxis   Tramadol Nausea And Vomiting and Swelling   Current Outpatient Medications  Medication Sig Dispense Refill   aspirin  EC 81 MG tablet Take 1 tablet (81 mg total) by mouth daily. Swallow whole. 90 tablet 3   cetirizine (ZYRTEC) 10 MG tablet Take 10 mg by mouth daily as needed for allergies (alternating with Allegra).     cyclobenzaprine  (FLEXERIL ) 10 MG tablet Take 1 tablet (10 mg total) by mouth at bedtime. (Patient not taking: Reported on 12/31/2023) 10 tablet 0   DULoxetine (CYMBALTA) 20 MG capsule Take 1 capsule (20 mg total) by mouth daily. 30 capsule 1   fexofenadine (ALLEGRA) 60 MG tablet Take 60 mg by mouth daily as needed for allergies or rhinitis (alternating with Zyrtec).     hydrochlorothiazide  (HYDRODIURIL ) 25 MG tablet Take 1 tablet (25 mg total) by mouth daily. 90 tablet 3   linaclotide (LINZESS) 290 MCG CAPS capsule take orally 1 PO 30 minutes before the first meal of the day; Duration: 90 days (Patient not taking: Reported on 12/31/2023)     meloxicam  (MOBIC ) 15 MG tablet Take 1 tablet (15 mg total) by mouth daily. 90 tablet 1    pantoprazole  (PROTONIX ) 40 MG tablet Take 1 tablet (40 mg total) by mouth daily. 90 tablet 3   predniSONE  (STERAPRED UNI-PAK 21 TAB) 10 MG (21) TBPK tablet Take by mouth daily. Take 6 tabs by mouth daily  for 2 days, then 5 tabs for 2 days, then 4 tabs for 2 days, then 3 tabs for 2 days, 2 tabs for 2 days, then 1 tab by mouth daily for 2 days (Patient not taking: Reported on 12/31/2023) 42 tablet 0   rosuvastatin  (CRESTOR ) 40 MG tablet Take 1 tablet (40  mg total) by mouth daily. 90 tablet 3   valsartan  (DIOVAN ) 80 MG tablet Take 1 tablet (80 mg total) by mouth daily. 90 tablet 3   No current facility-administered medications for this visit.   No results found.  Review of Systems:   A ROS was performed including pertinent positives and negatives as documented in the HPI.  Physical Exam :   Constitutional: NAD and appears stated age Neurological: Alert and oriented Psych: Appropriate affect and cooperative There were no vitals taken for this visit.   Comprehensive Musculoskeletal Exam:    Positive straight leg raise on the right with radiating pain.  There is pain about the back which does radiate.  This is worse with forward flexion of the trunk.  Walks with a mildly antalgic gait favoring the right side.  No other weakness involving the muscle groups of the lower extremities   Imaging:   Xray (4 views lumbar spine): Status post L4-L5 fusion without evidence of complication there is adjacent segment disease above and below     I personally reviewed and interpreted the radiographs.   Assessment and Plan:   53 y.o. 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  At this time she has had had a recurrence of her symptoms and as a result I would recommend an MRI to assess for any type of additional nerve impingement.  Will plan to see her back following discuss results  -Plan for MRI and follow-up to discuss results   I personally saw and  evaluated the patient, and participated in the management and treatment plan.  Elspeth Parker, MD Attending Physician, Orthopedic Surgery  This document was dictated using Dragon voice recognition software. A reasonable attempt at proof reading has been made to minimize errors.

## 2024-01-16 ENCOUNTER — Encounter (HOSPITAL_COMMUNITY)

## 2024-01-16 ENCOUNTER — Encounter: Admitting: Vascular Surgery

## 2024-01-24 ENCOUNTER — Encounter (HOSPITAL_BASED_OUTPATIENT_CLINIC_OR_DEPARTMENT_OTHER): Payer: Self-pay | Admitting: Orthopaedic Surgery

## 2024-01-28 ENCOUNTER — Ambulatory Visit
Admission: RE | Admit: 2024-01-28 | Discharge: 2024-01-28 | Disposition: A | Discharge status: Home / Self Care | Source: Ambulatory Visit | Attending: Orthopaedic Surgery | Admitting: Orthopaedic Surgery

## 2024-01-28 DIAGNOSIS — M545 Low back pain, unspecified: Secondary | ICD-10-CM

## 2024-02-03 ENCOUNTER — Encounter: Payer: Self-pay | Admitting: Radiology

## 2024-02-11 ENCOUNTER — Telehealth: Admitting: Family Medicine

## 2024-02-11 DIAGNOSIS — F411 Generalized anxiety disorder: Secondary | ICD-10-CM

## 2024-02-11 DIAGNOSIS — Z713 Dietary counseling and surveillance: Secondary | ICD-10-CM

## 2024-02-11 DIAGNOSIS — Z7689 Persons encountering health services in other specified circumstances: Secondary | ICD-10-CM

## 2024-02-11 MED ORDER — DULOXETINE HCL 30 MG PO CPEP: 30.0000 mg | ORAL_CAPSULE | Freq: Every day | ORAL | 1 refills | Status: DC

## 2024-02-11 NOTE — Telephone Encounter (Signed)
 Reached out to patient and spoke to her concerning cpap set up after receiving a fax from [facility]  for a void order notification because they have not been able to successfully schedule services with the patient despite multiple attempts.The patient states she will call the dme to make the appointment. 8/5 called lmtcb,8/19 sched for 8/22, 8/21 pt cx, 8/26 called to sched , lmtcb-9/20 called to sched lmtcb, ,9/25 called to sched lmtcb, 9/28 called to schedm lmtcb mailed letter to pt.-10/3 sched for 10/21, 10/21 no showwfit appt.10/31 called to resched lmtcb.

## 2024-02-11 NOTE — Progress Notes (Signed)
 Virtual Visit via Video Note  I connected with [name] on 02/11/24 at  1:40 PM EST by a video enabled telemedicine application and verified that I am speaking with the correct person using two identifiers.  Location: Patient: [place] Provider: [place]   I discussed the limitations of evaluation and management by telemedicine and the availability of in person appointments. The patient expressed understanding and agreed to proceed.  History of Present Illness:   Patient for follow up of cymbalta . She reports that she believes she would like to try an increased dosage. She also would like to start weight loss management.   Observations/Objective:   Assessment and Plan: 1. Anxiety state (Primary) Improved symporm.s Cymbalta  increased to 30 mg daily  2. Encounter for weight management Patient will start med but will contact compounding pharmacy to determine if this is something that she is wanting to do at this time   Follow Up Instructions:    I discussed the assessment and treatment plan with the patient. The patient was provided an opportunity to ask questions and all were answered. The patient agreed with the plan and demonstrated an understanding of the instructions.   The patient was advised to call back or seek an in-person evaluation if the symptoms worsen or if the condition fails to improve as anticipated.  I provided 13 minutes of non-face-to-face time during this encounter.   Tanda Raguel SQUIBB, MD

## 2024-02-12 ENCOUNTER — Ambulatory Visit (INDEPENDENT_AMBULATORY_CARE_PROVIDER_SITE_OTHER): Admitting: Orthopaedic Surgery

## 2024-02-12 DIAGNOSIS — M545 Low back pain, unspecified: Secondary | ICD-10-CM | POA: Diagnosis not present

## 2024-02-12 NOTE — Progress Notes (Signed)
 Chief Complaint: Right worse than left leg pain     History of Present Illness:   02/12/2024: Presents today for follow-up of her leg pain as well as an MRI lumbar spine discussion  [name] is a 53 y.o. female presents with ongoing lower back pain that does radiate to the right worse than the left.  She is status post a L4-L5 fusion in 2021 with Dr. Beuford.  She did do extremely well with resolution of her paresthesias although unfortunately she has had a recurrence of radiating pain and numbness down the right leg as well as the left leg to a lesser extent.  She has been on Cymbalta for this.  She has trialed the prednisone  with some limited relief although her numbness is progressing now prickly on the right side    PMH/PSH/Family History/Social History/Meds/Allergies:    Past Medical History:  Diagnosis Date   Anemia    Arthritis    CAD in native artery 05/27/2023   Depression    due to perimenopausal phase per pt.   Essential hypertension 08/30/2022   GERD (gastroesophageal reflux disease)    History of hiatal hernia    Postpartum care following cesarean delivery (4/27) 07/28/2015   Pregnancy induced hypertension    during pregnancy   Sleep apnea    no machine   Varicose veins    Vitamin D deficiency    Past Surgical History:  Procedure Laterality Date   ABDOMINAL EXPOSURE N/A 04/27/2019   Procedure: ABDOMINAL EXPOSURE;  Surgeon: Eliza Lonni RAMAN, MD;  Location: [facility] OR;  Service: Vascular;  Laterality: N/A;   ANTERIOR LUMBAR FUSION N/A 04/27/2019   Procedure: ANTERIOR LUMBAR INTERBODY FUSION LUMBAR FOUR-FIVE WITH INSTRUMENTATION AND ALLOGRAFT;  Surgeon: Beuford Anes, MD;  Location: [facility] OR;  Service: Orthopedics;  Laterality: N/A;   CESAREAN SECTION N/A 07/28/2015   Procedure: CESAREAN SECTION;  Surgeon: Charlie Flowers, MD;  Location: [facility] ORS;  Service: Obstetrics;  Laterality: N/A;   ESOPHAGEAL MANOMETRY N/A 01/02/2017   Procedure: ESOPHAGEAL MANOMETRY  (EM);  Surgeon: Saintclair Jasper, MD;  Location: [facility];  Service: Gastroenterology;  Laterality: N/A;   HERNIA REPAIR     hiatal hernia repair 03-06-17   INSERTION OF MESH N/A 03/06/2017   Procedure: INSERTION OF MESH;  Surgeon: Rubin Calamity, MD;  Location: [facility] ORS;  Service: General;  Laterality: N/A;   KNEE SURGERY Bilateral    R knee nov 2024, L knee aug 2024   TOE SURGERY Bilateral    Social History   Socioeconomic History   Marital status: Married    Spouse name: Not on file   Number of children: Not on file   Years of education: Not on file   Highest education level: 12th grade  Occupational History   Not on file  Tobacco Use   Smoking status: Former    Current packs/day: 0.00    Types: Cigarettes    Quit date: 03/06/2011    Years since quitting: 12.9    Passive exposure: Past   Smokeless tobacco: Never  Vaping Use   Vaping status: Never Used  Substance and Sexual Activity   Alcohol use: No    Alcohol/week: 0.0 standard drinks of alcohol   Drug use: No   Sexual activity: Yes  Other Topics Concern   Not on file  Social History Narrative   ** Merged History Encounter **       Social Drivers of Health   Financial Resource Strain: Medium Risk (02/11/2024)  Overall Financial Resource Strain (CARDIA)    Difficulty of Paying Living Expenses: Somewhat hard  Food Insecurity: No Food Insecurity (02/11/2024)   Hunger Vital Sign    Worried About Running Out of Food in the Last Year: Never true    Ran Out of Food in the Last Year: Never true  Transportation Needs: No Transportation Needs (02/11/2024)   PRAPARE - Administrator, Civil Service (Medical): No    Lack of Transportation (Non-Medical): No  Physical Activity: Insufficiently Active (02/11/2024)   Exercise Vital Sign    Days of Exercise per Week: 2 days    Minutes of Exercise per Session: 20 min  Stress: No Stress Concern Present (02/11/2024)   [employer] of Occupational Health -  Occupational Stress Questionnaire    Feeling of Stress: Not at all  Social Connections: Socially Integrated (02/11/2024)   Social Connection and Isolation Panel    Frequency of Communication with Friends and Family: Three times a week    Frequency of Social Gatherings with Friends and Family: Once a week    Attends Religious Services: More than 4 times per year    Active Member of [employer] or Organizations: Yes    Attends Engineer, Structural: More than 4 times per year    Marital Status: Married   Family History  Problem Relation Age of Onset   Heart attack Mother 66   Heart disease Mother    Diabetes Mother    Hypertension Mother    Cancer Father    Hypertension Father    Hypertension Sister    Asthma Sister    Seizures Sister    Bipolar disorder Sister    Hypertension Brother    Allergies  Allergen Reactions   Aleve [Naproxen] Shortness Of Breath   Excedrin Tension Headache [Acetaminophen -Caffeine] Shortness Of Breath   Ibuprofen  Anaphylaxis   Tramadol Nausea And Vomiting and Swelling   Current Outpatient Medications  Medication Sig Dispense Refill   aspirin  EC 81 MG tablet Take 1 tablet (81 mg total) by mouth daily. Swallow whole. 90 tablet 3   cetirizine (ZYRTEC) 10 MG tablet Take 10 mg by mouth daily as needed for allergies (alternating with Allegra).     cyclobenzaprine  (FLEXERIL ) 10 MG tablet Take 1 tablet (10 mg total) by mouth at bedtime. (Patient not taking: Reported on 12/31/2023) 10 tablet 0   DULoxetine (CYMBALTA) 20 MG capsule Take 1 capsule (20 mg total) by mouth daily. 30 capsule 1   DULoxetine (CYMBALTA) 30 MG capsule Take 1 capsule (30 mg total) by mouth daily. 30 capsule 1   fexofenadine (ALLEGRA) 60 MG tablet Take 60 mg by mouth daily as needed for allergies or rhinitis (alternating with Zyrtec).     hydrochlorothiazide  (HYDRODIURIL ) 25 MG tablet Take 1 tablet (25 mg total) by mouth daily. 90 tablet 3   linaclotide (LINZESS) 290 MCG CAPS capsule take  orally 1 PO 30 minutes before the first meal of the day; Duration: 90 days (Patient not taking: Reported on 12/31/2023)     meloxicam  (MOBIC ) 15 MG tablet Take 1 tablet (15 mg total) by mouth daily. 90 tablet 1   pantoprazole  (PROTONIX ) 40 MG tablet Take 1 tablet (40 mg total) by mouth daily. 90 tablet 3   predniSONE  (STERAPRED UNI-PAK 21 TAB) 10 MG (21) TBPK tablet Take by mouth daily. Take 6 tabs by mouth daily  for 2 days, then 5 tabs for 2 days, then 4 tabs for 2 days, then 3 tabs for  2 days, 2 tabs for 2 days, then 1 tab by mouth daily for 2 days (Patient not taking: Reported on 12/31/2023) 42 tablet 0   rosuvastatin  (CRESTOR ) 40 MG tablet Take 1 tablet (40 mg total) by mouth daily. 90 tablet 3   valsartan  (DIOVAN ) 80 MG tablet Take 1 tablet (80 mg total) by mouth daily. 90 tablet 3   No current facility-administered medications for this visit.   No results found.  Review of Systems:   A ROS was performed including pertinent positives and negatives as documented in the HPI.  Physical Exam :   Constitutional: NAD and appears stated age Neurological: Alert and oriented Psych: Appropriate affect and cooperative There were no vitals taken for this visit.   Comprehensive Musculoskeletal Exam:    Positive straight leg raise on the right with radiating pain.  There is pain about the back which does radiate.  This is worse with forward flexion of the trunk.  Walks with a mildly antalgic gait favoring the right side.  No other weakness involving the muscle groups of the lower extremities   Imaging:   Xray (4 views lumbar spine): Status post L4-L5 fusion without evidence of complication there is adjacent segment disease above and below     I personally reviewed and interpreted the radiographs.   Assessment and Plan:   53 y.o. 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  Unfortunately her MRI today does show evidence of particularly  right-sided L5-S1 nerve root impingement for which she is having symptoms.  At this time she has trialed physical therapy in the past.  She has trialed anti-inflammatories.  I will plan to send her for an epidural injection but I do believe she would also benefit from a spinal consultation  - Plan for referral to Dr. Georgina for discussion of possible spinal intervention   I personally saw and evaluated the patient, and participated in the management and treatment plan.  Elspeth Parker, MD Attending Physician, Orthopedic Surgery  This document was dictated using Dragon voice recognition software. A reasonable attempt at proof reading has been made to minimize errors.

## 2024-02-20 ENCOUNTER — Telehealth: Payer: Self-pay

## 2024-02-20 NOTE — Telephone Encounter (Signed)
 Copied from CRM #[id number]. Topic: Clinical - Medication Question >> Feb 20, 2024 11:45 AM Shanda MATSU wrote: Reason for CRM: Patient called in to adv provider that she would like weight loss med that she and provider discussed called into pharmacy, [facility]  Phone #: [phone], is req a call back once this is completed.

## 2024-02-24 ENCOUNTER — Encounter: Payer: Self-pay | Admitting: Family Medicine

## 2024-02-24 NOTE — Discharge Instructions (Signed)

## 2024-02-25 ENCOUNTER — Inpatient Hospital Stay
Admission: RE | Admit: 2024-02-25 | Discharge: 2024-02-25 | Disposition: A | Source: Ambulatory Visit | Attending: Orthopaedic Surgery

## 2024-02-25 ENCOUNTER — Encounter: Payer: Self-pay | Admitting: Family Medicine

## 2024-02-25 DIAGNOSIS — M545 Low back pain, unspecified: Secondary | ICD-10-CM

## 2024-02-25 MED ORDER — METHYLPREDNISOLONE ACETATE 40 MG/ML INJ SUSP (RADIOLOG
80.0000 mg | Freq: Once | INTRAMUSCULAR | Status: AC
  Administered 2024-02-25: 80 mg via EPIDURAL

## 2024-02-25 MED ORDER — IOPAMIDOL (ISOVUE-M 200) INJECTION 41%
1.0000 mL | Freq: Once | INTRAMUSCULAR | Status: AC
  Administered 2024-02-25: 1 mL via EPIDURAL

## 2024-03-12 ENCOUNTER — Ambulatory Visit: Admitting: Family Medicine

## 2024-03-16 ENCOUNTER — Encounter: Payer: Self-pay | Admitting: Family Medicine

## 2024-03-19 ENCOUNTER — Ambulatory Visit: Admitting: Orthopedic Surgery

## 2024-03-19 VITALS — BP 110/76 | HR 80 | Ht 67.0 in | Wt 258.0 lb

## 2024-03-19 DIAGNOSIS — M5416 Radiculopathy, lumbar region: Secondary | ICD-10-CM | POA: Diagnosis not present

## 2024-03-19 MED ORDER — PREGABALIN 100 MG PO CAPS: 100.0000 mg | ORAL_CAPSULE | Freq: Two times a day (BID) | ORAL | 2 refills | Status: AC

## 2024-03-19 NOTE — Progress Notes (Signed)
 Orthopedic Spine Surgery Office Note  Assessment: Patient is a 53 y.o. female with low back pain that radiates along her posterior thighs and legs bilaterally (R>L). 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 Discussed that the conservative treatments include:  -activity modification  -physical therapy  -over the counter pain medications  -medrol  dosepak  -lumbar steroid injections -Patient has tried ibuprofen , Cymbalta , lumbar steroid injection -Recommended Lyrica  which was prescribed to her today -She has an upcoming revision knee arthroplasty, so would hold off on any surgical intervention until she recovers from that -Patient should return to office in 3 months, x-rays at next visit: None   Patient expressed understanding of the plan and all questions were answered to the patient's satisfaction.   ___________________________________________________________________________   History:  Patient is a 53 y.o. female who presents today for lumbar spine.  Patient initially underwent surgery with Dr. Beuford in 2021.  She had an L4/5 ALIF and PSIF.  She did well after the surgery.  Within the last 6 months, she has developed pain in her low back going into her bilateral lower extremities.  She feels the going along the posterior aspect of the thighs and legs.  The right side is more affected than the left.  There was no trauma or injury that preceded the onset of the pain.  She feels the pain on a daily basis.  She has tried several treatments but has not noticed any relief with those treatments.   Weakness: Denies Symptoms of imbalance: Denies Paresthesias and numbness: Denies Bowel or bladder incontinence: Denies Saddle anesthesia: Denies  Treatments tried:  Ibuprofen , Cymbalta , lumbar steroid injection  Review of systems: Denies fevers and chills, night sweats, unexplained weight loss, history of cancer.has had pain that wakes her at night  Past medical history: HTN HLD GERD OSA CAD  Allergies: aleve, tramadol, excedrin  Past surgical history:  L4/5 ALIF and PSIF Cesarean section Hernia repair Bilateral TKA  Social history: Denies use of nicotine product (smoking, vaping, patches, smokeless) Alcohol use: Denies Denies recreational drug use   Physical Exam:  BMI of 40.4  General: no acute distress, appears stated age Neurologic: alert, answering questions appropriately, following commands Respiratory: unlabored breathing on room air, symmetric chest rise Psychiatric: appropriate affect, normal cadence to speech   MSK (spine):  -Strength exam      Left  Right EHL    5/5  5/5 TA    5/5  5/5 GSC    5/5  5/5 Knee extension  5/5  5/5 Hip flexion   5/5  5/5  -Sensory exam    Sensation intact to light touch in L3-S1 nerve distributions of bilateral lower extremities  -Achilles DTR: 1/4 on the left, 1/4 on the right -Patellar tendon DTR: 1/4 on the left, 1/4 on the right  -Straight leg raise: Negative bilaterally -Clonus: no beats bilaterally  -Left hip exam: No pain through range of motion, negative Stinchfield, negative FABER -Right hip exam: No pain through range of motion, negative Stinchfield, negative FABER  Imaging: XRs of the lumbar spine from 01/10/2024 were independently reviewed and interpreted, showing interbody device in the L4/5 disc space.  There appears to be fusion mass across the former disc space.  Posterior instrumentation at L4 and L5.  No lucency seen around  the screws.  There is a screw with a washer near the anterior aspect of the interbody device.  No evidence of the screw has backed out.  Disc height loss at L5/S1 with vacuum disc phenomenon.  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  The disc nation is larger on the right side.  Bilateral foraminal stenosis at L3/4.  No other significant stenosis seen.   Patient name: [name] Patient MRN: [id number] Date of visit: 03/19/2024

## 2024-04-04 ENCOUNTER — Encounter (HOSPITAL_BASED_OUTPATIENT_CLINIC_OR_DEPARTMENT_OTHER): Payer: Self-pay | Admitting: Cardiovascular Disease

## 2024-04-06 ENCOUNTER — Other Ambulatory Visit: Payer: Self-pay | Admitting: Medical Genetics

## 2024-04-06 ENCOUNTER — Other Ambulatory Visit (HOSPITAL_BASED_OUTPATIENT_CLINIC_OR_DEPARTMENT_OTHER): Payer: Self-pay

## 2024-04-06 MED ORDER — ROSUVASTATIN CALCIUM 40 MG PO TABS
40.0000 mg | ORAL_TABLET | Freq: Every day | ORAL | 3 refills | Status: DC
  Filled 2024-04-06: qty 30, 30d supply, fill #0

## 2024-04-06 MED ORDER — VALSARTAN 80 MG PO TABS
80.0000 mg | ORAL_TABLET | Freq: Every day | ORAL | 3 refills | Status: DC
  Filled 2024-04-06: qty 90, 90d supply, fill #0

## 2024-04-08 ENCOUNTER — Other Ambulatory Visit (HOSPITAL_BASED_OUTPATIENT_CLINIC_OR_DEPARTMENT_OTHER): Payer: Self-pay

## 2024-04-08 ENCOUNTER — Other Ambulatory Visit (HOSPITAL_BASED_OUTPATIENT_CLINIC_OR_DEPARTMENT_OTHER): Payer: Self-pay | Admitting: *Deleted

## 2024-04-08 MED ORDER — ROSUVASTATIN CALCIUM 40 MG PO TABS: 40.0000 mg | ORAL_TABLET | Freq: Every day | ORAL | 0 refills | Status: AC

## 2024-04-08 MED ORDER — VALSARTAN 80 MG PO TABS: 80.0000 mg | ORAL_TABLET | Freq: Every day | ORAL | 0 refills | Status: AC

## 2024-04-09 ENCOUNTER — Ambulatory Visit: Admitting: Family Medicine

## 2024-04-13 ENCOUNTER — Encounter: Payer: Self-pay | Admitting: Family Medicine

## 2024-04-13 ENCOUNTER — Ambulatory Visit: Admitting: Family Medicine

## 2024-04-13 VITALS — BP 110/71 | HR 79 | Ht 67.0 in | Wt 266.8 lb

## 2024-04-13 DIAGNOSIS — M25561 Pain in right knee: Secondary | ICD-10-CM | POA: Diagnosis not present

## 2024-04-13 DIAGNOSIS — M51362 Other intervertebral disc degeneration, lumbar region with discogenic back pain and lower extremity pain: Secondary | ICD-10-CM

## 2024-04-13 DIAGNOSIS — Z7689 Persons encountering health services in other specified circumstances: Secondary | ICD-10-CM

## 2024-04-13 DIAGNOSIS — E66813 Obesity, class 3: Secondary | ICD-10-CM | POA: Diagnosis not present

## 2024-04-13 DIAGNOSIS — Z6841 Body Mass Index (BMI) 40.0 and over, adult: Secondary | ICD-10-CM | POA: Diagnosis not present

## 2024-04-13 DIAGNOSIS — I10 Essential (primary) hypertension: Secondary | ICD-10-CM

## 2024-04-13 DIAGNOSIS — E785 Hyperlipidemia, unspecified: Secondary | ICD-10-CM

## 2024-04-13 NOTE — Progress Notes (Unsigned)
 "  Established Patient Office Visit  Subjective    Patient ID: [name], female    DOB: 04-29-1970  Age: 54 y.o. MRN: [id number]  CC:  Chief Complaint  Patient presents with   Medical Management of Chronic Issues    HPI [name] presents for hypertension and weight loss. 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   Outpatient Encounter Medications as of 04/13/2024  Medication Sig   aspirin  EC 81 MG tablet Take 1 tablet (81 mg total) by mouth daily. Swallow whole.   cetirizine (ZYRTEC) 10 MG tablet Take 10 mg by mouth daily as needed for allergies (alternating with Allegra).   DULoxetine  (CYMBALTA ) 30 MG capsule Take 1 capsule (30 mg total) by mouth daily.   hydrochlorothiazide  (HYDRODIURIL ) 25 MG tablet Take 1 tablet (25 mg total) by mouth daily.   meloxicam  (MOBIC ) 15 MG tablet Take 1 tablet (15 mg total) by mouth daily.   pantoprazole  (PROTONIX ) 40 MG tablet Take 1 tablet (40 mg total) by mouth daily.   rosuvastatin  (CRESTOR ) 40 MG tablet Take 1 tablet (40 mg total) by mouth daily.   valsartan  (DIOVAN ) 80 MG tablet Take 1 tablet (80 mg total) by mouth daily.   cyclobenzaprine  (FLEXERIL ) 10 MG tablet Take 1 tablet (10 mg total) by mouth at bedtime. (Patient not taking: Reported on 12/31/2023)   DULoxetine  (CYMBALTA ) 20 MG capsule Take 1 capsule (20 mg total) by mouth daily. (Patient not taking: Reported on 04/13/2024)   fexofenadine (ALLEGRA) 60 MG tablet Take 60 mg by mouth daily as needed for allergies or rhinitis (alternating with Zyrtec). (Patient not taking: Reported on 04/13/2024)   linaclotide (LINZESS) 290 MCG CAPS capsule take orally 1 PO 30 minutes before the first meal of the day; Duration: 90 days (Patient not taking: Reported on 12/31/2023)   predniSONE  (STERAPRED UNI-PAK 21 TAB) 10 MG (21) TBPK tablet Take by mouth daily. Take 6 tabs by mouth daily  for 2 days, then 5 tabs for 2 days, then 4  tabs for 2 days, then 3 tabs for 2 days, 2 tabs for 2 days, then 1 tab by mouth daily for 2 days (Patient not taking: Reported on 12/31/2023)   pregabalin  (LYRICA ) 100 MG capsule Take 1 capsule (100 mg total) by mouth 2 (two) times daily. (Patient not taking: Reported on 04/13/2024)   No facility-administered encounter medications on file as of 04/13/2024.    Past Medical History:  Diagnosis Date   Anemia    Arthritis    CAD in native artery 05/27/2023   Depression    due to perimenopausal phase per pt.   Essential hypertension 08/30/2022   GERD (gastroesophageal reflux disease)    History of hiatal hernia    Postpartum care following cesarean delivery (4/27) 07/28/2015   Pregnancy induced hypertension    during pregnancy   Sleep apnea    no machine   Varicose veins    Vitamin D deficiency     Past Surgical History:  Procedure Laterality Date   ABDOMINAL EXPOSURE N/A 04/27/2019   Procedure: ABDOMINAL EXPOSURE;  Surgeon: Eliza Lonni RAMAN, MD;  [facility];  Service: Vascular;  Laterality: N/A;   ANTERIOR LUMBAR FUSION N/A 04/27/2019   Procedure: ANTERIOR LUMBAR INTERBODY FUSION LUMBAR FOUR-FIVE WITH INSTRUMENTATION AND ALLOGRAFT;  Surgeon: Beuford Anes, MD;  Location: [facility] OR;  Service: Orthopedics;  Laterality: N/A;   CESAREAN SECTION N/A 07/28/2015  Procedure: CESAREAN SECTION;  Surgeon: Charlie Flowers, MD;  Location: [facility] ORS;  Service: Obstetrics;  Laterality: N/A;   ESOPHAGEAL MANOMETRY N/A 01/02/2017   Procedure: ESOPHAGEAL MANOMETRY (EM);  Surgeon: Saintclair Jasper, MD;  Location: [facility] ENDOSCOPY;  Service: Gastroenterology;  Laterality: N/A;   HERNIA REPAIR     hiatal hernia repair 03-06-17   INSERTION OF MESH N/A 03/06/2017   Procedure: INSERTION OF MESH;  Surgeon: Rubin Calamity, MD;  Location: [facility] ORS;  Service: General;  Laterality: N/A;   KNEE SURGERY Bilateral    R knee nov 2024, L knee aug 2024   TOE SURGERY Bilateral     Family History  Problem Relation Age  of Onset   Heart attack Mother 43   Heart disease Mother    Diabetes Mother    Hypertension Mother    Cancer Father    Hypertension Father    Hypertension Sister    Asthma Sister    Seizures Sister    Bipolar disorder Sister    Hypertension Brother     Social History   Socioeconomic History   Marital status: Married    Spouse name: Not on file   Number of children: Not on file   Years of education: Not on file   Highest education level: 12th grade  Occupational History   Not on file  Tobacco Use   Smoking status: Former    Current packs/day: 0.00    Types: Cigarettes    Quit date: 03/06/2011    Years since quitting: 13.1    Passive exposure: Past   Smokeless tobacco: Never  Vaping Use   Vaping status: Never Used  Substance and Sexual Activity   Alcohol use: No    Alcohol/week: 0.0 standard drinks of alcohol   Drug use: No   Sexual activity: Yes  Other Topics Concern   Not on file  Social History Narrative   ** Merged History Encounter **       Social Drivers of Health   Tobacco Use: Medium Risk (04/13/2024)   Patient History    Smoking Tobacco Use: Former    Smokeless Tobacco Use: Never    Passive Exposure: Past  Physicist, Medical Strain: Medium Risk (02/11/2024)   Overall Financial Resource Strain (CARDIA)    Difficulty of Paying Living Expenses: Somewhat hard  Food Insecurity: No Food Insecurity (02/11/2024)   Epic    Worried About Programme Researcher, Broadcasting/film/video in the Last Year: Never true    Ran Out of Food in the Last Year: Never true  Transportation Needs: No Transportation Needs (02/11/2024)   Epic    Lack of Transportation (Medical): No    Lack of Transportation (Non-Medical): No  Physical Activity: Insufficiently Active (02/11/2024)   Exercise Vital Sign    Days of Exercise per Week: 2 days    Minutes of Exercise per Session: 20 min  Stress: No Stress Concern Present (02/11/2024)   [employer] of Occupational Health - Occupational Stress  Questionnaire    Feeling of Stress: Not at all  Social Connections: Socially Integrated (02/11/2024)   Social Connection and Isolation Panel    Frequency of Communication with Friends and Family: Three times a week    Frequency of Social Gatherings with Friends and Family: Once a week    Attends Religious Services: More than 4 times per year    Active Member of [employer] or Organizations: Yes    Attends Banker Meetings: More than 4 times per year    Marital  Status: Married  Catering Manager Violence: Not At Risk (01/08/2023)   Humiliation, Afraid, Rape, and Kick questionnaire    Fear of Current or Ex-Partner: No    Emotionally Abused: No    Physically Abused: No    Sexually Abused: No  Depression (PHQ2-9): Low Risk (06/25/2023)   Depression (PHQ2-9)    PHQ-2 Score: 0  Alcohol Screen: Low Risk (08/30/2022)   Alcohol Screen    Last Alcohol Screening Score (AUDIT): 0  Housing: High Risk (02/11/2024)   Epic    Unable to Pay for Housing in the Last Year: Yes    Number of Times Moved in the Last Year: Not on file    Homeless in the Last Year: No  Utilities: Not At Risk (01/08/2023)   AHC Utilities    Threatened with loss of utilities: No  Health Literacy: Adequate Health Literacy (01/08/2023)   B1300 Health Literacy    Frequency of need for help with medical instructions: Never    Review of Systems  All other systems reviewed and are negative.       Objective    BP 110/71   Pulse 79   Ht 5' 7 (1.702 m)   Wt 266 lb 12.8 oz (121 kg)   LMP  (LMP Unknown) Comment: 3 months ago  SpO2 97%   BMI 41.79 kg/m   Physical Exam Vitals and nursing note reviewed.  Constitutional:      General: She is not in acute distress.    Appearance: She is obese.  Cardiovascular:     Rate and Rhythm: Normal rate and regular rhythm.  Pulmonary:     Effort: Pulmonary effort is normal.     Breath sounds: Normal breath sounds.  Abdominal:     Palpations: Abdomen is soft.      Tenderness: There is no abdominal tenderness.  Musculoskeletal:     Right knee: Swelling present. No deformity. Decreased range of motion. Tenderness present.  Neurological:     General: No focal deficit present.     Mental Status: She is alert and oriented to person, place, and time.         Assessment & Plan:   1. Right knee pain, unspecified chronicity (Primary) Management as per consultant. Plan procedure/replacement in Fe  2. Essential hypertension Appears stable. continue  3. Encounter for weight management Patient is losing weight with nutritionist and wegovy. Patient to consider switch to zepbound   4. Class 3 severe obesity due to excess calories with serious comorbidity and body mass index (BMI) of 40.0 to 44.9 in adult [facility]) As above  5. Hyperlipidemia, unspecified hyperlipidemia type Continue   6. Degeneration of intervertebral disc of lumbar region with discogenic back pain and lower extremity pain Management as per consultant    Return in about 4 weeks (around 05/11/2024) for follow up.   Tanda Raguel SQUIBB, MD  "

## 2024-04-14 ENCOUNTER — Encounter: Payer: Self-pay | Admitting: Family Medicine

## 2024-04-16 ENCOUNTER — Other Ambulatory Visit: Payer: Self-pay | Admitting: Family Medicine

## 2024-04-23 ENCOUNTER — Ambulatory Visit: Admitting: Family Medicine

## 2024-04-28 ENCOUNTER — Telehealth: Admitting: Physician Assistant

## 2024-04-28 DIAGNOSIS — J069 Acute upper respiratory infection, unspecified: Secondary | ICD-10-CM | POA: Diagnosis not present

## 2024-04-28 MED ORDER — BENZONATATE 100 MG PO CAPS: 100.0000 mg | ORAL_CAPSULE | Freq: Three times a day (TID) | ORAL | 0 refills | Status: AC | PRN

## 2024-04-28 MED ORDER — FLUTICASONE PROPIONATE 50 MCG/ACT NA SUSP: 2.0000 | Freq: Every day | NASAL | 0 refills | Status: AC

## 2024-04-28 NOTE — Progress Notes (Signed)

## 2024-04-30 ENCOUNTER — Telehealth: Admitting: Emergency Medicine

## 2024-04-30 DIAGNOSIS — R051 Acute cough: Secondary | ICD-10-CM | POA: Diagnosis not present

## 2024-04-30 MED ORDER — AMOXICILLIN-POT CLAVULANATE 875-125 MG PO TABS: 1.0000 | ORAL_TABLET | Freq: Two times a day (BID) | ORAL | 0 refills | Status: AC

## 2024-04-30 MED ORDER — DEXTROMETHORPHAN POLISTIREX ER 30 MG/5ML PO SUER: 30.0000 mg | ORAL | 0 refills | Status: AC | PRN

## 2024-04-30 MED ORDER — ALBUTEROL SULFATE HFA 108 (90 BASE) MCG/ACT IN AERS: 2.0000 | INHALATION_SPRAY | RESPIRATORY_TRACT | 3 refills | Status: AC | PRN

## 2024-04-30 NOTE — Patient Instructions (Signed)
 " Lamont Novak, thank you for joining Lamar Schlossman, PA-C for today's virtual visit.  While this provider is not your primary care provider (PCP), if your PCP is located in our provider database this encounter information will be shared with them immediately following your visit.   A [facility] [facility] MyChart account gives you access to today's visit and all your visits, tests, and labs performed at [facility]  click here if you don't have a [facility] [place] MyChart account or go to [email or web address]  Consent: (Patient) [name] provided verbal consent for this virtual visit at the beginning of the encounter.  Current Medications:  Current Outpatient Medications:    albuterol  (VENTOLIN  HFA) 108 (90 Base) MCG/ACT inhaler, Inhale 2 puffs into the lungs every 4 (four) hours as needed for wheezing or shortness of breath., Disp: 1 each, Rfl: 3   amoxicillin -clavulanate (AUGMENTIN ) 875-125 MG tablet, Take 1 tablet by mouth every 12 (twelve) hours., Disp: 14 tablet, Rfl: 0   dextromethorphan  (DELSYM ) 30 MG/5ML liquid, Take 5 mLs (30 mg total) by mouth as needed for cough., Disp: 89 mL, Rfl: 0   aspirin  EC 81 MG tablet, Take 1 tablet (81 mg total) by mouth daily. Swallow whole., Disp: 90 tablet, Rfl: 3   benzonatate  (TESSALON ) 100 MG capsule, Take 1-2 capsules (100-200 mg total) by mouth 3 (three) times daily as needed., Disp: 30 capsule, Rfl: 0   cetirizine (ZYRTEC) 10 MG tablet, Take 10 mg by mouth daily as needed for allergies (alternating with Allegra)., Disp: , Rfl:    cyclobenzaprine  (FLEXERIL ) 10 MG tablet, Take 1 tablet (10 mg total) by mouth at bedtime. (Patient not taking: Reported on 12/31/2023), Disp: 10 tablet, Rfl: 0   DULoxetine  (CYMBALTA ) 20 MG capsule, Take 1 capsule (20 mg total) by mouth daily. (Patient not taking: Reported on 04/13/2024), Disp: 30 capsule, Rfl: 1   DULoxetine  (CYMBALTA ) 30 MG capsule, Take 1 capsule (30 mg total) by mouth daily., Disp: 30 capsule, Rfl: 1    fexofenadine (ALLEGRA) 60 MG tablet, Take 60 mg by mouth daily as needed for allergies or rhinitis (alternating with Zyrtec). (Patient not taking: Reported on 04/13/2024), Disp: , Rfl:    fluticasone  (FLONASE ) 50 MCG/ACT nasal spray, Place 2 sprays into both nostrils daily., Disp: 16 g, Rfl: 0   hydrochlorothiazide  (HYDRODIURIL ) 25 MG tablet, Take 1 tablet (25 mg total) by mouth daily., Disp: 90 tablet, Rfl: 3   linaclotide (LINZESS) 290 MCG CAPS capsule, take orally 1 PO 30 minutes before the first meal of the day; Duration: 90 days (Patient not taking: Reported on 12/31/2023), Disp: , Rfl:    meloxicam  (MOBIC ) 15 MG tablet, TAKE 1 TABLET BY MOUTH DAILY, Disp: 90 tablet, Rfl: 0   pantoprazole  (PROTONIX ) 40 MG tablet, Take 1 tablet (40 mg total) by mouth daily., Disp: 90 tablet, Rfl: 3   predniSONE  (STERAPRED UNI-PAK 21 TAB) 10 MG (21) TBPK tablet, Take by mouth daily. Take 6 tabs by mouth daily  for 2 days, then 5 tabs for 2 days, then 4 tabs for 2 days, then 3 tabs for 2 days, 2 tabs for 2 days, then 1 tab by mouth daily for 2 days (Patient not taking: Reported on 12/31/2023), Disp: 42 tablet, Rfl: 0   pregabalin  (LYRICA ) 100 MG capsule, Take 1 capsule (100 mg total) by mouth 2 (two) times daily. (Patient not taking: Reported on 04/13/2024), Disp: 60 capsule, Rfl: 2   rosuvastatin  (CRESTOR ) 40 MG tablet, Take 1 tablet (40 mg total) by mouth  daily., Disp: 90 tablet, Rfl: 0   valsartan  (DIOVAN ) 80 MG tablet, Take 1 tablet (80 mg total) by mouth daily., Disp: 90 tablet, Rfl: 0   Medications ordered in this encounter:  Meds ordered this encounter  Medications   amoxicillin -clavulanate (AUGMENTIN ) 875-125 MG tablet    Sig: Take 1 tablet by mouth every 12 (twelve) hours.    Dispense:  14 tablet    Refill:  0    Supervising Provider:   BLAISE ALEENE KIDD [[id number]]   dextromethorphan  (DELSYM ) 30 MG/5ML liquid    Sig: Take 5 mLs (30 mg total) by mouth as needed for cough.    Dispense:  89 mL    Refill:  0     Supervising Provider:   BLAISE ALEENE KIDD [[id number]]   albuterol  (VENTOLIN  HFA) 108 (90 Base) MCG/ACT inhaler    Sig: Inhale 2 puffs into the lungs every 4 (four) hours as needed for wheezing or shortness of breath.    Dispense:  1 each    Refill:  3    Supervising Provider:   BLAISE ALEENE KIDD [phone]     *If you need refills on other medications prior to your next appointment, please contact your pharmacy*  Follow-Up: Call back or seek an in-person evaluation if the symptoms worsen or if the condition fails to improve as anticipated.  [facility] [phone]  Other Instructions    If you have been instructed to have an in-person evaluation today at a local Urgent Care facility, please use the link below. It will take you to a list of all of our available [facility], including address, phone number and hours of operation. Please do not delay care.  [facility]  If you or a family member do not have a primary care provider, use the link below to schedule a visit and establish care. When you choose a [facility] primary care physician or advanced practice provider, you gain a long-term partner in health. Find a Primary Care Provider  Learn more about [facility]'s in-office and virtual care options: [facility] - Get Care Now  "

## 2024-04-30 NOTE — Progress Notes (Signed)
 " Virtual Visit Consent   [name], you are scheduled for a virtual visit with a [facility] provider today. Just as with appointments in the office, your consent must be obtained to participate. Your consent will be active for this visit and any virtual visit you may have with one of our providers in the next 365 days. If you have a MyChart account, a copy of this consent can be sent to you electronically.  As this is a virtual visit, video technology does not allow for your provider to perform a traditional examination. This may limit your provider's ability to fully assess your condition. If your provider identifies any concerns that need to be evaluated in person or the need to arrange testing (such as labs, EKG, etc.), we will make arrangements to do so. Although advances in technology are sophisticated, we cannot ensure that it will always work on either your end or our end. If the connection with a video visit is poor, the visit may have to be switched to a telephone visit. With either a video or telephone visit, we are not always able to ensure that we have a secure connection.  By engaging in this virtual visit, you consent to the provision of healthcare and authorize for your insurance to be billed (if applicable) for the services provided during this visit. Depending on your insurance coverage, you may receive a charge related to this service.  I need to obtain your verbal consent now. Are you willing to proceed with your visit today? [name] has provided verbal consent on 04/30/2024 for a virtual visit (video or telephone). Lamar Schlossman, PA-C  Date: 04/30/2024 1:40 PM   Virtual Visit via Video Note   I, Lamar Schlossman, connected with  [name]  ([id number], 1970/11/30) on 04/30/24 at  1:45 PM EST by a video-enabled telemedicine application and verified that I am speaking with the correct person using two identifiers.  Location: Patient: Virtual Visit Location Patient:  Home Provider: Virtual Visit Location Provider: Home Office   I discussed the limitations of evaluation and management by telemedicine and the availability of in person appointments. The patient expressed understanding and agreed to proceed.    History of Present Illness: [name] is a 54 y.o. who identifies as a female who was assigned female at birth, and is being seen today for cough and wheezing.  States that she has been sick for about 6 days.  States that she has tried OTC meds without much relief.  States that she has headache.  Reports SOB and heavy breathing and wheezing.  Denies fever or chills.  States that she has been fatigued.  Denies hx of asthma or COPD.  States that she has had a productive cough with dark green mucous.  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  Resting comfortably at home.  Head is normocephalic, atraumatic.  No labored breathing.  Speech  is clear and coherent with logical content.  Patient is alert and oriented at baseline.    Assessment and Plan: 1. Acute cough (Primary)   Meds ordered this encounter  Medications   amoxicillin -clavulanate (AUGMENTIN ) 875-125 MG tablet    Sig: Take 1 tablet by mouth every 12 (twelve) hours.    Dispense:  14 tablet    Refill:  0    Supervising  Provider:   BLAISE ALEENE KIDD [[id number]]   dextromethorphan  (DELSYM ) 30 MG/5ML liquid    Sig: Take 5 mLs (30 mg total) by mouth as needed for cough.    Dispense:  89 mL    Refill:  0    Supervising Provider:   BLAISE ALEENE KIDD [[id number]]   albuterol  (VENTOLIN  HFA) 108 (90 Base) MCG/ACT inhaler    Sig: Inhale 2 puffs into the lungs every 4 (four) hours as needed for wheezing or shortness of breath.    Dispense:  1 each    Refill:  3    Supervising Provider:   BLAISE ALEENE KIDD [id number]     Follow Up Instructions: I discussed the assessment and treatment plan with the patient. The patient was provided an opportunity to ask questions and all were answered. The patient agreed with the plan and demonstrated an understanding of the instructions.  A copy of instructions were sent to the patient via MyChart unless otherwise noted below.     The patient was advised to call back or seek an in-person evaluation if the symptoms worsen or if the condition fails to improve as anticipated.    Lamar Schlossman, PA-C     [1]  Allergies Allergen Reactions   Aleve [Naproxen] Shortness Of Breath   Excedrin Tension Headache [Acetaminophen -Caffeine] Shortness Of Breath   Ibuprofen  Anaphylaxis   Tramadol Nausea And Vomiting and Swelling  [2]  Current Outpatient Medications:    albuterol  (VENTOLIN  HFA) 108 (90 Base) MCG/ACT inhaler, Inhale 2 puffs into the lungs every 4 (four) hours as needed for wheezing or shortness of breath., Disp: 1 each, Rfl: 3   amoxicillin -clavulanate (AUGMENTIN ) 875-125 MG tablet, Take 1 tablet by mouth every 12 (twelve) hours., Disp: 14 tablet, Rfl: 0   dextromethorphan  (DELSYM ) 30 MG/5ML liquid, Take 5 mLs (30 mg total) by mouth as needed for cough., Disp: 89 mL, Rfl: 0   aspirin  EC 81 MG tablet, Take 1 tablet (81 mg total) by mouth daily. Swallow whole., Disp: 90 tablet, Rfl: 3   benzonatate  (TESSALON ) 100 MG capsule, Take 1-2 capsules (100-200 mg total) by mouth 3 (three)  times daily as needed., Disp: 30 capsule, Rfl: 0   cetirizine (ZYRTEC) 10 MG tablet, Take 10 mg by mouth daily as needed for allergies (alternating with Allegra)., Disp: , Rfl:    cyclobenzaprine  (FLEXERIL ) 10 MG tablet, Take 1 tablet (10 mg total) by mouth at bedtime. (Patient not taking: Reported on 12/31/2023), Disp: 10 tablet, Rfl: 0   DULoxetine  (CYMBALTA ) 20 MG capsule, Take 1 capsule (20 mg total) by mouth daily. (Patient not taking: Reported on 04/13/2024), Disp: 30 capsule, Rfl: 1   DULoxetine  (CYMBALTA ) 30 MG capsule, Take 1 capsule (30 mg total) by mouth daily., Disp: 30 capsule, Rfl: 1   fexofenadine (ALLEGRA) 60 MG tablet, Take 60 mg by mouth daily as needed for allergies or rhinitis (alternating with Zyrtec). (Patient not taking: Reported on 04/13/2024), Disp: , Rfl:    fluticasone  (FLONASE ) 50 MCG/ACT nasal spray, Place 2 sprays into both nostrils daily., Disp: 16 g, Rfl: 0  hydrochlorothiazide  (HYDRODIURIL ) 25 MG tablet, Take 1 tablet (25 mg total) by mouth daily., Disp: 90 tablet, Rfl: 3   linaclotide (LINZESS) 290 MCG CAPS capsule, take orally 1 PO 30 minutes before the first meal of the day; Duration: 90 days (Patient not taking: Reported on 12/31/2023), Disp: , Rfl:    meloxicam  (MOBIC ) 15 MG tablet, TAKE 1 TABLET BY MOUTH DAILY, Disp: 90 tablet, Rfl: 0   pantoprazole  (PROTONIX ) 40 MG tablet, Take 1 tablet (40 mg total) by mouth daily., Disp: 90 tablet, Rfl: 3   predniSONE  (STERAPRED UNI-PAK 21 TAB) 10 MG (21) TBPK tablet, Take by mouth daily. Take 6 tabs by mouth daily  for 2 days, then 5 tabs for 2 days, then 4 tabs for 2 days, then 3 tabs for 2 days, 2 tabs for 2 days, then 1 tab by mouth daily for 2 days (Patient not taking: Reported on 12/31/2023), Disp: 42 tablet, Rfl: 0   pregabalin  (LYRICA ) 100 MG capsule, Take 1 capsule (100 mg total) by mouth 2 (two) times daily. (Patient not taking: Reported on 04/13/2024), Disp: 60 capsule, Rfl: 2   rosuvastatin  (CRESTOR ) 40 MG tablet, Take 1  tablet (40 mg total) by mouth daily., Disp: 90 tablet, Rfl: 0   valsartan  (DIOVAN ) 80 MG tablet, Take 1 tablet (80 mg total) by mouth daily., Disp: 90 tablet, Rfl: 0  "

## 2024-05-06 ENCOUNTER — Encounter: Payer: Self-pay | Admitting: Family Medicine

## 2024-05-07 ENCOUNTER — Other Ambulatory Visit: Payer: Self-pay | Admitting: Family Medicine

## 2024-05-07 MED ORDER — DULOXETINE HCL 30 MG PO CPEP: 30.0000 mg | ORAL_CAPSULE | Freq: Every day | ORAL | 1 refills | Status: AC

## 2024-05-14 ENCOUNTER — Ambulatory Visit: Admitting: Family Medicine

## 2024-05-22 ENCOUNTER — Encounter (HOSPITAL_BASED_OUTPATIENT_CLINIC_OR_DEPARTMENT_OTHER): Admitting: Cardiovascular Disease

## 2024-06-17 ENCOUNTER — Ambulatory Visit: Admitting: Orthopedic Surgery
